# Patient Record
Sex: Male | Born: 1940 | Race: White | Hispanic: No | Marital: Married | Smoking: Never smoker
Health system: Southern US, Community
[De-identification: ages and names within clinical notes are randomized; demographics above are authoritative.]

## PROBLEM LIST (undated history)

## (undated) DIAGNOSIS — N179 Acute kidney failure, unspecified: Secondary | ICD-10-CM

## (undated) DIAGNOSIS — C189 Malignant neoplasm of colon, unspecified: Secondary | ICD-10-CM

## (undated) DIAGNOSIS — I10 Essential (primary) hypertension: Secondary | ICD-10-CM

## (undated) DIAGNOSIS — I4891 Unspecified atrial fibrillation: Secondary | ICD-10-CM

## (undated) DIAGNOSIS — F1010 Alcohol abuse, uncomplicated: Secondary | ICD-10-CM

## (undated) DIAGNOSIS — D539 Nutritional anemia, unspecified: Secondary | ICD-10-CM

## (undated) DIAGNOSIS — F32A Depression, unspecified: Secondary | ICD-10-CM

## (undated) DIAGNOSIS — E785 Hyperlipidemia, unspecified: Secondary | ICD-10-CM

## (undated) DIAGNOSIS — I509 Heart failure, unspecified: Secondary | ICD-10-CM

## (undated) HISTORY — DX: Acute kidney failure, unspecified: N17.9

## (undated) HISTORY — PX: KNEE SURGERY: SHX244

## (undated) HISTORY — DX: Malignant neoplasm of colon, unspecified: C18.9

## (undated) HISTORY — DX: Heart failure, unspecified: I50.9

---

## 2017-09-30 ENCOUNTER — Encounter: Payer: Self-pay | Admitting: Emergency Medicine

## 2017-09-30 ENCOUNTER — Ambulatory Visit (INDEPENDENT_AMBULATORY_CARE_PROVIDER_SITE_OTHER)
Admission: EM | Admit: 2017-09-30 | Discharge: 2017-09-30 | Disposition: A | Discharge status: Home / Self Care | Payer: Medicare (Managed Care) | Source: Home / Self Care | Attending: Family Medicine | Admitting: Family Medicine

## 2017-09-30 ENCOUNTER — Ambulatory Visit (INDEPENDENT_AMBULATORY_CARE_PROVIDER_SITE_OTHER): Payer: Medicare (Managed Care)

## 2017-09-30 ENCOUNTER — Emergency Department: Payer: Medicare (Managed Care)

## 2017-09-30 ENCOUNTER — Other Ambulatory Visit: Payer: Self-pay

## 2017-09-30 ENCOUNTER — Inpatient Hospital Stay
Admission: EM | Admit: 2017-09-30 | Discharge: 2017-10-02 | DRG: 308 | Disposition: A | Discharge status: Home / Self Care | Payer: Medicare (Managed Care) | Attending: Internal Medicine | Admitting: Internal Medicine

## 2017-09-30 DIAGNOSIS — I4891 Unspecified atrial fibrillation: Principal | ICD-10-CM | POA: Diagnosis present

## 2017-09-30 DIAGNOSIS — N179 Acute kidney failure, unspecified: Secondary | ICD-10-CM | POA: Diagnosis present

## 2017-09-30 DIAGNOSIS — Z8249 Family history of ischemic heart disease and other diseases of the circulatory system: Secondary | ICD-10-CM | POA: Diagnosis not present

## 2017-09-30 DIAGNOSIS — I472 Ventricular tachycardia: Secondary | ICD-10-CM | POA: Diagnosis present

## 2017-09-30 DIAGNOSIS — I493 Ventricular premature depolarization: Secondary | ICD-10-CM | POA: Diagnosis present

## 2017-09-30 DIAGNOSIS — I5023 Acute on chronic systolic (congestive) heart failure: Secondary | ICD-10-CM | POA: Insufficient documentation

## 2017-09-30 DIAGNOSIS — R791 Abnormal coagulation profile: Secondary | ICD-10-CM | POA: Diagnosis present

## 2017-09-30 DIAGNOSIS — I110 Hypertensive heart disease with heart failure: Secondary | ICD-10-CM | POA: Diagnosis present

## 2017-09-30 DIAGNOSIS — R202 Paresthesia of skin: Secondary | ICD-10-CM | POA: Diagnosis present

## 2017-09-30 DIAGNOSIS — E7800 Pure hypercholesterolemia, unspecified: Secondary | ICD-10-CM | POA: Diagnosis present

## 2017-09-30 DIAGNOSIS — E785 Hyperlipidemia, unspecified: Secondary | ICD-10-CM | POA: Diagnosis present

## 2017-09-30 DIAGNOSIS — J9811 Atelectasis: Secondary | ICD-10-CM | POA: Diagnosis present

## 2017-09-30 DIAGNOSIS — I5043 Acute on chronic combined systolic (congestive) and diastolic (congestive) heart failure: Secondary | ICD-10-CM | POA: Diagnosis present

## 2017-09-30 DIAGNOSIS — I10 Essential (primary) hypertension: Secondary | ICD-10-CM | POA: Diagnosis not present

## 2017-09-30 DIAGNOSIS — Z713 Dietary counseling and surveillance: Secondary | ICD-10-CM | POA: Diagnosis not present

## 2017-09-30 DIAGNOSIS — Z79899 Other long term (current) drug therapy: Secondary | ICD-10-CM | POA: Diagnosis not present

## 2017-09-30 DIAGNOSIS — Z6841 Body Mass Index (BMI) 40.0 and over, adult: Secondary | ICD-10-CM

## 2017-09-30 DIAGNOSIS — R0602 Shortness of breath: Secondary | ICD-10-CM

## 2017-09-30 DIAGNOSIS — I2510 Atherosclerotic heart disease of native coronary artery without angina pectoris: Secondary | ICD-10-CM | POA: Diagnosis present

## 2017-09-30 DIAGNOSIS — I700 Atherosclerosis of aorta: Secondary | ICD-10-CM | POA: Diagnosis present

## 2017-09-30 DIAGNOSIS — E662 Morbid (severe) obesity with alveolar hypoventilation: Secondary | ICD-10-CM | POA: Diagnosis present

## 2017-09-30 DIAGNOSIS — D539 Nutritional anemia, unspecified: Secondary | ICD-10-CM | POA: Diagnosis present

## 2017-09-30 DIAGNOSIS — E876 Hypokalemia: Secondary | ICD-10-CM | POA: Diagnosis present

## 2017-09-30 DIAGNOSIS — R739 Hyperglycemia, unspecified: Secondary | ICD-10-CM | POA: Diagnosis present

## 2017-09-30 DIAGNOSIS — F1010 Alcohol abuse, uncomplicated: Secondary | ICD-10-CM

## 2017-09-30 DIAGNOSIS — I509 Heart failure, unspecified: Secondary | ICD-10-CM

## 2017-09-30 DIAGNOSIS — I361 Nonrheumatic tricuspid (valve) insufficiency: Secondary | ICD-10-CM | POA: Diagnosis not present

## 2017-09-30 HISTORY — DX: Acute kidney failure, unspecified: N17.9

## 2017-09-30 HISTORY — DX: Alcohol abuse, uncomplicated: F10.10

## 2017-09-30 HISTORY — DX: Hyperlipidemia, unspecified: E78.5

## 2017-09-30 HISTORY — DX: Morbid (severe) obesity due to excess calories: E66.01

## 2017-09-30 HISTORY — DX: Essential (primary) hypertension: I10

## 2017-09-30 HISTORY — DX: Unspecified atrial fibrillation: I48.91

## 2017-09-30 HISTORY — DX: Nutritional anemia, unspecified: D53.9

## 2017-09-30 LAB — BASIC METABOLIC PANEL
ANION GAP: 11 (ref 5–15)
BUN: 20 mg/dL (ref 6–20)
CHLORIDE: 101 mmol/L (ref 101–111)
CO2: 22 mmol/L (ref 22–32)
Calcium: 8.4 mg/dL — ABNORMAL LOW (ref 8.9–10.3)
Creatinine, Ser: 1.32 mg/dL — ABNORMAL HIGH (ref 0.61–1.24)
GFR calc non Af Amer: 50 mL/min — ABNORMAL LOW (ref >60)
GFR, EST AFRICAN AMERICAN: 58 mL/min — AB (ref >60)
Glucose, Bld: 114 mg/dL — ABNORMAL HIGH (ref 65–99)
POTASSIUM: 3.9 mmol/L (ref 3.5–5.1)
Sodium: 134 mmol/L — ABNORMAL LOW (ref 135–145)

## 2017-09-30 LAB — CBC
HEMATOCRIT: 36.1 % — AB (ref 40.0–52.0)
HEMOGLOBIN: 12.4 g/dL — AB (ref 13.0–18.0)
MCH: 35.8 pg — AB (ref 26.0–34.0)
MCHC: 34.4 g/dL (ref 32.0–36.0)
MCV: 104.1 fL — AB (ref 80.0–100.0)
Platelets: 188 10*3/uL (ref 150–440)
RBC: 3.46 MIL/uL — ABNORMAL LOW (ref 4.40–5.90)
RDW: 16.6 % — ABNORMAL HIGH (ref 11.5–14.5)
WBC: 6.7 10*3/uL (ref 3.8–10.6)

## 2017-09-30 LAB — TSH: TSH: 3.81 u[IU]/mL (ref 0.350–4.500)

## 2017-09-30 LAB — BRAIN NATRIURETIC PEPTIDE: B Natriuretic Peptide: 1114 pg/mL — ABNORMAL HIGH (ref 0.0–100.0)

## 2017-09-30 LAB — TROPONIN I: Troponin I: <0.03 ng/mL (ref <0.03)

## 2017-09-30 LAB — T4, FREE: Free T4: 1.07 ng/dL (ref 0.61–1.12)

## 2017-09-30 LAB — FIBRIN DERIVATIVES D-DIMER (ARMC ONLY): FIBRIN DERIVATIVES D-DIMER (ARMC): 1425.47 ng{FEU}/mL — AB (ref 0.00–499.00)

## 2017-09-30 MED ORDER — MAGNESIUM SULFATE 2 GM/50ML IV SOLN
2.0000 g | Freq: Once | INTRAVENOUS | Status: AC
  Administered 2017-09-30: 2 g via INTRAVENOUS
  Filled 2017-09-30: qty 50

## 2017-09-30 MED ORDER — IOPAMIDOL (ISOVUE-370) INJECTION 76%
75.0000 mL | Freq: Once | INTRAVENOUS | Status: AC | PRN
  Administered 2017-09-30: 75 mL via INTRAVENOUS

## 2017-09-30 MED ORDER — FUROSEMIDE 10 MG/ML IJ SOLN
20.0000 mg | Freq: Once | INTRAMUSCULAR | Status: AC
  Administered 2017-09-30: 20 mg via INTRAVENOUS
  Filled 2017-09-30: qty 4

## 2017-09-30 MED ORDER — RIVAROXABAN 20 MG PO TABS
20.0000 mg | ORAL_TABLET | Freq: Once | ORAL | Status: AC
  Administered 2017-09-30: 20 mg via ORAL
  Filled 2017-09-30: qty 1

## 2017-09-30 MED ORDER — DILTIAZEM HCL ER COATED BEADS 180 MG PO CP24
180.0000 mg | ORAL_CAPSULE | Freq: Once | ORAL | Status: AC
  Administered 2017-09-30: 180 mg via ORAL
  Filled 2017-09-30 (×2): qty 1

## 2017-09-30 MED ORDER — DILTIAZEM HCL 25 MG/5ML IV SOLN
25.0000 mg | Freq: Once | INTRAVENOUS | Status: AC
  Administered 2017-09-30: 25 mg via INTRAVENOUS
  Filled 2017-09-30: qty 5

## 2017-09-30 NOTE — ED Notes (Signed)
Pt ambulatory to toilet without difficulty. 

## 2017-09-30 NOTE — ED Notes (Signed)
Patient transported to CT 

## 2017-09-30 NOTE — ED Provider Notes (Signed)
[facility] Emergency Department Provider Note  ____________________________________________  Time seen: Approximately 5:41 PM  I have reviewed the triage vital signs and the nursing notes.   HISTORY  Chief Complaint Shortness of Breath   HPI Michael Rowe is a 77 y.o. male the history of hypertension and hyperlipidemia who presents for evaluation of shortness of breath. Patient recently relocated to [facility] after spending several months traveling on an RV throughout the country. Over the last month he has had progressively worsening shortness of breath which has become severe over the last 3 days. He denies shortness of breath at rest but endorses that is now severe with minimal exertion. He has been gaining weight. He has had swelling in his lower extremities. He denies orthopnea and only sleeps with one pillow. He denies paroxysmal nocturnal dyspnea. He denies any chest pain. He has no history of smoking or heart attacks in the past. He denies any personal or family history of blood clots.  Past Medical History:  Diagnosis Date  . Arrhythmia   . High cholesterol   . Hypertension     There are no active problems to display for this patient.   Past Surgical History:  Procedure Laterality Date  . KNEE SURGERY      Prior to Admission medications   Medication Sig Start Date End Date Taking? Authorizing Provider  atorvastatin (LIPITOR) 40 MG tablet Take 40 mg by mouth daily.   Yes [provider]  losartan (COZAAR) 100 MG tablet Take 100 mg by mouth daily.   Yes [provider]  metoprolol succinate (TOPROL-XL) 50 MG 24 hr tablet Take 50 mg by mouth 2 (two) times daily. Take with or immediately following a meal.   Yes [provider]  PARoxetine (PAXIL) 20 MG tablet Take 20 mg by mouth daily.   Yes [provider]    Allergies Patient has no known allergies.  Family History  Problem Relation Age of Onset  .  Liver disease Mother   . Heart disease Father     Social History Social History   Tobacco Use  . Smoking status: Never Smoker  . Smokeless tobacco: Never Used  Substance Use Topics  . Alcohol use: Yes    Comment: 3-5 beer daily  . Drug use: No    Review of Systems  Constitutional: Negative for fever. Eyes: Negative for visual changes. ENT: Negative for sore throat. Neck: No neck pain  Cardiovascular: Negative for chest pain. Respiratory: + DOE Gastrointestinal: Negative for abdominal pain, vomiting or diarrhea. Genitourinary: Negative for dysuria. Musculoskeletal: Negative for back pain. + b/l leg swelling Skin: Negative for rash. Neurological: Negative for headaches, weakness or numbness. Psych: No SI or HI  ____________________________________________   PHYSICAL EXAM:  VITAL SIGNS: ED Triage Vitals [09/30/17 1610]  Enc Vitals Group     BP (!) 156/100     Pulse Rate 85     Resp (!) 22     Temp 98.3 F (36.8 C)     Temp Source Oral     SpO2 94 %     Weight      Height      Head Circumference      Peak Flow      Pain Score      Pain Loc      Pain Edu?      Excl. in GC?     Constitutional: Alert and oriented. Well appearing and in no apparent distress. HEENT:  Head: Normocephalic and atraumatic.         Eyes: Conjunctivae are normal. Sclera is non-icteric.       Mouth/Throat: Mucous membranes are moist.       Neck: Supple with no signs of meningismus. Cardiovascular: Irregularly irregular rhythm with tachycardic rate. No murmurs, gallops, or rubs. 2+ symmetrical distal pulses are present in all extremities. elevated JVD to earlobe Respiratory: increased respiratory rate, patient looks dyspneic but speaking in full sentences, has very distant breath sounds, no crackles or wheezing Gastrointestinal: Soft, non tender, and non distended with positive bowel sounds. No rebound or guarding. Genitourinary: No CVA tenderness. Musculoskeletal: 2+ pitting  edema bilateral lower extremities Neurologic: Normal speech and language. Face is symmetric. Moving all extremities. No gross focal neurologic deficits are appreciated. Skin: Skin is warm, dry and intact. No rash noted. Psychiatric: Mood and affect are normal. Speech and behavior are normal.  ____________________________________________   LABS (all labs ordered are listed, but only abnormal results are displayed)  Labs Reviewed  BASIC METABOLIC PANEL - Abnormal; Notable for the following components:      Result Value   Sodium 134 (*)    Glucose, Bld 114 (*)    Creatinine, Ser 1.32 (*)    Calcium 8.4 (*)    GFR calc non Af Amer 50 (*)    GFR calc Af Amer 58 (*)    All other components within normal limits  CBC - Abnormal; Notable for the following components:   RBC 3.46 (*)    Hemoglobin 12.4 (*)    HCT 36.1 (*)    MCV 104.1 (*)    MCH 35.8 (*)    RDW 16.6 (*)    All other components within normal limits  BRAIN NATRIURETIC PEPTIDE - Abnormal; Notable for the following components:   B Natriuretic Peptide 1,114.0 (*)    All other components within normal limits  FIBRIN DERIVATIVES D-DIMER ([facility] ONLY) - Abnormal; Notable for the following components:   Fibrin derivatives D-dimer [facility] 1,425.47 (*)    All other components within normal limits  TROPONIN I  T4, FREE  TSH   ____________________________________________  EKG  ED ECG REPORT I, Nita Sicklearolina Esly Selvage, the attending physician, personally viewed and interpreted this ECG.  Atrial fibrillation with a rate of 125, prolonged QTC at 528, normal axis, no ST elevations or depressions. No prior for comparison.  ____________________________________________  RADIOLOGY  CXR: Interpreted by me: CXR: Cardiomegaly and pulmonary edema  CTA: negative for PE, bilateral pleural effusions   Interpretation by Radiologist:  Dg Chest 2 View  Result Date: 09/30/2017 CLINICAL DATA:  Shortness of breath for 3 weeks, worsening for 3  days. EXAM: CHEST  2 VIEW COMPARISON:  None. FINDINGS: Cardiomegaly. There are [employer] Corporation[employer]ley lines and fissural thickening. No effusion or air bronchogram. No pneumothorax. Negative aortic and hilar contours. IMPRESSION: Cardiomegaly and mild interstitial opacity favoring CHF. Electronically Signed   By: Marnee SpringJonathon  Watts M.D.   On: 09/30/2017 15:06   Ct Angio Chest Pe W And/or Wo Contrast  Result Date: 09/30/2017 CLINICAL DATA:  Shortness of Breath EXAM: CT ANGIOGRAPHY CHEST WITH CONTRAST TECHNIQUE: Multidetector CT imaging of the chest was performed using the standard protocol during bolus administration of intravenous contrast. Multiplanar CT image reconstructions and MIPs were obtained to evaluate the vascular anatomy. CONTRAST:  75mL ISOVUE-370 IOPAMIDOL (ISOVUE-370) INJECTION 76% COMPARISON:  Chest x-ray 09/30/2017 FINDINGS: Cardiovascular: Cardiomegaly. Coronary artery calcifications and aortic calcifications. No evidence of aortic aneurysm. No filling defects in the pulmonary arteries  to suggest pulmonary emboli. Mediastinum/Nodes: No mediastinal, hilar, or axillary adenopathy. Lungs/Pleura: Dependent lower lobe atelectasis bilaterally. No confluent opacities. Small bilateral pleural effusions. Upper Abdomen: Imaging into the upper abdomen shows no acute findings. Musculoskeletal: Chest wall soft tissues are unremarkable. No acute bony abnormality. Review of the MIP images confirms the above findings. IMPRESSION: No evidence of pulmonary embolus. Small bilateral pleural effusions with dependent atelectasis in the lower lobes. Cardiomegaly.  Moderate coronary artery disease. Aortic Atherosclerosis (ICD10-I70.0). Electronically Signed   By: Charlett Nose M.D.   On: 09/30/2017 20:14     ____________________________________________   PROCEDURES  Procedure(s) performed: None Procedures Critical Care performed: yes  CRITICAL CARE Performed by: Nita Sickle  ?  Total critical care time: 40  min  Critical care time was exclusive of separately billable procedures and treating other patients.  Critical care was necessary to treat or prevent imminent or life-threatening deterioration.  Critical care was time spent personally by me on the following activities: development of treatment plan with patient and/or surrogate as well as nursing, discussions with consultants, evaluation of patient's response to treatment, examination of patient, obtaining history from patient or surrogate, ordering and performing treatments and interventions, ordering and review of laboratory studies, ordering and review of radiographic studies, pulse oximetry and re-evaluation of patient's condition.  ____________________________________________   INITIAL IMPRESSION / ASSESSMENT AND PLAN / ED COURSE   77 y.o. male the history of hypertension and hyperlipidemia who presents for evaluation of progressively worsening DOE x 3 days. patient looks volume overloaded with elevated JVD and bilateral 2+ pitting edema. He has very distant breath sounds with no history of COPD or prior history of smoking, his sats are normal but he does look dyspneic on exam with elevated respiratory rate. Chest x-ray is concerning for cardiomegaly and mild pulmonary edema. EKG showing a new atrial fibrillation with RVR. We'll start patient on Lasix and Cardizem. We'll check labs for electrolyte abnormalities, anemia, thyroid dysfunction, or blood clots since patient has been on several month RV trip with his family. If labs are within normal limits, patient does not show hypoxia with ambulation and I'm able to control his rate I anticipate discharge with close follow-up with cardiologist. Otherwise patient will be admitted to the hospitalist service.    _________________________ 8:59 PM on 09/30/2017 -----------------------------------------  labs showing no electrolyte abnormalities, normal thyroid studies, no anemia, normal troponin.  Chest x-ray concerning for cardiomegaly and mild pulmonary edema. CT angiogram was done due to elevated d-dimer, and negative for PE. Rate and sats normal at rest however with minimal ambulation dropped to 91% and HR 150s. Patient has received 20mg  IV lasix with great UOP, he has received 25 mg of IV cardizem and 180mg  XR cardizem, also has been started on Xarelto for ChadsVasc2 of 4. Will adit for further diuresis and ECHO to evaluate for new CHF.     As part of my medical decision making, I reviewed the following data within the electronic MEDICAL RECORD NUMBER History obtained from family, Nursing notes reviewed and incorporated, Labs reviewed , EKG interpreted , Radiograph reviewed , Discussed with admitting physician , Notes from prior ED visits and [place] Controlled Substance Database    Pertinent labs & imaging results that were available during my care of the patient were reviewed by me and considered in my medical decision making (see chart for details).    ____________________________________________   FINAL CLINICAL IMPRESSION(S) / ED DIAGNOSES  Final diagnoses:  Atrial fibrillation with RVR (HCC)  Acute on chronic congestive heart failure, unspecified heart failure type (HCC)      NEW MEDICATIONS STARTED DURING THIS VISIT:  ED Discharge Orders    None       Note:  This document was prepared using Dragon voice recognition software and may include unintentional dictation errors.    Nita Sickle, MD 09/30/17 2101

## 2017-09-30 NOTE — ED Triage Notes (Signed)
Pt reports 3 days of SOB with exertion, wife reports wheezing at night, and reports "I feel like I have no energy." Denies pain.

## 2017-09-30 NOTE — ED Provider Notes (Signed)
[facility]    CSN: [id number] Arrival date & time: 09/30/17  1340  History   Chief Complaint Chief Complaint  Patient presents with  . Shortness of Breath   HPI  77 year old male with HTN, HLD and reported Arrhythmia presents with SOB.  SOB  Has been going on for months.  Worse over the past 3 days.  Worse with exertion.  She reports that he is primarily short of breath with exertion.  Fatigue and decreased energy.  Wife reports that he seems to stop breathing at night.  She thinks he has sleep apnea.  Patient has a prior cardiac history with reports of arrhythmia.  When I mention atrial fibrillation he seems unaware.  He is not on any blood thinners at this time.  Patient is unsure of his medication.  I called his pharmacy and the only medication that is chronic that is on file is metoprolol.  Per pharmacy he takes metoprolol tartrate 50 mg twice daily.  Patient states that he takes cholesterol medication and a blood pressure medication.  He is unsure of the names and the doses.  He states that he has had prior stress testing and his wife alludes to him having a prior cardiac catheterization.  He has no documented history of congestive heart failure or coronary artery disease.  He denies any PND orthopnea.  His primary symptom is shortness of breath with exertion.  He has been gaining weight.  He states that he normally weighs around 230 pounds.  He currently weighs 270 pounds.  He has been traveling recently but states that he takes frequent stops.  He has recently relocated to our area.  No reports of chest pain.  He has no other complaints or concerns at this time.  Past Medical History:  Diagnosis Date  . Arrhythmia   . High cholesterol   . Hypertension    Surgical Hx - Total knee replacement.  Family History Family History  Problem Relation Age of Onset  . Liver disease Mother   . Heart disease Father    Social History Social History    Tobacco Use  . Smoking status: Never Smoker  . Smokeless tobacco: Never Used  Substance Use Topics  . Alcohol use: Yes    Comment: 3-5 beer daily  . Drug use: No    Allergies   Patient has no known allergies.   Review of Systems Review of Systems  Constitutional: Positive for fatigue and unexpected weight change.  Respiratory: Positive for shortness of breath.   Cardiovascular: Positive for leg swelling. Negative for chest pain.   Physical Exam Triage Vital Signs ED Triage Vitals  Enc Vitals Group     BP 09/30/17 1419 (!) 156/91     Pulse Rate 09/30/17 1419 (!) 104     Resp 09/30/17 1419 (!) 24     Temp 09/30/17 1419 98.1 F (36.7 C)     Temp Source 09/30/17 1419 Oral     SpO2 09/30/17 1419 93 %     Weight 09/30/17 1421 270 lb (122.5 kg)     Height 09/30/17 1421 5' 8.5" (1.74 m)     Head Circumference --      Peak Flow --      Pain Score --      Pain Loc --      Pain Edu? --      Excl. in GC? --    Updated Vital Signs BP (!) 156/91 (BP Location: Left Arm)  Pulse (!) 104   Temp 98.1 F (36.7 C) (Oral)   Resp (!) 24   Ht 5' 8.5" (1.74 m)   Wt 270 lb (122.5 kg)   SpO2 93%   BMI 40.46 kg/m     Physical Exam  Constitutional: He is oriented to person, place, and time. He appears well-developed and well-nourished.  Mild dyspnea.  HENT:  Head: Normocephalic and atraumatic.  Nose: Nose normal.  Eyes: Conjunctivae are normal. No scleral icterus.  Cardiovascular:  Irregularly irregular. 2+ ankle edema.  Pulmonary/Chest: Effort normal.  I cannot appreciate any adventitious sounds.  Abdominal: Soft. He exhibits no distension. There is no tenderness.  Neurological: He is alert and oriented to person, place, and time.  Psychiatric: He has a normal mood and affect. His behavior is normal.  Nursing note and vitals reviewed.  UC Treatments / Results  Labs (all labs ordered are listed, but only abnormal results are displayed) Labs Reviewed - No data to  display  EKG Interpretation: Atrial fibrillation with rapid ventricular response.  Rate 124. Nonspecific ST wave changes.  Radiology Dg Chest 2 View  Result Date: 09/30/2017 CLINICAL DATA:  Shortness of breath for 3 weeks, worsening for 3 days. EXAM: CHEST  2 VIEW COMPARISON:  None. FINDINGS: Cardiomegaly. There are [employer]ley lines and fissural thickening. No effusion or air bronchogram. No pneumothorax. Negative aortic and hilar contours. IMPRESSION: Cardiomegaly and mild interstitial opacity favoring CHF. Electronically Signed   By: Marnee SpringJonathon  Watts M.D.   On: 09/30/2017 15:06    Procedures Procedures (including critical care time)  Medications Ordered in UC Medications - No data to display   Initial Impression / Assessment and Plan / UC Course  I have reviewed the triage vital signs and the nursing notes.  Pertinent labs & imaging results that were available during my care of the patient were reviewed by me and considered in my medical decision making (see chart for details).     77 year old male presents with shortness of breath with exertion.  EKG revealed atrial fibrillation and chest x-ray revealed findings consistent with acute CHF.  Patient going directly to the ER.  Final Clinical Impressions(s) / UC Diagnoses   Final diagnoses:  Atrial fibrillation, unspecified type (HCC)  Acute congestive heart failure, unspecified heart failure type [facility](HCC)    ED Discharge Orders    None     Controlled Substance Prescriptions [place] Controlled Substance Registry consulted? Not Applicable   Tommie SamsCook, Shaya Reddick G, DO 09/30/17 [id number]

## 2017-09-30 NOTE — ED Notes (Signed)
EDP to bedside to provide pt update.

## 2017-09-30 NOTE — ED Triage Notes (Signed)
First Nurse Note:  [facility] Afib and CHF.

## 2017-09-30 NOTE — ED Notes (Signed)
Pharmacy notified to send Cardizem dose. 

## 2017-09-30 NOTE — ED Notes (Addendum)
Pt ambulated approximately 500 feet, saturation maintained 91-93% on room air; upon arrival back into room, pt labored, reports some dizziness, heart rate between 130's-150's.  EDP notified.

## 2017-09-30 NOTE — Discharge Instructions (Signed)
You are in atrial fibrillation and are experiencing symptoms/signs of CHF.  Go directly to hospital. [facility]  Take care  Dr. Adriana Simasook

## 2017-09-30 NOTE — H&P (Addendum)
[facility] - [place] at [facility]   PATIENT NAME: Michael Rowe    MR#:  [id number]  DATE OF BIRTH:  [birth year]  DATE OF ADMISSION:  09/30/2017  PRIMARY CARE PHYSICIAN: Patient, No Pcp Per   REQUESTING/REFERRING PHYSICIAN: Don PerkingVeronese, MD  CHIEF COMPLAINT:   Chief Complaint  Patient presents with  . Shortness of Breath    HISTORY OF PRESENT ILLNESS:  Michael Rowe  is a 77 y.o. male who presents with 2 days of progressive dyspnea on exertion.  He came to the ED today and was found to be in A. fib with RVR.  He had an elevated BNP and congestion/edema on chest x-ray.  Hospitalist were called for admission  PAST MEDICAL HISTORY:   Past Medical History:  Diagnosis Date  . Arrhythmia   . Atrial fibrillation (HCC)   . High cholesterol   . Hypertension     PAST SURGICAL HISTORY:   Past Surgical History:  Procedure Laterality Date  . KNEE SURGERY      SOCIAL HISTORY:   Social History   Tobacco Use  . Smoking status: Never Smoker  . Smokeless tobacco: Never Used  Substance Use Topics  . Alcohol use: Yes    Comment: 3-5 beer daily    FAMILY HISTORY:   Family History  Problem Relation Age of Onset  . Liver disease Mother   . Heart disease Father     DRUG ALLERGIES:  No Known Allergies  MEDICATIONS AT HOME:   Prior to Admission medications   Medication Sig Start Date End Date Taking? Authorizing Provider  atorvastatin (LIPITOR) 40 MG tablet Take 40 mg by mouth daily.   Yes [provider]  losartan (COZAAR) 100 MG tablet Take 100 mg by mouth daily.   Yes [provider]  metoprolol succinate (TOPROL-XL) 50 MG 24 hr tablet Take 50 mg by mouth 2 (two) times daily. Take with or immediately following a meal.   Yes [provider]  PARoxetine (PAXIL) 20 MG tablet Take 20 mg by mouth daily.   Yes [provider]    REVIEW OF SYSTEMS:  Review of Systems  Constitutional: Negative for chills, fever,  malaise/fatigue and weight loss.  HENT: Negative for ear pain, hearing loss and tinnitus.   Eyes: Negative for blurred vision, double vision, pain and redness.  Respiratory: Positive for shortness of breath. Negative for cough and hemoptysis.   Cardiovascular: Negative for chest pain, palpitations, orthopnea and leg swelling.  Gastrointestinal: Negative for abdominal pain, constipation, diarrhea, nausea and vomiting.  Genitourinary: Negative for dysuria, frequency and hematuria.  Musculoskeletal: Negative for back pain, joint pain and neck pain.  Skin:       No acne, rash, or lesions  Neurological: Negative for dizziness, tremors, focal weakness and weakness.  Endo/Heme/Allergies: Negative for polydipsia. Does not bruise/bleed easily.  Psychiatric/Behavioral: Negative for depression. The patient is not nervous/anxious and does not have insomnia.      VITAL SIGNS:   Vitals:   09/30/17 1930 09/30/17 2130 09/30/17 2200 09/30/17 2300  BP: (!) 138/91 (!) 134/91 124/75 131/87  Pulse: 87 100  95  Resp: 15 17 (!) 21 18  Temp:      TempSrc:      SpO2: 90% 93% 92% 98%   Wt Readings from Last 3 Encounters:  09/30/17 122.5 kg (270 lb)    PHYSICAL EXAMINATION:  Physical Exam  Vitals reviewed. Constitutional: He is oriented to person, place, and time. He appears well-developed and well-nourished.  No distress.  HENT:  Head: Normocephalic and atraumatic.  Mouth/Throat: Oropharynx is clear and moist.  Eyes: Conjunctivae and EOM are normal. Pupils are equal, round, and reactive to light. No scleral icterus.  Neck: Normal range of motion. Neck supple. No JVD present. No thyromegaly present.  Cardiovascular: Intact distal pulses. Exam reveals no gallop and no friction rub.  No murmur heard. Borderline tachycardic, irregular rhythm  Respiratory: Effort normal. No respiratory distress. He has no wheezes. He has rales.  GI: Soft. Bowel sounds are normal. He exhibits no distension. There is no  tenderness.  Musculoskeletal: Normal range of motion. He exhibits no edema.  No arthritis, no gout  Lymphadenopathy:    He has no cervical adenopathy.  Neurological: He is alert and oriented to person, place, and time. No cranial nerve deficit.  No dysarthria, no aphasia  Skin: Skin is warm and dry. No rash noted. No erythema.  Psychiatric: He has a normal mood and affect. His behavior is normal. Judgment and thought content normal.    LABORATORY PANEL:   CBC Recent Labs  Lab 09/30/17 1606  WBC 6.7  HGB 12.4*  HCT 36.1*  PLT 188   ------------------------------------------------------------------------------------------------------------------  Chemistries  Recent Labs  Lab 09/30/17 1606  NA 134*  K 3.9  CL 101  CO2 22  GLUCOSE 114*  BUN 20  CREATININE 1.32*  CALCIUM 8.4*   ------------------------------------------------------------------------------------------------------------------  Cardiac Enzymes Recent Labs  Lab 09/30/17 1606  TROPONINI <0.03   ------------------------------------------------------------------------------------------------------------------  RADIOLOGY:  Dg Chest 2 View  Result Date: 09/30/2017 CLINICAL DATA:  Shortness of breath for 3 weeks, worsening for 3 days. EXAM: CHEST  2 VIEW COMPARISON:  None. FINDINGS: Cardiomegaly. There are [employer] and fissural thickening. No effusion or air bronchogram. No pneumothorax. Negative aortic and hilar contours. IMPRESSION: Cardiomegaly and mild interstitial opacity favoring CHF. Electronically Signed   By: Marnee Spring M.D.   On: 09/30/2017 15:06   Ct Angio Chest Pe W And/or Wo Contrast  Result Date: 09/30/2017 CLINICAL DATA:  Shortness of Breath EXAM: CT ANGIOGRAPHY CHEST WITH CONTRAST TECHNIQUE: Multidetector CT imaging of the chest was performed using the standard protocol during bolus administration of intravenous contrast. Multiplanar CT image reconstructions and MIPs were obtained to  evaluate the vascular anatomy. CONTRAST:  75mL ISOVUE-370 IOPAMIDOL (ISOVUE-370) INJECTION 76% COMPARISON:  Chest x-ray 09/30/2017 FINDINGS: Cardiovascular: Cardiomegaly. Coronary artery calcifications and aortic calcifications. No evidence of aortic aneurysm. No filling defects in the pulmonary arteries to suggest pulmonary emboli. Mediastinum/Nodes: No mediastinal, hilar, or axillary adenopathy. Lungs/Pleura: Dependent lower lobe atelectasis bilaterally. No confluent opacities. Small bilateral pleural effusions. Upper Abdomen: Imaging into the upper abdomen shows no acute findings. Musculoskeletal: Chest wall soft tissues are unremarkable. No acute bony abnormality. Review of the MIP images confirms the above findings. IMPRESSION: No evidence of pulmonary embolus. Small bilateral pleural effusions with dependent atelectasis in the lower lobes. Cardiomegaly.  Moderate coronary artery disease. Aortic Atherosclerosis (ICD10-I70.0). Electronically Signed   By: Charlett Nose M.D.   On: 09/30/2017 20:14    EKG:   Orders placed or performed during the hospital encounter of 09/30/17  . ED EKG within 10 minutes  . ED EKG within 10 minutes  . EKG 12-Lead  . EKG 12-Lead    IMPRESSION AND PLAN:  Principal Problem:   Atrial fibrillation with RVR (HCC) -patient's heart rate is nearly controlled with IV and p.o. diltiazem in the ED.  We will give him his nighttime dose of metoprolol as he states that  he missed his morning dose.  Patient is not set up with cardiology in this area as he is recently moving here.  We will get an echocardiogram and a cardiology consult Active Problems:   Acute on chronic systolic CHF (congestive heart failure) (HCC) -IV Lasix given in the ED and patient has good urine output.  Continue home meds, cardiology consult and echocardiogram as above   AKI (acute kidney injury) (HCC) -unclear if this is all acute, or potentially chronic renal disease.  We do not have prior records for this  patient.  For now we will not administer fluids given the fact that we are diuresing him as above.  Will avoid nephrotoxins and monitor his renal function closely.   HTN (hypertension) -continue home meds   HLD (hyperlipidemia) -home dose antilipid  All the records are reviewed and case discussed with ED provider. Management plans discussed with the patient and/or family.  DVT PROPHYLAXIS: Systemic anticoagulation started in ED  GI PROPHYLAXIS: None  ADMISSION STATUS: Inpatient  CODE STATUS: Full Code Status History    This patient does not have a recorded code status. Please follow your organizational policy for patients in this situation.      TOTAL TIME TAKING CARE OF THIS PATIENT: 45 minutes.   Saniah Schroeter FIELDING 09/30/2017, 11:11 PM  [employer]  [phone]  CC: Primary care physician; Patient, No Pcp Per  Note:  This document was prepared using Dragon voice recognition software and may include unintentional dictation errors.

## 2017-09-30 NOTE — ED Triage Notes (Signed)
Pt with shortness of breath past few days, denies any chest pain.

## 2017-10-01 ENCOUNTER — Inpatient Hospital Stay (HOSPITAL_COMMUNITY)
Admit: 2017-10-01 | Discharge: 2017-10-01 | Disposition: A | Discharge status: Home / Self Care | Payer: Medicare (Managed Care) | Attending: Internal Medicine | Admitting: Internal Medicine

## 2017-10-01 ENCOUNTER — Other Ambulatory Visit: Payer: Self-pay

## 2017-10-01 ENCOUNTER — Encounter: Payer: Self-pay | Admitting: Physician Assistant

## 2017-10-01 DIAGNOSIS — I4891 Unspecified atrial fibrillation: Secondary | ICD-10-CM

## 2017-10-01 DIAGNOSIS — N179 Acute kidney failure, unspecified: Secondary | ICD-10-CM

## 2017-10-01 DIAGNOSIS — I10 Essential (primary) hypertension: Secondary | ICD-10-CM

## 2017-10-01 DIAGNOSIS — I509 Heart failure, unspecified: Secondary | ICD-10-CM

## 2017-10-01 DIAGNOSIS — I5023 Acute on chronic systolic (congestive) heart failure: Secondary | ICD-10-CM

## 2017-10-01 DIAGNOSIS — I361 Nonrheumatic tricuspid (valve) insufficiency: Secondary | ICD-10-CM

## 2017-10-01 DIAGNOSIS — F1010 Alcohol abuse, uncomplicated: Secondary | ICD-10-CM

## 2017-10-01 LAB — HEPATIC FUNCTION PANEL
ALBUMIN: 3.4 g/dL — AB (ref 3.5–5.0)
ALT: 6 U/L — AB (ref 17–63)
AST: 18 U/L (ref 15–41)
Alkaline Phosphatase: 43 U/L (ref 38–126)
Bilirubin, Direct: 0.3 mg/dL (ref 0.1–0.5)
Indirect Bilirubin: 1.5 mg/dL — ABNORMAL HIGH (ref 0.3–0.9)
Total Bilirubin: 1.8 mg/dL — ABNORMAL HIGH (ref 0.3–1.2)
Total Protein: 5.8 g/dL — ABNORMAL LOW (ref 6.5–8.1)

## 2017-10-01 LAB — HEMOGLOBIN A1C
HEMOGLOBIN A1C: 5.7 % — AB (ref 4.8–5.6)
Mean Plasma Glucose: 116.89 mg/dL

## 2017-10-01 LAB — CBC
HEMATOCRIT: 33.7 % — AB (ref 40.0–52.0)
HEMOGLOBIN: 11.5 g/dL — AB (ref 13.0–18.0)
MCH: 35.5 pg — AB (ref 26.0–34.0)
MCHC: 34 g/dL (ref 32.0–36.0)
MCV: 104.4 fL — AB (ref 80.0–100.0)
Platelets: 166 10*3/uL (ref 150–440)
RBC: 3.23 MIL/uL — AB (ref 4.40–5.90)
RDW: 16.9 % — ABNORMAL HIGH (ref 11.5–14.5)
WBC: 5.2 10*3/uL (ref 3.8–10.6)

## 2017-10-01 LAB — TROPONIN I
Troponin I: <0.03 ng/mL (ref <0.03)
Troponin I: <0.03 ng/mL (ref <0.03)

## 2017-10-01 LAB — BASIC METABOLIC PANEL
ANION GAP: 11 (ref 5–15)
BUN: 20 mg/dL (ref 6–20)
CHLORIDE: 100 mmol/L — AB (ref 101–111)
CO2: 23 mmol/L (ref 22–32)
Calcium: 8.2 mg/dL — ABNORMAL LOW (ref 8.9–10.3)
Creatinine, Ser: 1.26 mg/dL — ABNORMAL HIGH (ref 0.61–1.24)
GFR calc non Af Amer: 53 mL/min — ABNORMAL LOW (ref >60)
GLUCOSE: 107 mg/dL — AB (ref 65–99)
POTASSIUM: 3.4 mmol/L — AB (ref 3.5–5.1)
Sodium: 134 mmol/L — ABNORMAL LOW (ref 135–145)

## 2017-10-01 LAB — LIPID PANEL
Cholesterol: 139 mg/dL (ref 0–200)
HDL: 37 mg/dL — AB (ref >40)
LDL CALC: 81 mg/dL (ref 0–99)
TRIGLYCERIDES: 103 mg/dL (ref <150)
Total CHOL/HDL Ratio: 3.8 RATIO
VLDL: 21 mg/dL (ref 0–40)

## 2017-10-01 LAB — ECHOCARDIOGRAM COMPLETE
Height: 68.5 in
WEIGHTICAEL: 4337.6 [oz_av]

## 2017-10-01 LAB — PROTIME-INR
INR: 2.85
PROTHROMBIN TIME: 29.7 s — AB (ref 11.4–15.2)

## 2017-10-01 LAB — APTT: aPTT: 39 seconds — ABNORMAL HIGH (ref 24–36)

## 2017-10-01 LAB — MAGNESIUM: Magnesium: 1.6 mg/dL — ABNORMAL LOW (ref 1.7–2.4)

## 2017-10-01 MED ORDER — RIVAROXABAN 20 MG PO TABS
20.0000 mg | ORAL_TABLET | Freq: Every day | ORAL | Status: DC
  Administered 2017-10-01: 20 mg via ORAL
  Filled 2017-10-01: qty 1

## 2017-10-01 MED ORDER — ONDANSETRON HCL 4 MG/2ML IJ SOLN: 4.0000 mg | Freq: Four times a day (QID) | INTRAMUSCULAR | Status: DC | PRN

## 2017-10-01 MED ORDER — METOPROLOL SUCCINATE ER 25 MG PO TB24
75.0000 mg | ORAL_TABLET | Freq: Two times a day (BID) | ORAL | Status: DC
  Administered 2017-10-01 – 2017-10-02 (×3): 75 mg via ORAL
  Filled 2017-10-01 (×3): qty 3

## 2017-10-01 MED ORDER — ENOXAPARIN SODIUM 40 MG/0.4ML ~~LOC~~ SOLN: 40.0000 mg | SUBCUTANEOUS | Status: DC

## 2017-10-01 MED ORDER — ONDANSETRON HCL 4 MG PO TABS: 4.0000 mg | ORAL_TABLET | Freq: Four times a day (QID) | ORAL | Status: DC | PRN

## 2017-10-01 MED ORDER — POTASSIUM CHLORIDE CRYS ER 20 MEQ PO TBCR
20.0000 meq | EXTENDED_RELEASE_TABLET | Freq: Two times a day (BID) | ORAL | Status: AC
  Administered 2017-10-01 (×2): 20 meq via ORAL
  Filled 2017-10-01 (×2): qty 1

## 2017-10-01 MED ORDER — ACETAMINOPHEN 650 MG RE SUPP: 650.0000 mg | Freq: Four times a day (QID) | RECTAL | Status: DC | PRN

## 2017-10-01 MED ORDER — LOSARTAN POTASSIUM 50 MG PO TABS
100.0000 mg | ORAL_TABLET | Freq: Every day | ORAL | Status: DC
  Administered 2017-10-01 – 2017-10-02 (×2): 100 mg via ORAL
  Filled 2017-10-01 (×2): qty 2

## 2017-10-01 MED ORDER — POTASSIUM CHLORIDE CRYS ER 10 MEQ PO TBCR
10.0000 meq | EXTENDED_RELEASE_TABLET | Freq: Every day | ORAL | Status: DC
  Administered 2017-10-02: 10 meq via ORAL
  Filled 2017-10-01: qty 1

## 2017-10-01 MED ORDER — ACETAMINOPHEN 325 MG PO TABS
650.0000 mg | ORAL_TABLET | Freq: Four times a day (QID) | ORAL | Status: DC | PRN
Start: 2017-10-01 — End: 2017-10-02

## 2017-10-01 MED ORDER — PERFLUTREN LIPID MICROSPHERE
1.0000 mL | INTRAVENOUS | Status: AC | PRN
  Administered 2017-10-01: 4 mL via INTRAVENOUS
  Filled 2017-10-01: qty 10

## 2017-10-01 MED ORDER — MAGNESIUM SULFATE 2 GM/50ML IV SOLN
2.0000 g | Freq: Once | INTRAVENOUS | Status: AC
  Administered 2017-10-01: 2 g via INTRAVENOUS
  Filled 2017-10-01: qty 50

## 2017-10-01 MED ORDER — ATORVASTATIN CALCIUM 20 MG PO TABS
40.0000 mg | ORAL_TABLET | Freq: Every day | ORAL | Status: DC
  Administered 2017-10-01: 40 mg via ORAL
  Filled 2017-10-01: qty 2

## 2017-10-01 MED ORDER — FUROSEMIDE 10 MG/ML IJ SOLN
20.0000 mg | Freq: Every day | INTRAMUSCULAR | Status: DC
  Administered 2017-10-01 – 2017-10-02 (×2): 20 mg via INTRAVENOUS
  Filled 2017-10-01 (×2): qty 2

## 2017-10-01 MED ORDER — PAROXETINE HCL 20 MG PO TABS
20.0000 mg | ORAL_TABLET | Freq: Every day | ORAL | Status: DC
  Administered 2017-10-01 – 2017-10-02 (×2): 20 mg via ORAL
  Filled 2017-10-01 (×2): qty 1

## 2017-10-01 MED ORDER — METOPROLOL SUCCINATE ER 50 MG PO TB24
50.0000 mg | ORAL_TABLET | Freq: Two times a day (BID) | ORAL | Status: DC
  Administered 2017-10-01: 50 mg via ORAL
  Filled 2017-10-01: qty 1

## 2017-10-01 NOTE — Plan of Care (Signed)
Patient admitted about 1am without any complications. Patient remains in Afib on the monitor. No complaints of pain. Patient profile completed. Will continue to monitor and assess.

## 2017-10-01 NOTE — Consult Note (Signed)
Cardiology Consultation:   Patient ID: Michael Rowe; [id number]; 07-04-1941   Admit date: 09/30/2017 Date of Consult: 10/01/2017  Primary Care Provider: Patient, No Pcp Per Primary Cardiologist: New to [facility] - consult by [place]   Patient Profile:   Michael Rowe is a 77 y.o. male with a hx of patient reported Afib dating back ~ 15-20 years in [place], [place], HTN, HLD, morbid obesity, macrocytic anemia, and EtOH abuse drinking 3-5 beers and liquor daily who is being seen today for the evaluation of Afib with RVR at the request of Dr. Anne Hahn.  History of Present Illness:   Michael Rowe reports being diagnosed with Afib in [place], [place] as above approximately 15-20 years prior. No records for review. He is uncertain if he was placed on anticoagulation at that time. He has not seen a cardiologist since. He has been traveling the country in his RV with his wife lately and recently settled in the [place], [place] area as his kids live near by.   Over the past several months he has noted worsening exertional SOB that was somewhat limiting to him, though he was able to continue on with his daily activities. Approximately, 3-4 days prior to admission he developed worsening exertional SOB. He reports ambulation to his truck followed by climbing the one step to get in the truck has led to significant fatigue requiring him to rest in his truck prior to proceeding. Never with chest pain. No palpitations. Weight has increased by ~ 40 pounds over the past 6 months. He has noted lower extremity swelling, left > right. Also has noted bilateral lower extremity paresthesias. Because of his worsening SOB his daughter insisted he go to a local urgent care. There he was noted to be in Afib with RVR and sent to the hospital.    [facility] was noted to be in Afib with RVR with heart rates into the 120s to 140s bpm. BP elevated in the 150s/100s systolic that has since improved, HR 120s-140s bpm, temp 98.3,  oxygen saturation 94% on room air, weight 271 pounds. EKG showed Afib with RVR, 124 bpm, nonspecific st/t changes as outlined below, CXR showed cardiomegaly with mild interstitial congestion. Labs showed d-dimer 1,425 with CTA chest being negative for PE, though did show small bilateral pleural effusions, moderate CAD and aortic atherosclerosis. Troponin negative x 1 and not cycled, BNP 1,114, WBC 6.7, HGB 12.4, MCV 104.1, PLT 188, Na 134, K+ 3.9-->3.4, BUN/SCr 20/1.32-->20/1.26, glucose 114, TSH normal, LDL 81, Mg++ 1.6 after receiving IV magnesium. He was started on Xarelto 20 mg and given IV diltiazem followed by PO Cardizem 120 mg x 1. He was also given IV Lasix 20 mg x 1. Documented UOP of 200 mL for the admission to date. Heart rates have improved at rest to the 80s to 90s bpm. With ambulation around the nurse station he becomes tachycardic in the 120s bpm and is significantly SOB.  No prior records in Care Everywhere.   Past Medical History:  Diagnosis Date  . Alcohol abuse   . Atrial fibrillation (HCC)    a. patient reports being diagnosed with A fib ~ 15-20 years prior in [place], [place]; b. noted to be in Afib 1/19; c. CHADS2VASc => 4 (HTN, age x 2, vascular dosease)  . HLD (hyperlipidemia)   . Hypertension   . Macrocytic anemia   . Morbid obesity (HCC)     Past Surgical History:  Procedure Laterality Date  .  KNEE SURGERY       Home Meds: Prior to Admission medications   Medication Sig Start Date End Date Taking? Authorizing Provider  atorvastatin (LIPITOR) 40 MG tablet Take 40 mg by mouth daily.   Yes [provider]  losartan (COZAAR) 100 MG tablet Take 100 mg by mouth daily.   Yes [provider]  metoprolol succinate (TOPROL-XL) 50 MG 24 hr tablet Take 50 mg by mouth 2 (two) times daily. Take with or immediately following a meal.   Yes [provider]  PARoxetine (PAXIL) 20 MG tablet Take 20 mg by mouth daily.   Yes [provider]     Inpatient Medications: Scheduled Meds: . atorvastatin  40 mg Oral Daily  . losartan  100 mg Oral Daily  . metoprolol succinate  50 mg Oral BID  . PARoxetine  20 mg Oral Daily  . rivaroxaban  20 mg Oral Q supper   Continuous Infusions:  PRN Meds: acetaminophen **OR** acetaminophen, ondansetron **OR** ondansetron (ZOFRAN) IV  Allergies:  No Known Allergies  Social History:   Social History   Socioeconomic History  . Marital status: Married    Spouse name: Not on file  . Number of children: Not on file  . Years of education: Not on file  . Highest education level: Not on file  Social Needs  . Financial resource strain: Not on file  . Food insecurity - worry: Not on file  . Food insecurity - inability: Not on file  . Transportation needs - medical: Not on file  . Transportation needs - non-medical: Not on file  Occupational History  . Not on file  Tobacco Use  . Smoking status: Never Smoker  . Smokeless tobacco: Never Used  Substance and Sexual Activity  . Alcohol use: Yes    Comment: 3-5 beer daily  . Drug use: No  . Sexual activity: Not on file  Other Topics Concern  . Not on file  Social History Narrative  . Not on file     Family History:   Family History  Problem Relation Age of Onset  . Liver disease Mother   . Heart disease Father     ROS:  Review of Systems  Constitutional: Positive for malaise/fatigue. Negative for chills, diaphoresis, fever and weight loss.  HENT: Negative for congestion.   Eyes: Negative for discharge and redness.  Respiratory: Positive for shortness of breath. Negative for cough, hemoptysis, sputum production and wheezing.   Cardiovascular: Positive for leg swelling. Negative for chest pain, palpitations, orthopnea, claudication and PND.  Gastrointestinal: Negative for abdominal pain, blood in stool, heartburn, melena, nausea and vomiting.  Genitourinary: Negative for hematuria.  Musculoskeletal: Negative for falls and  myalgias.  Skin: Negative for rash.  Neurological: Positive for sensory change and weakness. Negative for dizziness, tingling, tremors, speech change, focal weakness and loss of consciousness.       Paresthesias bilateral lower extremities   Endo/Heme/Allergies: Does not bruise/bleed easily.  Psychiatric/Behavioral: Negative for substance abuse. The patient is not nervous/anxious.   All other systems reviewed and are negative.     Physical Exam/Data:   Vitals:   10/01/17 0045 10/01/17 0344 10/01/17 0612 10/01/17 0750  BP: 137/69 118/71 134/83 126/69  Pulse: 95 93 88 87  Resp: 18  17 18   Temp: 98 F (36.7 C) 97.6 F (36.4 C) 97.9 F (36.6 C) 97.7 F (36.5 C)  TempSrc: Oral Oral Oral Oral  SpO2: 94% 93% 96% 96%  Weight:   271  lb 1.6 oz (123 kg)     Intake/Output Summary (Last 24 hours) at 10/01/2017 0915 Last data filed at 10/01/2017 0330 Gross per 24 hour  Intake 50 ml  Output 250 ml  Net -200 ml   Filed Weights   10/01/17 0612  Weight: 271 lb 1.6 oz (123 kg)   Body mass index is 40.62 kg/m.   Physical Exam: General: Well developed, well nourished, in no acute distress. Head: Normocephalic, atraumatic, sclera non-icteric, no xanthomas, nares without discharge.  Neck: Negative for carotid bruits. JVD difficult to assess 2/2 body habitus. Lungs: Bibasilar crackles. Breathing is unlabored. Heart: Irregularly irregular, with S1 S2. No murmurs, rubs, or gallops appreciated. Abdomen: Soft, non-tender, non-distended with normoactive bowel sounds. No hepatomegaly. No rebound/guarding. No obvious abdominal masses. Msk:  Strength and tone appear normal for age. Extremities: No clubbing or cyanosis. 1+ bilateral pitting edema to the mid shins. Distal pedal pulses are 2+ and equal bilaterally. Neuro: Alert and oriented X 3. No facial asymmetry. No focal deficit. Moves all extremities spontaneously. Psych:  Responds to questions appropriately with a normal affect.   EKG:  The EKG  was personally reviewed and demonstrates: Afib with RVR, 124 bpm, nonspecific horizontal st depression V4, V5 Telemetry:  Telemetry was personally reviewed and demonstrates: Afib with heart rates improved to the 80s to 90s bpm, prior episodes of Afib with RVR into the 120s to 140s bpm, occasional PVCs in couplet formation, 7 beats NSVT  Weights: Filed Weights   10/01/17 0612  Weight: 271 lb 1.6 oz (123 kg)    Relevant CV Studies: TTE pending  Laboratory Data:  Chemistry Recent Labs  Lab 09/30/17 1606 10/01/17 0441  NA 134* 134*  K 3.9 3.4*  CL 101 100*  CO2 22 23  GLUCOSE 114* 107*  BUN 20 20  CREATININE 1.32* 1.26*  CALCIUM 8.4* 8.2*  GFRNONAA 50* 53*  GFRAA 58* >60  ANIONGAP 11 11    No results for input(s): PROT, ALBUMIN, AST, ALT, ALKPHOS, BILITOT in the last 168 hours. Hematology Recent Labs  Lab 09/30/17 1606 10/01/17 0441  WBC 6.7 5.2  RBC 3.46* 3.23*  HGB 12.4* 11.5*  HCT 36.1* 33.7*  MCV 104.1* 104.4*  MCH 35.8* 35.5*  MCHC 34.4 34.0  RDW 16.6* 16.9*  PLT 188 166   Cardiac Enzymes Recent Labs  Lab 09/30/17 1606 10/01/17 0441  TROPONINI <0.03 <0.03   No results for input(s): TROPIPOC in the last 168 hours.  BNP Recent Labs  Lab 09/30/17 1606  BNP 1,114.0*    DDimer No results for input(s): DDIMER in the last 168 hours.  Radiology/Studies:  Dg Chest 2 View  Result Date: 09/30/2017 IMPRESSION: Cardiomegaly and mild interstitial opacity favoring CHF. Electronically Signed   By: Marnee Spring M.D.   On: 09/30/2017 15:06   Ct Angio Chest Pe W And/or Wo Contrast  Result Date: 09/30/2017 IMPRESSION: No evidence of pulmonary embolus. Small bilateral pleural effusions with dependent atelectasis in the lower lobes. Cardiomegaly.  Moderate coronary artery disease. Aortic Atherosclerosis (ICD10-I70.0). Electronically Signed   By: Charlett Nose M.D.   On: 09/30/2017 20:14    Assessment and Plan:   1. Afib with RVR: -Unknown chronicity as he  reports being diagnosed with Afib ~ 15-20 years prior in [place], [place] -Uncertain if he was placed on anticoagulation at that time -No records for review in Epic or Care Everywhere -Rates are better controlled at rest, though still tachycardic with ambulation -He is also still symptomatically SOB  with ambulation -Increase Toprol XL to 75 mg bid -Consider addition of short-acting diltiazem 30 mg q 6 hours with hold parameters if rates remain tachycardic -If needed for BP room, could decrease ARB for added room for rate control -Would ideally like to avoid digoxin given AKI of unknown chronicity  -Would avoid amiodarone if at all possible given he is not fully anticoagulated  -CHADS2VASc at least 4 (HTN, age x 2, vascular disease), await echo and A1c -For now, continue Xarelto 20 mg q dinner -If found to have reduced EF, may need to transition to heparin gtt followed by ischemic evaluation (pending echo) -If ventricular rates are well controlled at rest and with ambulation and if he is asymptomatic with ambulation would pursue outpatient DCCV after he has been adequately anticoagulated -If his ventricular rates are difficult to control or if he remains symptomatic with ambulation would plan for TEE/DCCV prior to discharge -Ambulate today to assess for symptoms and heart rate control -Make NPO for possible TEE/DCCV as above on 1/31 if indicated   -Continue to cycle troponin to rule out  2. Acute CHF, type unknown: -Echo pending -Continues to appear volume up on exam with LE swelling and bibasilar crackles -IV Lasix 20 mg daily with KCl repletion and close monitoring of renal function -If EF is found to be reduced on echo he may require ischemic evaluation vs rate control for possible tachy-mediated cardiomyopathy -Toprol as above -Losartan -Daily weights -Strict Is and Os  3. NSVT: -Asymptomatic -Replete lytes as below -Increase Toprol to 75 mg bid  4. AKI: -Baseline unknown  5.  Hypokalemia/hypomagnesemia: -Replete to goal > 4.0 and > 2.0 respectively   6. Alcohol abuse: -Cessation advised -Likely playing a role in his hypomagnesemia  -Per IM  7. Macrocytic anemia: -Likely 2/2 #6 -HGB stable  8. Morbid obeisty/possible OSA/OHS: -Recommend outpatient sleep study  9. HTN: -Well controlled -Continue current medications as above  10. HLD: -Lipitor  11. Hyperglycemia: -Check A1c  12. LE paresthesias: -2+ bilateral pedal pulses -Per IM   For questions or updates, please contact [facility] Please consult [email or web address] for contact info under Cardiology/STEMI.   Signed, Eula Listen, PA-C [facility] Pager: [phone] 10/01/2017, 9:15 AM

## 2017-10-01 NOTE — Progress Notes (Signed)
*  PRELIMINARY RESULTS* Echocardiogram 2D Echocardiogram has been performed.  Cristela BlueHege, Teckla Christiansen 10/01/2017, 11:06 AM

## 2017-10-01 NOTE — Progress Notes (Signed)
ANTICOAGULATION CONSULT NOTE - Initial Consult  Pharmacy Consult for Rivaroxaban (no actual consult ordered) Indication: atrial fibrillation  No Known Allergies  Patient Measurements:   Heparin Dosing Weight: 98 kg  Vital Signs: Temp: 98 F (36.7 C) (01/30 0045) Temp Source: Oral (01/30 0045) BP: 137/69 (01/30 0045) Pulse Rate: 95 (01/30 0045)  Labs: Recent Labs    09/30/17 1606  HGB 12.4*  HCT 36.1*  PLT 188  CREATININE 1.32*  TROPONINI <0.03    Estimated Creatinine Clearance: 60.2 mL/min (A) (by C-G formula based on SCr of 1.32 mg/dL (H)).   Medical History: Past Medical History:  Diagnosis Date  . Arrhythmia   . Atrial fibrillation (HCC)   . High cholesterol   . Hypertension     Medications:  Scheduled:  . atorvastatin  40 mg Oral Daily  . losartan  100 mg Oral Daily  . metoprolol succinate  50 mg Oral BID  . PARoxetine  20 mg Oral Daily  . rivaroxaban  20 mg Oral Q supper    Assessment: Patient admitted for SOB found to be tachycardic, w/ elevated BNP 1140, Ddimer 1400. EKG shows afib w/ RVR w/ CHADS-VASc = 4. Patient is not on any anticoagulation PTA; however, was started on rivaroxaban in ED was given 20 mg PO x 1. Spoke w/ admitting doctor and willing to continue xarelto for anticoagulation for afib. CrCl 60.2 ml/min Baseline hgb/hct appears low, but stable.  Goal of Therapy:  Monitor platelets by anticoagulation protocol: Yes   Plan:  Will continue rivaroxaban 20 mg with supper for anticoagulation of afib. Baseline aptt, pt/inr drawn. Will continue to monitor daily CBC's Consider pharmacy consult to monitor  Thomasene Rippleavid Maryfrances Portugal, PharmD, BCPS Clinical Pharmacist 10/01/2017

## 2017-10-01 NOTE — Progress Notes (Signed)
77 year old male who presented to the ER with worsening SOB x 2 days. Upon arrival to the ED the patient was found to be in A-fib with RVR.  Pt's BNP was elevated at  1114.0 and had congestion/edema on CXR.  Patient is a never smoker.    Active problems this admission: 1. A-fib with RVR. 2. Acute on chronic systolic CHF.   3. AKI 4. Elevated INR unclear if related to Xarelto.   5. HTN 6. HLD  Echo pending.    CHF Education:?? Educational session with patient and wife completed.   ? "Living Better with Heart Failure" packet reviewed with patient and wife. Briefly reviewed definition of heart failure and signs and symptoms of an exacerbation.  Explained to patient that HF is a chronic illness which requires self-assessment / self-management along with help from the cardiologist/PCP/HF Clinic.?? ? *Reviewed importance of and reason behind checking weight daily in the AM, after using the bathroom, but before getting dressed.?Patient's wife reported she purchased scales today.  Encouraged patient to start weighing himself daily.  ? ? Reviewed the following information with patient:  *Discussed when to call the Dr= weight gain of >2-3lb overnight of 5lb in a week,  *Discussed yellow zone= call MD: weight gain of >2-3lb overnight of 5lb in a week, increased swelling, increased SOB when lying down, chest discomfort, dizziness, increased fatigue *Red Zone= call 911: struggle to breath, fainting or near fainting, significant chest pain  ? *Reviewed low sodium diet-provided handout of recommended and not recommended foods. ?Reviewed reading labels with patient. Discussed fluid intake with patient as well. Patient not currently on a fluid restriction, but advised no more than 8-8 ounces glass of fluids per day.  Note:  Dietitian Consult entered for diet education.  Wife would like to be present for education.  Patient and wife have been traveling in their RV and reported eating out most of the time  during their travels.  Patient openly admitted that he has been using more salt that he should be using.  Patient stated he plans to make some changes and reduce his sodium intake.    *Instructed patient to take medications as prescribed for heart failure. Explained briefly why pt is on the medications (either make you feel better, live longer or keep you out of the hospital) and discussed monitoring and side effects.   *Smoking Cessation - Patient is a NEVER smoker.  ?? ? *Exercise - Benefits of exercise discussed.  Encouraged patient to be as active as he can possibly be.    *[facility]- Explained the role of [facility]. ?Explained to patient and wife that the HF Clinic does not replace his PCP/cardiologist, but is an additional resource to help him manage his HF and to keep him out of the hospital. ?Patient has a new patient appointment in the HF Clinic on  10/10/2017 at 9:40 a.m.   Army Meliaiane Wright, RN, BSN, [facility] Nurse Navigator

## 2017-10-01 NOTE — Progress Notes (Signed)
Patient had a 7 beat run of vtach at 0834.  Patient also has low K and Mg.  Ryan Dunn notified and will put in orders.

## 2017-10-01 NOTE — Progress Notes (Signed)
[employer] [facility] [place] [facility]                                                                                                                                                                                  Patient Demographics   Michael FinlayGeorge Rowe, is a 77 y.o. male, DOB - 06-21-1941, RUE:[id number]  Admit date - 09/30/2017   Admitting Physician Oralia Manisavid Willis, MD  Outpatient Primary MD for the patient is Patient, No Pcp Per   LOS - 1  Subjective: Patient admitted with A. fib with RVR as well as congestive heart failure He is feeling much better heart rate improved  Review of Systems:   CONSTITUTIONAL: No documented fever. No fatigue, weakness. No weight gain, no weight loss.  EYES: No blurry or double vision.  ENT: No tinnitus. No postnasal drip. No redness of the oropharynx.  RESPIRATORY: No cough, no wheeze, no hemoptysis. No dyspnea.  CARDIOVASCULAR: No chest pain. No orthopnea. No palpitations. No syncope.  GASTROINTESTINAL: No nausea, no vomiting or diarrhea. No abdominal pain. No melena or hematochezia.  GENITOURINARY: No dysuria or hematuria.  ENDOCRINE: No polyuria or nocturia. No heat or cold intolerance.  HEMATOLOGY: No anemia. No bruising. No bleeding.  INTEGUMENTARY: No rashes. No lesions.  MUSCULOSKELETAL: No arthritis. No swelling. No gout.  NEUROLOGIC: No numbness, tingling, or ataxia. No seizure-type activity.  PSYCHIATRIC: No anxiety. No insomnia. No ADD.    Vitals:   Vitals:   10/01/17 0045 10/01/17 0344 10/01/17 0612 10/01/17 0750  BP: 137/69 118/71 134/83 126/69  Pulse: 95 93 88 87  Resp: 18  17 18   Temp: 98 F (36.7 C) 97.6 F (36.4 C) 97.9 F (36.6 C) 97.7 F (36.5 C)  TempSrc: Oral Oral Oral Oral  SpO2: 94% 93% 96% 96%  Weight:   271 lb 1.6 oz (123 kg)     Wt Readings from Last 3 Encounters:  10/01/17 271 lb 1.6 oz (123 kg)  09/30/17 270 lb (122.5 kg)     Intake/Output Summary (Last 24 hours) at 10/01/2017 1422 Last data  filed at 10/01/2017 1136 Gross per 24 hour  Intake 50 ml  Output 550 ml  Net -500 ml    Physical Exam:   GENERAL: Pleasant-appearing in no apparent distress.  HEAD, EYES, EARS, NOSE AND THROAT: Atraumatic, normocephalic. Extraocular muscles are intact. Pupils equal and reactive to light. Sclerae anicteric. No conjunctival injection. No oro-pharyngeal erythema.  NECK: Supple. There is no jugular venous distention. No bruits, no lymphadenopathy, no thyromegaly.  HEART: Irregularly irregular,. No murmurs, no rubs, no clicks.  LUNGS: C crackles at the base.  ABDOMEN: Soft, flat, nontender, nondistended. Has good bowel sounds. No  hepatosplenomegaly appreciated.  EXTREMITIES: No evidence of any cyanosis, clubbing, or peripheral 1+ edema.  +2 pedal and radial pulses bilaterally.  NEUROLOGIC: The patient is alert, awake, and oriented x3 with no focal motor or sensory deficits appreciated bilaterally.  SKIN: Moist and warm with no rashes appreciated.  Psych: Not anxious, depressed LN: No inguinal LN enlargement    Antibiotics   Anti-infectives (From admission, onward)   None      Medications   Scheduled Meds: . atorvastatin  40 mg Oral Daily  . furosemide  20 mg Intravenous Daily  . losartan  100 mg Oral Daily  . metoprolol succinate  75 mg Oral BID  . PARoxetine  20 mg Oral Daily  . [START ON 10/02/2017] potassium chloride  10 mEq Oral Daily  . potassium chloride  20 mEq Oral BID  . rivaroxaban  20 mg Oral Q supper   Continuous Infusions: PRN Meds:.acetaminophen **OR** acetaminophen, ondansetron **OR** ondansetron (ZOFRAN) IV   Data Review:   Micro Results No results found for this or any previous visit (from the past 240 hour(s)).  Radiology Reports Dg Chest 2 View  Result Date: 09/30/2017 CLINICAL DATA:  Shortness of breath for 3 weeks, worsening for 3 days. EXAM: CHEST  2 VIEW COMPARISON:  None. FINDINGS: Cardiomegaly. There are [employer] and fissural thickening. No  effusion or air bronchogram. No pneumothorax. Negative aortic and hilar contours. IMPRESSION: Cardiomegaly and mild interstitial opacity favoring CHF. Electronically Signed   By: Marnee Spring M.D.   On: 09/30/2017 15:06   Ct Angio Chest Pe W And/or Wo Contrast  Result Date: 09/30/2017 CLINICAL DATA:  Shortness of Breath EXAM: CT ANGIOGRAPHY CHEST WITH CONTRAST TECHNIQUE: Multidetector CT imaging of the chest was performed using the standard protocol during bolus administration of intravenous contrast. Multiplanar CT image reconstructions and MIPs were obtained to evaluate the vascular anatomy. CONTRAST:  75mL ISOVUE-370 IOPAMIDOL (ISOVUE-370) INJECTION 76% COMPARISON:  Chest x-ray 09/30/2017 FINDINGS: Cardiovascular: Cardiomegaly. Coronary artery calcifications and aortic calcifications. No evidence of aortic aneurysm. No filling defects in the pulmonary arteries to suggest pulmonary emboli. Mediastinum/Nodes: No mediastinal, hilar, or axillary adenopathy. Lungs/Pleura: Dependent lower lobe atelectasis bilaterally. No confluent opacities. Small bilateral pleural effusions. Upper Abdomen: Imaging into the upper abdomen shows no acute findings. Musculoskeletal: Chest wall soft tissues are unremarkable. No acute bony abnormality. Review of the MIP images confirms the above findings. IMPRESSION: No evidence of pulmonary embolus. Small bilateral pleural effusions with dependent atelectasis in the lower lobes. Cardiomegaly.  Moderate coronary artery disease. Aortic Atherosclerosis (ICD10-I70.0). Electronically Signed   By: Charlett Nose M.D.   On: 09/30/2017 20:14     CBC Recent Labs  Lab 09/30/17 1606 10/01/17 0441  WBC 6.7 5.2  HGB 12.4* 11.5*  HCT 36.1* 33.7*  PLT 188 166  MCV 104.1* 104.4*  MCH 35.8* 35.5*  MCHC 34.4 34.0  RDW 16.6* 16.9*    Chemistries  Recent Labs  Lab 09/30/17 1606 10/01/17 0441  NA 134* 134*  K 3.9 3.4*  CL 101 100*  CO2 22 23  GLUCOSE 114* 107*  BUN 20 20   CREATININE 1.32* 1.26*  CALCIUM 8.4* 8.2*  MG  --  1.6*   ------------------------------------------------------------------------------------------------------------------ estimated creatinine clearance is 63.2 mL/min (A) (by C-G formula based on SCr of 1.26 mg/dL (H)). ------------------------------------------------------------------------------------------------------------------ Recent Labs    10/01/17 0441  HGBA1C 5.7*   ------------------------------------------------------------------------------------------------------------------ Recent Labs    10/01/17 0441  CHOL 139  HDL 37*  LDLCALC 81  TRIG 161  CHOLHDL 3.8   ------------------------------------------------------------------------------------------------------------------ Recent Labs    09/30/17 1606  TSH 3.810   ------------------------------------------------------------------------------------------------------------------ No results for input(s): VITAMINB12, FOLATE, FERRITIN, TIBC, IRON, RETICCTPCT in the last 72 hours.  Coagulation profile Recent Labs  Lab 10/01/17 0441  INR 2.85    No results for input(s): DDIMER in the last 72 hours.  Cardiac Enzymes Recent Labs  Lab 09/30/17 1606 10/01/17 0441  TROPONINI <0.03 <0.03   ------------------------------------------------------------------------------------------------------------------ Invalid input(s): POCBNP    Assessment & Plan  Patient 77 year old admitted with A. fib with RVR and CHF   1. Atrial fibrillation with RVR (HCC) -p Continue therapy with oral Cardizem as well as IV Cardizem as needed Patient started on Xarelto Patient will likely need evaluation for coronary artery disease  2.  Acute on chronic systolic CHF continue therapy with IV Lasix echocardiogram of the heart pending  3.  AKI (acute kidney injury) (HCC) -unclear if this is all acute  or chronic renal function stable  4.  Elevated INR unclear if this is related to  dose of Xarelto or he has underlying coagulopathy his INR was not checked prior to initiation of Xarelto I will check liver function tests   5. HTN (hypertension) -continue metoprolol  6.   HLD (hyperlipidemia) -home dose antilipid         Code Status Orders  (From admission, onward)        Start     Ordered   10/01/17 0043  Full code  Continuous     10/01/17 0043    Code Status History    Date Active Date Inactive Code Status Order ID Comments User Context   This patient has a current code status but no historical code status.           Consults cardiology  DVT Prophylaxis xarelto  Lab Results  Component Value Date   PLT 166 10/01/2017     Time Spent in minutes   Greater than 50% of time spent in care coordination and counseling patient regarding the condition and plan of care.   Auburn Bilberry M.D on 10/01/2017 at 2:22 PM  Between 7am to 6pm - Pager - [phone]  After 6pm go to [email or web address] - password [email or web address] [facility]  [facility]   Office  [phone]

## 2017-10-02 ENCOUNTER — Telehealth: Payer: Self-pay | Admitting: *Deleted

## 2017-10-02 LAB — BASIC METABOLIC PANEL
Anion gap: 8 (ref 5–15)
BUN: 16 mg/dL (ref 6–20)
CHLORIDE: 103 mmol/L (ref 101–111)
CO2: 25 mmol/L (ref 22–32)
CREATININE: 1.14 mg/dL (ref 0.61–1.24)
Calcium: 8.5 mg/dL — ABNORMAL LOW (ref 8.9–10.3)
GFR calc Af Amer: >60 mL/min (ref >60)
GLUCOSE: 115 mg/dL — AB (ref 65–99)
POTASSIUM: 3.9 mmol/L (ref 3.5–5.1)
Sodium: 136 mmol/L (ref 135–145)

## 2017-10-02 LAB — PROTIME-INR
INR: 2.43
PROTHROMBIN TIME: 26.2 s — AB (ref 11.4–15.2)

## 2017-10-02 LAB — CBC
HEMATOCRIT: 34.9 % — AB (ref 40.0–52.0)
Hemoglobin: 11.7 g/dL — ABNORMAL LOW (ref 13.0–18.0)
MCH: 35 pg — ABNORMAL HIGH (ref 26.0–34.0)
MCHC: 33.4 g/dL (ref 32.0–36.0)
MCV: 104.9 fL — AB (ref 80.0–100.0)
Platelets: 179 10*3/uL (ref 150–440)
RBC: 3.33 MIL/uL — ABNORMAL LOW (ref 4.40–5.90)
RDW: 17 % — AB (ref 11.5–14.5)
WBC: 5.6 10*3/uL (ref 3.8–10.6)

## 2017-10-02 MED ORDER — DILTIAZEM HCL ER COATED BEADS 120 MG PO CP24: 120.0000 mg | ORAL_CAPSULE | Freq: Every day | ORAL | 0 refills | Status: DC

## 2017-10-02 MED ORDER — RIVAROXABAN 20 MG PO TABS: 20.0000 mg | ORAL_TABLET | Freq: Every day | ORAL | 1 refills | Status: DC

## 2017-10-02 MED ORDER — FUROSEMIDE 20 MG PO TABS: 20.0000 mg | ORAL_TABLET | Freq: Every day | ORAL | 11 refills | Status: DC

## 2017-10-02 MED ORDER — DILTIAZEM HCL ER COATED BEADS 120 MG PO CP24
120.0000 mg | ORAL_CAPSULE | Freq: Every day | ORAL | Status: DC
  Administered 2017-10-02: 120 mg via ORAL
  Filled 2017-10-02: qty 1

## 2017-10-02 MED ORDER — METOPROLOL TARTRATE 50 MG PO TABS: 75.0000 mg | ORAL_TABLET | Freq: Two times a day (BID) | ORAL | 0 refills | Status: DC

## 2017-10-02 NOTE — Plan of Care (Signed)
  Progressing Education: Knowledge of General Education information will improve 10/02/2017 0352 - Progressing by Dorna LeitzNesbitt, Lyndsay Talamante M, RN Education: Ability to verbalize understanding of medication therapies will improve 10/02/2017 0352 - Progressing by Dorna LeitzNesbitt, Kassi Esteve M, RN Education: Understanding of medication regimen will improve 10/02/2017 0352 - Progressing by Dorna LeitzNesbitt, Regie Bunner M, RN Activity: Ability to tolerate increased activity will improve 10/02/2017 0352 - Progressing by Dorna LeitzNesbitt, Rosbel Buckner M, RN Cardiac: Ability to achieve and maintain adequate cardiopulmonary perfusion will improve 10/02/2017 0352 - Progressing by Dorna LeitzNesbitt, Nathaneal Sommers M, RN

## 2017-10-02 NOTE — Progress Notes (Signed)
Nutrition Education Note  RD consulted for nutrition education regarding new onset CHF.  RD provided "Low Sodium Nutrition Therapy" handout from the Academy of Nutrition and Dietetics. Reviewed patient's dietary recall. Provided examples on ways to decrease sodium intake in diet. Discouraged intake of processed foods and use of salt shaker. Encouraged fresh fruits and vegetables as well as whole grain sources of carbohydrates to maximize fiber intake.   RD discussed why it is important for patient to adhere to diet recommendations, and emphasized the role of fluids, foods to avoid, and importance of weighing self daily. Teach back method used.  Expect good compliance.  Body mass index is 36.22 kg/m. Pt meets criteria for obesity based on current BMI.  Current diet order is HH, patient is consuming approximately 100% of meals at this time. Labs and medications reviewed. No further nutrition interventions warranted at this time. RD contact information provided. If additional nutrition issues arise, please re-consult RD.   Betsey Holidayasey Damien Cisar MS, RD, LDN Pager #- [phone] After Hours Pager: [phone]

## 2017-10-02 NOTE — Progress Notes (Signed)
PT Cancellation Note  Patient Details Name: Michael Rowe MRN: [id number] DOB: 18-Mar-1941   Cancelled Treatment:    Reason Eval/Treat Not Completed: Other (comment); Per nursing pt amb around the unit multiple times and nursing able to assess pt's vital sign response to activity.  Per nursing pt has no need for skilled PT services at this time.  Will complete orders and will reassess pt at a future date pending a change in status upon receipt of new PT orders.   Ovidio Hanger. Scott Maizey Menendez PT, DPT 10/02/17, 11:04 AM

## 2017-10-02 NOTE — Progress Notes (Signed)
Ambulated around the nursing station.  HR stayed below 115.  Paged Dr. Mariah MillingGollan and he is ok from cards to discharge home with wife.  Paged DR. Patel and he will do discharge.

## 2017-10-02 NOTE — Progress Notes (Signed)
Progress Note  Patient Name: Michael Rowe Date of Encounter: 10/02/2017  Primary Cardiologist: New to [facility] - consult by Mariah Milling  Subjective   Feels better. Less SOB. Ambulated in the room without issues. No chest pain. Able to lay fully supine. Remains in Afib improved ventricular rates overall in the 80s bpm. Occasional episodes of RVR into the 110s bpm. Echo showed normal EF with normal wall motion. Mildly dilated left atrium of 45 mm. Ruled out. Magnesium remains low at 1.6. A1c 5.7%. LDL 81. BMET pending. Tbili 1.8, iBili 1.5. BP well controlled. Documented UOP of 210 mL for the admission. Weight 271-->259 today.   Inpatient Medications    Scheduled Meds: . atorvastatin  40 mg Oral Daily  . furosemide  20 mg Intravenous Daily  . losartan  100 mg Oral Daily  . metoprolol succinate  75 mg Oral BID  . PARoxetine  20 mg Oral Daily  . potassium chloride  10 mEq Oral Daily  . rivaroxaban  20 mg Oral Q supper   Continuous Infusions:  PRN Meds: acetaminophen **OR** acetaminophen, ondansetron **OR** ondansetron (ZOFRAN) IV   Vital Signs    Vitals:   10/01/17 1621 10/01/17 1956 10/01/17 2139 10/02/17 0430  BP: 121/69 133/65  (!) 129/92  Pulse: 83 (!) 51 89 70  Resp: 18 18  20   Temp: 97.7 F (36.5 C) 98.3 F (36.8 C)  97.8 F (36.6 C)  TempSrc: Oral Oral  Oral  SpO2: 97% 93%  92%  Weight:    259 lb 11.2 oz (117.8 kg)    Intake/Output Summary (Last 24 hours) at 10/02/2017 0717 Last data filed at 10/01/2017 2031 Gross per 24 hour  Intake 290 ml  Output 300 ml  Net -10 ml   Filed Weights   10/01/17 0612 10/02/17 0430  Weight: 271 lb 1.6 oz (123 kg) 259 lb 11.2 oz (117.8 kg)    Telemetry    Afib, 80s to 110s bpm, occasional PVCs - Personally Reviewed  ECG    n/a - Personally Reviewed  Physical Exam   GEN: No acute distress.   Neck: JVD difficult to assess 2/2 body habitus. Cardiac: Irregularly irregular, no murmurs, rubs, or gallops.  Respiratory: Faint  bibasilar crackles, L>R.  GI: Soft, nontender, non-distended.   MS: Improved LE edema, now trace pre-tibial edema; No deformity. Neuro:  Alert and oriented x 3; Nonfocal.  Psych: Normal affect.  Labs    Chemistry Recent Labs  Lab 09/30/17 1606 10/01/17 0441  NA 134* 134*  K 3.9 3.4*  CL 101 100*  CO2 22 23  GLUCOSE 114* 107*  BUN 20 20  CREATININE 1.32* 1.26*  CALCIUM 8.4* 8.2*  PROT  --  5.8*  ALBUMIN  --  3.4*  AST  --  18  ALT  --  6*  ALKPHOS  --  43  BILITOT  --  1.8*  GFRNONAA 50* 53*  GFRAA 58* >60  ANIONGAP 11 11     Hematology Recent Labs  Lab 09/30/17 1606 10/01/17 0441  WBC 6.7 5.2  RBC 3.46* 3.23*  HGB 12.4* 11.5*  HCT 36.1* 33.7*  MCV 104.1* 104.4*  MCH 35.8* 35.5*  MCHC 34.4 34.0  RDW 16.6* 16.9*  PLT 188 166    Cardiac Enzymes Recent Labs  Lab 09/30/17 1606 10/01/17 0441 10/01/17 1418 10/01/17 2022  TROPONINI <0.03 <0.03 <0.03 <0.03   No results for input(s): TROPIPOC in the last 168 hours.   BNP Recent Labs  Lab 09/30/17  1606  BNP 1,114.0*     DDimer No results for input(s): DDIMER in the last 168 hours.   Radiology    Dg Chest 2 View  Result Date: 09/30/2017 IMPRESSION: Cardiomegaly and mild interstitial opacity favoring CHF. Electronically Signed   By: Marnee Spring M.D.   On: 09/30/2017 15:06   Ct Angio Chest Pe W And/or Wo Contrast  Result Date: 09/30/2017 IMPRESSION: No evidence of pulmonary embolus. Small bilateral pleural effusions with dependent atelectasis in the lower lobes. Cardiomegaly.  Moderate coronary artery disease. Aortic Atherosclerosis (ICD10-I70.0). Electronically Signed   By: Charlett Nose M.D.   On: 09/30/2017 20:14    Cardiac Studies   TTE 10/01/17: Study Conclusions  - Left ventricle: The cavity size was normal. Systolic function was   normal. The estimated ejection fraction was in the range of 60%   to 65%. Wall motion was normal; there were no regional wall   motion abnormalities. The  study is not technically sufficient to   allow evaluation of LV diastolic function. - Left atrium: The atrium was mildly dilated. - Right ventricle: Systolic function was normal. - Pulmonary arteries: Systolic pressure could not be accurately   estimated.  Impressions:  - Rhythm is atrial fibrillation.  Patient Profile     77 y.o. male with history of patient reported Afib dating back ~ 15-20 years in [place], [place], HTN, HLD, morbid obesity, macrocytic anemia, and EtOH abuse drinking 3-5 beers and liquor daily who is being seen today for the evaluation of Afib with RVR at the request of Dr. Anne Hahn.  Assessment & Plan    1. Afib with RVR: -Remains in Afib with overall improved ventricular rates in the 80s bpm with occasional tachycardic episodes in the 110s bpm -Continue Toprol XL 75 mg bid -Ambulate in the hallway today with PT/RN to assess for adequate heart rate control and symptom control -If heart rate and symptoms are well controlled with ambulation in the hallway he could likely be discharged later today with outpatient follow up and planned DCCV after he has been adequately anticoagulated x 3-4 weeks without interruption  -If ventricular rates are difficult to control or he is symptomatic with ambulation, escalate rate control and consider TEE/DCCV prior to discharge  -Continue Xarelto 20 mg q dinner -CHADS2VASc at least 4 (HTN, age x 2, vascular disease)  2. Acute diastolic CHF/pulmonary edema/LE swelling: -Volume status improving -Given IV Lasix 20 mg this AM followed by transition to PO Lasix, likely prn at discharge -Likely exacerbated by #1  3. NSVT: -No further episodes noted -Magnesium remains low -Toprol as above  4. AKI: -Bmet pending this AM  5. Hypomagnesemia/hypokalemia: -Continue IV repletion of magnesium to a goal > 2.0 -Await bmet and recommend repletion of potassium > 4.0 as indicated  6. Alcohol abuse: -Cessation advised  7. Macrocytic  anemia: -Likely 2/2 #6  8. Morbid obesity/possible OHS/OSA: -Weight loss advised -Needs outpatient sleep study  9. HLD: -Lipitor  10. Hyperglycemia: -A1c 5.7 -Follow up as outpatient    For questions or updates, please contact [facility] Please consult [email or web address] for contact info under Cardiology/STEMI.    Signed, Eula Listen, PA-C [facility] Pager: [phone] 10/02/2017, 7:17 AM   Attending Note Patient seen and examined, agree with detailed note above,  Patient presentation and plan discussed on rounds.   "I am going to stop drinking" "Going to that silver slippers program" Discussed with him that he probably meant silver sneakers Talked with wife he is currently  living in an RV while house is being built They have 2 dogs in the RV with him  Mild shortness of breath, improved compared to admission symptoms Ambulated with nursing in the hallway heart rate up to 120 Received losartan and metoprolol this morning  On physical exam obese, unable to estimate JVD, lungs clear scattered Rales right lower base, heart sounds irregularly irregular reasonable rate, abdomen obese soft nontender no significant lower extremity edema  Lab work reviewed personally by myself showing creatinine 1.14, potassium 3.9, hematocrit 35  Atrial fibrillation with RVR Timing unclear,  Suggest rate control for now with metoprolol 75 twice daily and diltiazem extended release 120 mg daily Xarelto has been started -CHADS2VASc at least 4 (HTN, age x 2, vascular disease),  He will need outpatient sleep study given periods of apnea per the wife Management of sleep disorder would be needed prior to attempt at cardioversion  Pulmonary edema  secondary to atrial fibrillation with RVR, high fluid intake, poor diet, sleep apnea Lasix this morning, additional dose after lunch  Alcohol abuse Recommend slow wean from alcohol son recently died in his 30s from alcoholism and  seizure He is willing to stop drinking  Anemia Likely secondary to chronic alcohol use  Sleep apnea Referral to pulmonary for outpatient sleep study  Long discussion with patient and his wife  All questions answered  greater than 50% was spent in counseling and coordination of care with patient Total encounter time 35 minutes or more   Signed: Dossie Arbourim Gollan  M.D., Ph.D. [facility]

## 2017-10-02 NOTE — Telephone Encounter (Signed)
Patient is currently admitted

## 2017-10-02 NOTE — Care Management (Signed)
Patient left unit without a xalrelto coupon.  CM called patient's pharmacy [facility] in [place] and provided with coupon information.  Free trial "went through."

## 2017-10-02 NOTE — Telephone Encounter (Signed)
-----   Message from Coralee RudSabrina F Gilley sent at 10/02/2017 10:17 AM EST ----- Regarding: tcm/ph 2/7 10:20 Dr. Mariah MillingGollan

## 2017-10-03 NOTE — Discharge Summary (Signed)
[facility] - [place] at [facility], 77 y.o., DOB Sep 18, 1940, MRN [id number]. Admission date: 09/30/2017 Discharge Date 10/03/2017 Primary MD Patient, No Pcp Per Admitting Physician Michael Manis, MD  Admission Diagnosis  Atrial fibrillation with RVR (HCC) [I48.91] Acute on chronic congestive heart failure, unspecified heart failure type (HCC) [I50.9]  Discharge Diagnosis    A. fib with RVR Acute on chronic systolic CHF Acute kidney injury Alcohol abuse Elevated INR Essential hypertension Hyperlipidemia       Hospital Course Mr. Rowe  is a 77 y.o. male who presents with 2 days of progressive dyspnea on exertion.  He came to the ED today and was found to be in A. fib with RVR.  He had an elevated BNP and congestion/edema on chest x-ray.  Hospitalist were called for admission.  Patient was noted to have A. fib with RVR and acute CHF.  He was treated with IV Lasix with significant improvement in his CHF symptoms.  He also was seen by cardiology and started on Cardizem and metoprolol was adjusted for his A. fib with RVR.  He was also started on anticoagulation.  Patient's heart rate is much improved.  He will need to follow-up with outpatient cardiology and likely do a stress test to evaluate for coronary artery disease.      Patient was strongly recommended to stop drinking         Consults cardiology  Significant Tests:  See full reports for all details    Dg Chest 2 View  Result Date: 09/30/2017 CLINICAL DATA:  Shortness of breath for 3 weeks, worsening for 3 days. EXAM: CHEST  2 VIEW COMPARISON:  None. FINDINGS: Cardiomegaly. There are [employer] and fissural thickening. No effusion or air bronchogram. No pneumothorax. Negative aortic and hilar contours. IMPRESSION: Cardiomegaly and mild interstitial opacity favoring CHF. Electronically Signed   By: Marnee Spring M.D.   On: 09/30/2017 15:06   Ct Angio Chest Pe W And/or Wo Contrast  Result  Date: 09/30/2017 CLINICAL DATA:  Shortness of Breath EXAM: CT ANGIOGRAPHY CHEST WITH CONTRAST TECHNIQUE: Multidetector CT imaging of the chest was performed using the standard protocol during bolus administration of intravenous contrast. Multiplanar CT image reconstructions and MIPs were obtained to evaluate the vascular anatomy. CONTRAST:  75mL ISOVUE-370 IOPAMIDOL (ISOVUE-370) INJECTION 76% COMPARISON:  Chest x-ray 09/30/2017 FINDINGS: Cardiovascular: Cardiomegaly. Coronary artery calcifications and aortic calcifications. No evidence of aortic aneurysm. No filling defects in the pulmonary arteries to suggest pulmonary emboli. Mediastinum/Nodes: No mediastinal, hilar, or axillary adenopathy. Lungs/Pleura: Dependent lower lobe atelectasis bilaterally. No confluent opacities. Small bilateral pleural effusions. Upper Abdomen: Imaging into the upper abdomen shows no acute findings. Musculoskeletal: Chest wall soft tissues are unremarkable. No acute bony abnormality. Review of the MIP images confirms the above findings. IMPRESSION: No evidence of pulmonary embolus. Small bilateral pleural effusions with dependent atelectasis in the lower lobes. Cardiomegaly.  Moderate coronary artery disease. Aortic Atherosclerosis (ICD10-I70.0). Electronically Signed   By: Charlett Nose M.D.   On: 09/30/2017 20:14       Today   Subjective:   Michael Rowe feels better wants to go home Objective:   Blood pressure 121/66, pulse 87, temperature 98 F (36.7 C), temperature source Oral, resp. rate 20, height 5\' 11"  (1.803 m), weight 259 lb 11.2 oz (117.8 kg), SpO2 96 %.  . No intake or output data in the 24 hours ending 10/03/17 1756  Exam VITAL SIGNS: Blood pressure 121/66, pulse 87, temperature 98 F (36.7  C), temperature source Oral, resp. rate 20, height 5\' 11"  (1.803 m), weight 259 lb 11.2 oz (117.8 kg), SpO2 96 %.  GENERAL:  77 y.o.-year-old patient lying in the bed with no acute distress.  EYES: Pupils equal, round,  reactive to light and accommodation. No scleral icterus. Extraocular muscles intact.  HEENT: Head atraumatic, normocephalic. Oropharynx and nasopharynx clear.  NECK:  Supple, no jugular venous distention. No thyroid enlargement, no tenderness.  LUNGS: Normal breath sounds bilaterally, no wheezing, rales,rhonchi or crepitation. No use of accessory muscles of respiration.  CARDIOVASCULAR: Irregularly irregular no murmurs, rubs, or gallops.  ABDOMEN: Soft, nontender, nondistended. Bowel sounds present. No organomegaly or mass.  EXTREMITIES: No pedal edema, cyanosis, or clubbing.  NEUROLOGIC: Cranial nerves II through XII are intact. Muscle strength 5/5 in all extremities. Sensation intact. Gait not checked.  PSYCHIATRIC: The patient is alert and oriented x 3.  SKIN: No obvious rash, lesion, or ulcer.   Data Review     CBC w Diff:  Lab Results  Component Value Date   WBC 5.6 10/02/2017   HGB 11.7 (L) 10/02/2017   HCT 34.9 (L) 10/02/2017   PLT 179 10/02/2017   CMP:  Lab Results  Component Value Date   NA 136 10/02/2017   K 3.9 10/02/2017   CL 103 10/02/2017   CO2 25 10/02/2017   BUN 16 10/02/2017   CREATININE 1.14 10/02/2017   PROT 5.8 (L) 10/01/2017   ALBUMIN 3.4 (L) 10/01/2017   BILITOT 1.8 (H) 10/01/2017   ALKPHOS 43 10/01/2017   AST 18 10/01/2017   ALT 6 (L) 10/01/2017  .  Micro Results No results found for this or any previous visit (from the past 240 hour(s)).   Code Status History    Date Active Date Inactive Code Status Order ID Comments User Context   10/01/2017 00:43 10/02/2017 18:50 Full Code [id number]  Michael ManisWillis, David, MD Inpatient          Follow-up Information    [facility] Follow up on 10/10/2017.   Specialty:  Cardiology Why:  at 9:40am Contact information: [address] [place] [id number] [phone]       Michael Rowe, Michael J, MD. Go on 10/09/2017.   Specialty:   Cardiology Why:  afib f/u, Appointment Time: 2:20pm Contact information: [address] [place] [address] [phone]        Shane Crutchamachandran, Pradeep, MD. Go on 10/16/2017.   Specialty:  Pulmonary Disease Why:  eval for sleep apnea, Appointment Time: 10:15AM PLEASE ARRIVE 15 MINS EARLY. Thanks! Contact information: [address] [place] [address] [phone]           Discharge Medications   Allergies as of 10/02/2017   No Known Allergies     Medication List    STOP taking these medications   losartan 100 MG tablet Commonly known as:  COZAAR   metoprolol succinate 50 MG 24 hr tablet Commonly known as:  TOPROL-XL     TAKE these medications   atorvastatin 40 MG tablet Commonly known as:  LIPITOR Take 40 mg by mouth daily.   diltiazem 120 MG 24 hr capsule Commonly known as:  CARDIZEM CD Take 1 capsule (120 mg total) by mouth daily.   furosemide 20 MG tablet Commonly known as:  LASIX Take 1 tablet (20 mg total) by mouth daily.   metoprolol tartrate 50 MG tablet Commonly known as:  LOPRESSOR Take 1.5 tablets (  75 mg total) by mouth 2 (two) times daily.   PARoxetine 20 MG tablet Commonly known as:  PAXIL Take 20 mg by mouth daily.   rivaroxaban 20 MG Tabs tablet Commonly known as:  XARELTO Take 1 tablet (20 mg total) by mouth daily with supper.          Total Time in preparing paper work, data evaluation and todays exam - 35 minutes  Auburn Bilberry M.D on 10/03/2017 at 5:56 PM  [facility]   Office  [phone]

## 2017-10-06 NOTE — Telephone Encounter (Signed)
No answer. Left message to call back.   

## 2017-10-07 NOTE — Progress Notes (Signed)
Cardiology Office Note  Date:  10/09/2017   ID:  Michael Rowe, DOB [birth year], MRN [id number]  PCP:  Patient, No Pcp Per   Chief Complaint  Patient presents with  . Other    [facility] follow up for afib. Meds reviewed verbally with patient.     HPI:  77 y.o. male with history of  Afib dating back ~ 15-20 years in [place], [place],  HTN,  HLD,  morbid obesity,  macrocytic anemia,  EtOH abuse drinking 3-5 beers and liquor daily  Recent hospitalization for Afib with RVR  and diastolic CHF Who presents for hospital follow-up to establish care in the [facility] office, for further management of his atrial fibrillation  Timing of onset of his atrial fibrillation unclear Presented to the hospital with 3-4 days of worsening shortness of breath and leg swelling Rate control with  metoprolol 75 twice daily and diltiazem extended release 120 mg daily Xarelto started -CHADS2VASc at least 4 (HTN, age x 2, vascular disease),  It was recommended that he have outpatient sleep study   For his pulmonary edema he was treated with Lasix Alcohol cessation recommended son recently died in his 30s from alcoholism and seizure He did have underlying anemia felt secondary to chronic alcohol use BNP in the hospital 1100  In follow-up reports that he feels better, shortness of breath improved but still not at his baseline.  No regular exercise program, sits around the house Wife reports he does not do much because he feels short of breath Had no exercise program prior to hospitalization Chronic lower extremity edema, stable  EKG personally reviewed by myself on todays visit Shows atrial fibrillation with ventricular rate 64 bpm no significant ST or T wave changes  PMH:   has a past medical history of Alcohol abuse, Atrial fibrillation (HCC), HLD (hyperlipidemia), Hypertension, Macrocytic anemia, and Morbid obesity (HCC).  PSH:    Past Surgical History:  Procedure Laterality Date  . KNEE SURGERY       Current Outpatient Medications  Medication Sig Dispense Refill  . atorvastatin (LIPITOR) 40 MG tablet Take 1 tablet (40 mg total) by mouth daily. 90 tablet 3  . diltiazem (CARDIZEM CD) 120 MG 24 hr capsule Take 1 capsule (120 mg total) by mouth daily. 30 capsule 0  . furosemide (LASIX) 20 MG tablet Take 1 tablet (20 mg total) by mouth daily. 90 tablet 3  . metoprolol tartrate (LOPRESSOR) 50 MG tablet Take 1 tablet (50 mg total) by mouth 2 (two) times daily. 60 tablet 6  . PARoxetine (PAXIL) 20 MG tablet Take 20 mg by mouth daily.    . rivaroxaban (XARELTO) 20 MG TABS tablet Take 1 tablet (20 mg total) by mouth daily with supper. 30 tablet 6   No current facility-administered medications for this visit.      Allergies:   Patient has no known allergies.   Social History:  The patient  reports that  has never smoked. he has never used smokeless tobacco. He reports that he does not drink alcohol or use drugs.   Family History:   family history includes Heart disease in his father; Liver disease in his mother.    Review of Systems: Review of Systems  Constitutional: Negative.   Respiratory: Negative.   Cardiovascular: Negative.   Gastrointestinal: Negative.   Musculoskeletal: Negative.   Neurological: Negative.   Psychiatric/Behavioral: Negative.   All other systems reviewed and are negative.    PHYSICAL EXAM: VS:  BP (!) 100/56 (BP Location: Left  Arm, Patient Position: Sitting, Cuff Size: Normal)   Pulse 64   Ht 5\' 10"  (1.778 m)   Wt 270 lb (122.5 kg)   BMI 38.74 kg/m  , BMI Body mass index is 38.74 kg/m. GEN: Well nourished, well developed, in no acute distress  HEENT: normal  Neck: no JVD, carotid bruits, or masses Cardiac: Irregularly irregular,  no murmurs, rubs, or gallops, trace to 1+ pitting edema to the mid shins Respiratory:  clear to auscultation bilaterally, normal work of breathing GI: soft, nontender, nondistended, + BS MS: no deformity or atrophy  Skin:  warm and dry, no rash Neuro:  Strength and sensation are intact Psych: euthymic mood, full affect    Recent Labs: 09/30/2017: B Natriuretic Peptide 1,114.0; TSH 3.810 10/01/2017: ALT 6; Magnesium 1.6 10/02/2017: BUN 16; Creatinine, Ser 1.14; Hemoglobin 11.7; Platelets 179; Potassium 3.9; Sodium 136    Lipid Panel Lab Results  Component Value Date   CHOL 139 10/01/2017   HDL 37 (L) 10/01/2017   LDLCALC 81 10/01/2017   TRIG 103 10/01/2017      Wt Readings from Last 3 Encounters:  10/09/17 270 lb (122.5 kg)  10/02/17 259 lb 11.2 oz (117.8 kg)  09/30/17 270 lb (122.5 kg)       ASSESSMENT AND PLAN:  Atrial fibrillation with RVR (HCC) - Plan: EKG 12-Lead Rate well controlled, we will decrease metoprolol down to 50 twice daily Stay on Xarelto  Alcohol abuse Recommended smoking cessation Long discussion concerning alcohol and risk of atrial fibrillation  Morbid obesity (HCC) We have encouraged continued exercise, careful diet management in an effort to lose weight.  Essential hypertension Blood pressure running low, he is having occasional episodes of orthostasis Recommend he decrease metoprolol down to 50 twice daily and monitor blood pressure at home.  If blood pressure continues to run low or he is symptomatic we would decrease metoprolol down to 25 twice daily  Mixed hyperlipidemia Recommend he stay on Lipitor 40 Goal LDL less than 70  Acute on chronic diastolic CHF (congestive heart failure) (HCC) Continue Lasix with low-dose potassium Recommended he monitor his weight, watch his salt intake  Disposition:   F/U  1 months With EKG, plan for cardioversion   Total encounter time more than 45 minutes  Greater than 50% was spent in counseling and coordination of care with the patient    Orders Placed This Encounter  Procedures  . EKG 12-Lead     Signed, Dossie Arbourim Duncan Alejandro, M.D., Ph.D. 10/09/2017  [facility], [place] [phone]

## 2017-10-07 NOTE — Telephone Encounter (Signed)
No answer. Left message to call back.   

## 2017-10-07 NOTE — Telephone Encounter (Signed)
No answer. Left message to call back if have any questions or concerns regarding upcoming appointment. Third attempt on TCM. Closing encounter.

## 2017-10-09 ENCOUNTER — Ambulatory Visit: Payer: Medicare (Managed Care) | Admitting: Cardiovascular Disease

## 2017-10-09 ENCOUNTER — Encounter: Payer: Self-pay | Admitting: Cardiovascular Disease

## 2017-10-09 VITALS — BP 100/56 | HR 64 | Ht 70.0 in | Wt 270.0 lb

## 2017-10-09 DIAGNOSIS — I4891 Unspecified atrial fibrillation: Secondary | ICD-10-CM | POA: Diagnosis not present

## 2017-10-09 DIAGNOSIS — E782 Mixed hyperlipidemia: Secondary | ICD-10-CM

## 2017-10-09 DIAGNOSIS — I10 Essential (primary) hypertension: Secondary | ICD-10-CM | POA: Diagnosis not present

## 2017-10-09 DIAGNOSIS — I5033 Acute on chronic diastolic (congestive) heart failure: Secondary | ICD-10-CM

## 2017-10-09 DIAGNOSIS — I5023 Acute on chronic systolic (congestive) heart failure: Secondary | ICD-10-CM

## 2017-10-09 DIAGNOSIS — F1010 Alcohol abuse, uncomplicated: Secondary | ICD-10-CM | POA: Diagnosis not present

## 2017-10-09 MED ORDER — FUROSEMIDE 20 MG PO TABS: 20.0000 mg | ORAL_TABLET | Freq: Every day | ORAL | 3 refills | Status: DC

## 2017-10-09 MED ORDER — RIVAROXABAN 20 MG PO TABS: 20.0000 mg | ORAL_TABLET | Freq: Every day | ORAL | 6 refills | Status: DC

## 2017-10-09 MED ORDER — ATORVASTATIN CALCIUM 40 MG PO TABS: 40.0000 mg | ORAL_TABLET | Freq: Every day | ORAL | 3 refills | Status: DC

## 2017-10-09 MED ORDER — METOPROLOL TARTRATE 50 MG PO TABS: 50.0000 mg | ORAL_TABLET | Freq: Two times a day (BID) | ORAL | 6 refills | Status: DC

## 2017-10-09 NOTE — Patient Instructions (Addendum)
Medication Instructions:   Please decrease the metoprolol down to one pill twice a day Start potassium 10 mEq daily  Monitor blood pressure  If blood pressure continues to run low (<110), Call the office  Medication Samples have been provided to the patient.  Drug name: Xarelto       Strength: 20 mg        Qty: 2 bottles  LOT: [id number]  Exp.Date: 2/21  Patient assistance forms signed by physician and reviewed with patient.  Labwork:  No new labs needed  Testing/Procedures:  No further testing at this time   Follow-Up: It was a pleasure seeing you in the office today. Please call us if you have new issues that need to be addressed before your next appt.  [phone]  Your physician wants you to follow-up in: 5 weeks  EKG at the end of Feb, nurse visit If still in atrial fibrillation, we would schedule a cardioversion  If you need a refill on your cardiac medications before your next appointment, please call your pharmacy.

## 2017-10-10 ENCOUNTER — Ambulatory Visit: Payer: Medicare (Managed Care) | Admitting: Family

## 2017-10-16 ENCOUNTER — Ambulatory Visit (INDEPENDENT_AMBULATORY_CARE_PROVIDER_SITE_OTHER): Payer: Medicare (Managed Care) | Admitting: Internal Medicine

## 2017-10-16 ENCOUNTER — Encounter: Payer: Self-pay | Admitting: Internal Medicine

## 2017-10-16 VITALS — BP 124/66 | HR 91 | Ht 70.0 in | Wt 256.0 lb

## 2017-10-16 DIAGNOSIS — G4719 Other hypersomnia: Secondary | ICD-10-CM

## 2017-10-16 NOTE — Progress Notes (Addendum)
[facility] Michael Rowe      Assessment and Plan:  Excessive daytime sleepiness. -Symptoms and signs of obstructive sleep apnea. - We will send for sleep study. Addendum 10/31/17:  Health Record No: [id number] on addendum note per insurance request **   This test is "medically necessary" as patient is driving on the road, and if he has sleep apnea he may be a hazard to himself or others. If he is diagnosed with sleep apnea he understands that he is not to drive if he is sleepy and until he is demonstrated to be adequately treated.       Congestive heart failure, atrial fibrillation with rapid ventricular rate, essential hypertension. - Obstructive sleep apnea can contribute to the above conditions, therefore treatment of the patient's sleep apnea is an important part of their management.  Orders Placed This Encounter  Procedures  . Split night study   Return in about 3 months (around 01/13/2018).    Date: 10/16/2017  MRN# [id number] Michael Rowe October 17, 1940  Referring Physician: Hospitalist physician.   Michael Rowe is a 77 y.o. old male seen in [place] for chief complaint of:    Chief Complaint  Patient presents with  . Hospitalization Follow-up  . sleep issues    can fall a sleep at anytime    HPI:   Patient is a 77 year old male recently admitted to the hospital with atrial fibrillation, RVR, acute CHF. His wife is present and provides the history, he "snores, gurgles, snorts and wheezes".  He hs tired during the day but he falls asleep easily while watching television.  No sleepwalking, no cataplexy.   Daughter has OSA and is on CPAP.    PMHX:   Past Medical History:  Diagnosis Date  . Alcohol abuse   . Atrial fibrillation (HCC)    a. patient reports being diagnosed with A fib ~ 15-20 years prior in [place], [place]; b. noted to be in Afib 1/19; c. CHADS2VASc => 4 (HTN, age x 2, vascular dosease)  . HLD (hyperlipidemia)   .  Hypertension   . Macrocytic anemia   . Morbid obesity (HCC)    Surgical Hx:  Past Surgical History:  Procedure Laterality Date  . KNEE SURGERY     Family Hx:  Family History  Problem Relation Age of Onset  . Liver disease Mother   . Heart disease Father    Social Hx:   Social History   Tobacco Use  . Smoking status: Never Smoker  . Smokeless tobacco: Never Used  Substance Use Topics  . Alcohol use: No    Frequency: Never    Comment: 3-5 beer daily  . Drug use: No   Medication:    Current Outpatient Medications:  .  atorvastatin (LIPITOR) 40 MG tablet, Take 1 tablet (40 mg total) by mouth daily., Disp: 90 tablet, Rfl: 3 .  diltiazem (CARDIZEM CD) 120 MG 24 hr capsule, Take 1 capsule (120 mg total) by mouth daily., Disp: 30 capsule, Rfl: 0 .  furosemide (LASIX) 20 MG tablet, Take 1 tablet (20 mg total) by mouth daily., Disp: 90 tablet, Rfl: 3 .  metoprolol tartrate (LOPRESSOR) 50 MG tablet, Take 1 tablet (50 mg total) by mouth 2 (two) times daily., Disp: 60 tablet, Rfl: 6 .  PARoxetine (PAXIL) 20 MG tablet, Take 20 mg by mouth daily., Disp: , Rfl:  .  rivaroxaban (XARELTO) 20 MG TABS tablet, Take 1 tablet (20 mg total) by mouth daily with supper., Disp: 30  tablet, Rfl: 6   Allergies:  Patient has no known allergies.  Review of Systems: Gen:  Denies  fever, sweats, chills HEENT: Denies blurred vision, double vision. bleeds, sore throat Cvc:  No dizziness, chest pain. Resp:   Denies cough or sputum production, shortness of breath Gi: Denies swallowing difficulty, stomach pain. Gu:  Denies bladder incontinence, burning urine Ext:   No Joint pain, stiffness. Skin: No skin rash,  hives  Endoc:  No polyuria, polydipsia. Psych: No depression, insomnia. Other:  All other systems were reviewed with the patient and were negative other that what is mentioned in the HPI.   Physical Examination:   VS: BP 124/66 (BP Location: Right Arm, Cuff Size: Normal)   Pulse 91   Ht 5'  10" (1.778 m)   Wt 256 lb (116.1 kg)   SpO2 95%   BMI 36.73 kg/m   General Appearance: No distress  Neuro:without focal findings,  speech normal,  HEENT: PERRLA, EOM intact.  Mallampati 3 Pulmonary: normal breath sounds, No wheezing.  CardiovascularNormal S1,S2.  No m/r/g.   Abdomen: Benign, Soft, non-tender. Renal:  No costovertebral tenderness  GU:  No performed at this time. Endoc: No evident thyromegaly, no signs of acromegaly. Skin:   warm, no rashes, no ecchymosis  Extremities: normal, no cyanosis, clubbing.  Other findings:    LABORATORY PANEL:   CBC No results for input(s): WBC, HGB, HCT, PLT in the last 168 hours. ------------------------------------------------------------------------------------------------------------------  Chemistries  No results for input(s): NA, K, CL, CO2, GLUCOSE, BUN, CREATININE, CALCIUM, MG, AST, ALT, ALKPHOS, BILITOT in the last 168 hours.  Invalid input(s): GFRCGP ------------------------------------------------------------------------------------------------------------------  Cardiac Enzymes No results for input(s): TROPONINI in the last 168 hours. ------------------------------------------------------------  RADIOLOGY:  No results found.     Thank  you for the Rowe and for allowing [facility] to assist in the care of your patient. Our recommendations are noted above.  Please contact us if we can be of further service.   [name], MD.  Board Certified in Internal Medicine, Pulmonary Medicine, Critical Care Medicine, and Sleep Medicine.  [facility] Office Number: [phone]  [name], M.D.  [name], M.D  10/16/2017

## 2017-10-16 NOTE — Patient Instructions (Signed)

## 2017-10-23 NOTE — Progress Notes (Signed)
Patient ID: Michael Rowe, male    DOB: Feb 05, 1941, 77 y.o.   MRN: [id number]  HPI  Michael Rowe is a 77 y/o male with a history of hyperlipidemia, HTN, atrial fibrillation, anemia, alcohol use and chronic heart failure.   Echo report from 10/01/17 reviewed and showed an EF of 60-65%.  Admitted 09/30/17 due to atrial fibrillation and heart failure exacerbation. Cardiology consult obtained. Initially needed IV diuretics and then transitioned to oral diuretics. Medications were adjusted for his atrial fibrillation and he was discharged after 2 days.   He presents today for his initial visit with a chief complaint of mild fatigue upon moderate exertion. He describes this as chronic in nature having been present for several months. Does feel like his energy level is improving though. He has associated edema and easy bruising along with this. He denies any chest pain, cough, shortness of breath, palpitations, abdominal distention, dizziness, difficulty sleeping or weight gain.   Past Medical History:  Diagnosis Date  . Alcohol abuse   . Atrial fibrillation (HCC)    a. patient reports being diagnosed with A fib ~ 15-20 years prior [place]; b. noted to be in Afib 1/19; c. CHADS2VASc => 4 (HTN, age x 2, vascular dosease)  . CHF (congestive heart failure) (HCC)   . HLD (hyperlipidemia)   . Hypertension   . Macrocytic anemia   . Morbid obesity (HCC)    Past Surgical History:  Procedure Laterality Date  . KNEE SURGERY     Family History  Problem Relation Age of Onset  . Liver disease Mother   . Heart disease Father   . CAD Brother   . Cancer Brother   . Cancer Brother   . Cancer Brother    Social History   Tobacco Use  . Smoking status: Never Smoker  . Smokeless tobacco: Never Used  Substance Use Topics  . Alcohol use: Yes    Alcohol/week: 2.4 - 3.0 oz    Types: 3 - 4 Cans of beer, 1 Shots of liquor per week    Frequency: Never    Comment: 3-5 beer daily 10/24/17 drinks 3-4  beers a week and a shot a week   No Known Allergies Prior to Admission medications   Medication Sig Start Date End Date Taking? Authorizing Provider  atorvastatin (LIPITOR) 40 MG tablet Take 1 tablet (40 mg total) by mouth daily. 10/09/17  Yes Gollan, Tollie Pizzaimothy J, MD  diltiazem (CARDIZEM CD) 120 MG 24 hr capsule Take 1 capsule (120 mg total) by mouth daily. 10/02/17  Yes Auburn BilberryPatel, Shreyang, MD  furosemide (LASIX) 20 MG tablet Take 1 tablet (20 mg total) by mouth daily. 10/09/17  Yes Gollan, Tollie Pizzaimothy J, MD  metoprolol tartrate (LOPRESSOR) 50 MG tablet Take 1 tablet (50 mg total) by mouth 2 (two) times daily. 10/09/17  Yes Gollan, Tollie Pizzaimothy J, MD  PARoxetine (PAXIL) 20 MG tablet Take 20 mg by mouth daily.   Yes [provider]  rivaroxaban (XARELTO) 20 MG TABS tablet Take 1 tablet (20 mg total) by mouth daily with supper. 10/09/17  Yes Antonieta IbaGollan, Timothy J, MD    Review of Systems  Constitutional: Positive for fatigue (getting better). Negative for appetite change.  HENT: Positive for congestion. Negative for rhinorrhea and sore throat.   Eyes: Negative.   Respiratory: Negative for cough, chest tightness and shortness of breath.   Cardiovascular: Positive for leg swelling (left ankle). Negative for chest pain and palpitations.  Gastrointestinal: Negative for abdominal distention  and abdominal pain.  Endocrine: Negative.   Genitourinary: Negative.   Musculoskeletal: Negative for back pain and neck pain.  Skin: Negative.   Allergic/Immunologic: Negative.   Neurological: Negative for dizziness and light-headedness.  Hematological: Negative for adenopathy. Bruises/bleeds easily.  Psychiatric/Behavioral: Negative for dysphoric mood and sleep disturbance. The patient is not nervous/anxious.     Vitals:   10/24/17 1019  BP: (!) 119/50  Pulse: 81  Resp: 18  SpO2: 99%  Weight: 258 lb 4 oz (117.1 kg)  Height: 5\' 10"  (1.778 m)   Wt Readings from Last 3 Encounters:  10/24/17 258 lb 4 oz (117.1 kg)   10/16/17 256 lb (116.1 kg)  10/09/17 270 lb (122.5 kg)   Lab Results  Component Value Date   CREATININE 1.14 10/02/2017   CREATININE 1.26 (H) 10/01/2017   CREATININE 1.32 (H) 09/30/2017    Physical Exam  Constitutional: He is oriented to person, place, and time. He appears well-developed and well-nourished.  HENT:  Head: Normocephalic and atraumatic.  Neck: Normal range of motion. Neck supple. No JVD present.  Cardiovascular: Normal rate. An irregularly irregular rhythm present.  Pulmonary/Chest: Effort normal. He has no wheezes. He has no rales.  Abdominal: Soft. He exhibits no distension. There is no tenderness.  Musculoskeletal: He exhibits edema (2+ pitting edema in bilateral lower legs). He exhibits no tenderness.  Neurological: He is alert and oriented to person, place, and time.  Skin: Skin is warm and dry.  Psychiatric: He has a normal mood and affect. His behavior is normal. Thought content normal.  Nursing note and vitals reviewed.   Assessment & Plan:  1: Chronic heart failure with preserved ejection fraction- - NYHA class II - mildly fluid overloaded today with edema in lower extremities - already weighing daily and he was instructed to call for an overnight weight gain of >2 pounds or a weekly weight gain of >5 pounds - not adding salt to his food and his wife has been reading food labels. Discussed the importance of keeping daily sodium intake to 2000mg  daily and written dietary information was given to him about this - drinking ~ 1 gallon of fluid daily which includes a lot of kool-aid. Discussed decreasing his fluid intake to closer to 60 ounces of fluid daily  (~ 1/2 gallon) and to check kool-aid envelope to see if it has sodium in it - encouraged him to elevate his legs when sitting for long periods of time and to get compression socks and start wearing them daily with removal of them at bedtime - BNP on 09/30/17 was 1114.0 - he does not get the flu vaccine;  encouraged good handwashing - PharmD reconciled medications with the patient  2: HTN- - BP looks good today - information given regarding PCP's in the community and he was encouraged to get established with one of them - BMP from 10/02/17 reviewed and showed sodium 136, potassium 3.9 and GFR >60  3: Atrial fibrillation- - saw cardiology Mariah Milling) 10/09/17 & returns March 2019 - continues on xarelto  4: Snoring- - saw pulmonologist Nicholos Johns) 10/16/17 - awaiting for the sleep lab to call to get an appointment scheduled  Patient did not bring his medications nor a list. Each medication was verbally reviewed with the patient and he was encouraged to bring the bottles to every visit to confirm accuracy of list.  Return in 2 months or sooner for any questions/problems before then.

## 2017-10-24 ENCOUNTER — Ambulatory Visit: Payer: Medicare (Managed Care) | Attending: Family | Admitting: Family

## 2017-10-24 ENCOUNTER — Encounter: Payer: Self-pay | Admitting: Family

## 2017-10-24 VITALS — BP 119/50 | HR 81 | Resp 18 | Ht 70.0 in | Wt 258.2 lb

## 2017-10-24 DIAGNOSIS — I4891 Unspecified atrial fibrillation: Secondary | ICD-10-CM

## 2017-10-24 DIAGNOSIS — D649 Anemia, unspecified: Secondary | ICD-10-CM | POA: Diagnosis not present

## 2017-10-24 DIAGNOSIS — R0683 Snoring: Secondary | ICD-10-CM | POA: Diagnosis not present

## 2017-10-24 DIAGNOSIS — I110 Hypertensive heart disease with heart failure: Secondary | ICD-10-CM | POA: Diagnosis not present

## 2017-10-24 DIAGNOSIS — Z79899 Other long term (current) drug therapy: Secondary | ICD-10-CM | POA: Insufficient documentation

## 2017-10-24 DIAGNOSIS — E785 Hyperlipidemia, unspecified: Secondary | ICD-10-CM | POA: Insufficient documentation

## 2017-10-24 DIAGNOSIS — Z8249 Family history of ischemic heart disease and other diseases of the circulatory system: Secondary | ICD-10-CM | POA: Diagnosis not present

## 2017-10-24 DIAGNOSIS — I5032 Chronic diastolic (congestive) heart failure: Secondary | ICD-10-CM

## 2017-10-24 DIAGNOSIS — Z7901 Long term (current) use of anticoagulants: Secondary | ICD-10-CM | POA: Diagnosis not present

## 2017-10-24 DIAGNOSIS — I10 Essential (primary) hypertension: Secondary | ICD-10-CM

## 2017-10-24 HISTORY — DX: Chronic diastolic (congestive) heart failure: I50.32

## 2017-10-24 NOTE — Patient Instructions (Addendum)
Continue weighing daily and call for an overnight weight gain of > 2 pounds or a weekly weight gain of >5 pounds.  Drink between 40-60 ounces of fluid daily.  

## 2017-10-27 ENCOUNTER — Telehealth: Payer: Self-pay | Admitting: Cardiovascular Disease

## 2017-10-27 ENCOUNTER — Ambulatory Visit (INDEPENDENT_AMBULATORY_CARE_PROVIDER_SITE_OTHER): Payer: Medicare (Managed Care) | Admitting: *Deleted

## 2017-10-27 VITALS — BP 121/84 | HR 77 | Ht 70.0 in | Wt 259.2 lb

## 2017-10-27 DIAGNOSIS — I4891 Unspecified atrial fibrillation: Secondary | ICD-10-CM | POA: Diagnosis not present

## 2017-10-27 DIAGNOSIS — Z01810 Encounter for preprocedural cardiovascular examination: Secondary | ICD-10-CM

## 2017-10-27 NOTE — Progress Notes (Signed)
1.) Reason for visit: EKG check  2.) Name of MD requesting visit: Dr Mariah MillingGollan*  3.) H&P: HTN, a. fib**  4.) ROS related to problem: Patient here for EKG check. Denies shortness of breath, chest pain, dizziness or swelling. BP 141/84, HR 78.  5.) Assessment and plan per MD: Dr Mariah MillingGollan advised for patient to be scheduled for cardioversion at his convenience and preprocedural lab work.  Patient scheduled for DCCV on 11/12/17 at 0730. [facility] 11/03/17. AVS given to patient.

## 2017-10-27 NOTE — Patient Instructions (Signed)
Medication Instructions:  Your physician recommends that you continue on your current medications as directed. Please refer to the Current Medication list given to you today.  DO NOT MISS ANY DOSES OF XARELTO BETWEEN NOW AND THE PROCEDURE.   Labwork: Your physician recommends that you return for lab work in: 1 WEEK AT THE [facility] ON November 03, 2017. - NO APPOINTMENT NEEDED.  - Please go to the [facility]. You will check in at the front desk to the right as you walk into the atrium. Valet Parking is offered if needed.    Testing/Procedures: Your physician has recommended that you have a Cardioversion (DCCV). Electrical Cardioversion uses a jolt of electricity to your heart either through paddles or wired patches attached to your chest. This is a controlled, usually prescheduled, procedure. Defibrillation is done under light anesthesia in the hospital, and you usually go home the day of the procedure. This is done to get your heart back into a normal rhythm. You are not awake for the procedure. Please see the instruction sheet given to you today.  You are scheduled for a Cardioversion on _MARCH 13, 2019____ with Dr.__GOLLAN__ Please arrive at the [facility] at _6:30__ a.m. on the day of your procedure.  DIET INSTRUCTIONS:  Nothing to eat or drink after midnight except your medications with a SMALL sip of water.         1) Labs: __ON 11/03/17 AT [facility] (CBC, BMET, PT/INR)___  2) Medications:  YOU MAY TAKE ALL of your remaining medications with a small amount of water.  3) Must have a responsible person to drive you home.  4) Bring a current list of your medications and current insurance cards.    If you have any questions after you get home, please call the office at [phone]   Follow-Up: Your physician recommends that you schedule a follow-up appointment TO BE DETERMINED. (usually 2 week to 1 month following procedure.)  If you need a refill on your  cardiac medications before your next appointment, please call your pharmacy.     Electrical Cardioversion Electrical cardioversion is the delivery of a jolt of electricity to restore a normal rhythm to the heart. A rhythm that is too fast or is not regular keeps the heart from pumping well. In this procedure, sticky patches or metal paddles are placed on the chest to deliver electricity to the heart from a device. This procedure may be done in an emergency if:  There is low or no blood pressure as a result of the heart rhythm.  Normal rhythm must be restored as fast as possible to protect the brain and heart from further damage.  It may save a life.  This procedure may also be done for irregular or fast heart rhythms that are not immediately life-threatening. Tell a health care provider about:  Any allergies you have.  All medicines you are taking, including vitamins, herbs, eye drops, creams, and over-the-counter medicines.  Any problems you or family members have had with anesthetic medicines.  Any blood disorders you have.  Any surgeries you have had.  Any medical conditions you have.  Whether you are pregnant or may be pregnant. What are the risks? Generally, this is a safe procedure. However, problems may occur, including:  Allergic reactions to medicines.  A blood clot that breaks free and travels to other parts of your body.  The possible return of an abnormal heart rhythm within hours or days after  the procedure.  Your heart stopping (cardiac arrest). This is rare.  What happens before the procedure? Medicines  Your health care provider may have you start taking: ? Blood-thinning medicines (anticoagulants) so your blood does not clot as easily. ? Medicines may be given to help stabilize your heart rate and rhythm.  Ask your health care provider about changing or stopping your regular medicines. This is especially important if you are taking diabetes medicines  or blood thinners. General instructions  Plan to have someone take you home from the hospital or clinic.  If you will be going home right after the procedure, plan to have someone with you for 24 hours.  Follow instructions from your health care provider about eating or drinking restrictions. What happens during the procedure?  To lower your risk of infection: ? Your health care team will wash or sanitize their hands. ? Your skin will be washed with soap.  An IV tube will be inserted into one of your veins.  You will be given a medicine to help you relax (sedative).  Sticky patches (electrodes) or metal paddles may be placed on your chest.  An electrical shock will be delivered. The procedure may vary among health care providers and hospitals. What happens after the procedure?  Your blood pressure, heart rate, breathing rate, and blood oxygen level will be monitored until the medicines you were given have worn off.  Do not drive for 24 hours if you were given a sedative.  Your heart rhythm will be watched to make sure it does not change. This information is not intended to replace advice given to you by your health care provider. Make sure you discuss any questions you have with your health care provider. Document Released: 08/09/2002 Document Revised: 04/17/2016 Document Reviewed: 02/23/2016 Elsevier Interactive Patient Education  2017 [employer]

## 2017-10-27 NOTE — Telephone Encounter (Signed)
Spoke with patient's wife and made aware that sleep study is 10/31/17 @ [facility]. She will get [facility]. Nothing otherwise needed.

## 2017-10-27 NOTE — Telephone Encounter (Signed)
Pt wife is calling, would  Like to know the name of the sleep center where pt sleep test is scheduled. Please advise.

## 2017-10-30 ENCOUNTER — Other Ambulatory Visit: Payer: Self-pay | Admitting: *Deleted

## 2017-10-30 DIAGNOSIS — I10 Essential (primary) hypertension: Secondary | ICD-10-CM

## 2017-10-30 DIAGNOSIS — I5032 Chronic diastolic (congestive) heart failure: Secondary | ICD-10-CM

## 2017-10-31 ENCOUNTER — Other Ambulatory Visit: Payer: Self-pay | Admitting: *Deleted

## 2017-10-31 MED ORDER — DILTIAZEM HCL ER COATED BEADS 120 MG PO CP24: 120.0000 mg | ORAL_CAPSULE | Freq: Every day | ORAL | 1 refills | Status: DC

## 2017-10-31 NOTE — Telephone Encounter (Signed)
Please advise if ok to refill Dr. Allena KatzPatel last to fill. Pt recently est. w / Mariah MillingGollan for cardiac needs.

## 2017-11-01 ENCOUNTER — Encounter: Payer: Self-pay | Admitting: *Deleted

## 2017-11-01 ENCOUNTER — Ambulatory Visit
Admission: EM | Admit: 2017-11-01 | Discharge: 2017-11-01 | Disposition: A | Discharge status: Home / Self Care | Payer: Medicare (Managed Care) | Attending: Family Medicine | Admitting: Family Medicine

## 2017-11-01 ENCOUNTER — Other Ambulatory Visit: Payer: Self-pay

## 2017-11-01 DIAGNOSIS — L84 Corns and callosities: Secondary | ICD-10-CM

## 2017-11-01 DIAGNOSIS — M79672 Pain in left foot: Secondary | ICD-10-CM | POA: Diagnosis not present

## 2017-11-01 NOTE — ED Triage Notes (Signed)
Patient started having left foot pain 3 days ago due to a CORN.

## 2017-11-01 NOTE — ED Provider Notes (Signed)
[facility]    CSN: [id number] Arrival date & time: 11/01/17  1310     History   Chief Complaint Chief Complaint  Patient presents with  . Foot Pain    HPI Michael Rowe is a 77 y.o. male.   77 yo male with a c/o left foot pain for 3 days over area of skin lesion on lateral aspect of foot. Denies any trauma, falls, injuries, fevers, chills, redness, drainage. States he has chronic swelling of his left lower leg and foot.     Foot Pain     Past Medical History:  Diagnosis Date  . Alcohol abuse   . Atrial fibrillation (HCC)    a. patient reports being diagnosed with A fib ~ 15-20 years prior [place]; b. noted to be in Afib 1/19; c. CHADS2VASc => 4 (HTN, age x 2, vascular dosease)  . CHF (congestive heart failure) (HCC)   . HLD (hyperlipidemia)   . Hypertension   . Macrocytic anemia   . Morbid obesity [facility](HCC)     Patient Active Problem List   Diagnosis Date Noted  . Chronic diastolic heart failure (HCC) 10/24/2017  . Snoring 10/24/2017  . Morbid obesity (HCC) 10/01/2017  . Alcohol abuse 10/01/2017  . HTN (hypertension) 09/30/2017  . HLD (hyperlipidemia) 09/30/2017  . Atrial fibrillation with RVR (HCC) 09/30/2017  . AKI (acute kidney injury) (HCC) 09/30/2017    Past Surgical History:  Procedure Laterality Date  . KNEE SURGERY         Home Medications    Prior to Admission medications   Medication Sig Start Date End Date Taking? Authorizing Provider  atorvastatin (LIPITOR) 40 MG tablet Take 1 tablet (40 mg total) by mouth daily. 10/09/17  Yes Gollan, Tollie Pizzaimothy J, MD  diltiazem (CARDIZEM CD) 120 MG 24 hr capsule Take 1 capsule (120 mg total) by mouth daily. 10/31/17  Yes Gollan, Tollie Pizzaimothy J, MD  furosemide (LASIX) 20 MG tablet Take 1 tablet (20 mg total) by mouth daily. 10/09/17  Yes Gollan, Tollie Pizzaimothy J, MD  rivaroxaban (XARELTO) 20 MG TABS tablet Take 1 tablet (20 mg total) by mouth daily with supper. 10/09/17  Yes Gollan, Tollie Pizzaimothy J, MD  metoprolol  tartrate (LOPRESSOR) 50 MG tablet Take 1 tablet (50 mg total) by mouth 2 (two) times daily. 10/09/17   Antonieta IbaGollan, Timothy J, MD  PARoxetine (PAXIL) 20 MG tablet Take 20 mg by mouth daily.    [provider]    Family History Family History  Problem Relation Age of Onset  . Liver disease Mother   . Heart disease Father   . CAD Brother   . Cancer Brother   . Cancer Brother   . Cancer Brother     Social History Social History   Tobacco Use  . Smoking status: Never Smoker  . Smokeless tobacco: Never Used  Substance Use Topics  . Alcohol use: Yes    Alcohol/week: 2.4 - 3.0 oz    Types: 3 - 4 Cans of beer, 1 Shots of liquor per week    Frequency: Never    Comment: 3-5 beer daily 10/24/17 drinks 3-4 beers a week and a shot a week  . Drug use: No     Allergies   Patient has no known allergies.   Review of Systems Review of Systems   Physical Exam Triage Vital Signs ED Triage Vitals  Enc Vitals Group     BP 11/01/17 1347 107/64     Pulse Rate 11/01/17  1347 61     Resp 11/01/17 1347 18     Temp 11/01/17 1347 98.5 F (36.9 C)     Temp Source 11/01/17 1347 Oral     SpO2 11/01/17 1347 96 %     Weight --      Height --      Head Circumference --      Peak Flow --      Pain Score 11/01/17 1348 0     Pain Loc --      Pain Edu? --      Excl. in GC? --    No data found.  Updated Vital Signs BP 107/64 (BP Location: Left Arm)   Pulse 61   Temp 98.5 F (36.9 C) (Oral)   Resp 18   SpO2 96%   Visual Acuity Right Eye Distance:   Left Eye Distance:   Bilateral Distance:    Right Eye Near:   Left Eye Near:    Bilateral Near:     Physical Exam  Constitutional: He appears well-developed and well-nourished. No distress.  Musculoskeletal:       Left foot: There is tenderness (over skin lesion on lateral foot (near 5th mtp joint)) and swelling (mild 1+ edema (chronic)). There is no bony tenderness and no laceration.  Skin: He is not diaphoretic.  Vitals  reviewed.    UC Treatments / Results  Labs (all labs ordered are listed, but only abnormal results are displayed) Labs Reviewed - No data to display  EKG  EKG Interpretation None       Radiology No results found.  Procedures Procedures (including critical care time)  Medications Ordered in UC Medications - No data to display   Initial Impression / Assessment and Plan / UC Course  I have reviewed the triage vital signs and the nursing notes.  Pertinent labs & imaging results that were available during my care of the patient were reviewed by me and considered in my medical decision making (see chart for details).       Final Clinical Impressions(s) / UC Diagnoses   Final diagnoses:  Callus of foot    ED Discharge Orders    None     1. diagnosis reviewed with patient 2. Recommend supportive treatment with otc analgesics prn; Dr. Margart Sickles corn/callus pads to relieve pressure 3. Follow-up with podiatrist 4. Follow up  prn if symptoms worsen or don't improve   Controlled Substance Prescriptions [place] Controlled Substance Registry consulted? Not Applicable   Payton Mccallum, MD 11/01/17 [phone]

## 2017-11-01 NOTE — Discharge Instructions (Signed)
Dr Jari SportsmanScholls pads for corns/callus Compression stockings

## 2017-11-03 ENCOUNTER — Other Ambulatory Visit
Admission: RE | Admit: 2017-11-03 | Discharge: 2017-11-03 | Disposition: A | Discharge status: Home / Self Care | Payer: Medicare (Managed Care) | Source: Ambulatory Visit | Attending: Cardiovascular Disease | Admitting: Cardiovascular Disease

## 2017-11-03 DIAGNOSIS — I4891 Unspecified atrial fibrillation: Secondary | ICD-10-CM | POA: Insufficient documentation

## 2017-11-03 DIAGNOSIS — Z01810 Encounter for preprocedural cardiovascular examination: Secondary | ICD-10-CM

## 2017-11-03 LAB — CBC WITH DIFFERENTIAL/PLATELET
BASOS ABS: 0 10*3/uL (ref 0–0.1)
BASOS PCT: 1 %
Eosinophils Absolute: 0.1 10*3/uL (ref 0–0.7)
Eosinophils Relative: 2 %
HEMATOCRIT: 37.1 % — AB (ref 40.0–52.0)
HEMOGLOBIN: 12.5 g/dL — AB (ref 13.0–18.0)
Lymphocytes Relative: 15 %
Lymphs Abs: 0.9 10*3/uL — ABNORMAL LOW (ref 1.0–3.6)
MCH: 34.5 pg — ABNORMAL HIGH (ref 26.0–34.0)
MCHC: 33.8 g/dL (ref 32.0–36.0)
MCV: 102.1 fL — ABNORMAL HIGH (ref 80.0–100.0)
MONOS PCT: 13 %
Monocytes Absolute: 0.7 10*3/uL (ref 0.2–1.0)
NEUTROS ABS: 4.1 10*3/uL (ref 1.4–6.5)
NEUTROS PCT: 69 %
Platelets: 242 10*3/uL (ref 150–440)
RBC: 3.63 MIL/uL — ABNORMAL LOW (ref 4.40–5.90)
RDW: 15.3 % — ABNORMAL HIGH (ref 11.5–14.5)
WBC: 5.9 10*3/uL (ref 3.8–10.6)

## 2017-11-03 LAB — BASIC METABOLIC PANEL
ANION GAP: 9 (ref 5–15)
BUN: 10 mg/dL (ref 6–20)
CHLORIDE: 105 mmol/L (ref 101–111)
CO2: 23 mmol/L (ref 22–32)
Calcium: 8.6 mg/dL — ABNORMAL LOW (ref 8.9–10.3)
Creatinine, Ser: 0.81 mg/dL (ref 0.61–1.24)
GFR calc non Af Amer: >60 mL/min (ref >60)
Glucose, Bld: 120 mg/dL — ABNORMAL HIGH (ref 65–99)
Potassium: 4.1 mmol/L (ref 3.5–5.1)
Sodium: 137 mmol/L (ref 135–145)

## 2017-11-03 LAB — PROTIME-INR
INR: 1.94
PROTHROMBIN TIME: 22 s — AB (ref 11.4–15.2)

## 2017-11-10 ENCOUNTER — Ambulatory Visit: Payer: Medicare (Managed Care) | Admitting: Cardiovascular Disease

## 2017-11-11 ENCOUNTER — Other Ambulatory Visit: Payer: Self-pay | Admitting: Cardiovascular Disease

## 2017-11-12 ENCOUNTER — Telehealth: Payer: Self-pay | Admitting: Cardiovascular Disease

## 2017-11-12 ENCOUNTER — Encounter
Admission: RE | Disposition: A | Discharge status: Home / Self Care | Payer: Self-pay | Source: Ambulatory Visit | Attending: Cardiovascular Disease

## 2017-11-12 SURGERY — CARDIOVERSION (CATH LAB)
Anesthesia: General

## 2017-11-12 NOTE — Telephone Encounter (Signed)
Pt retuning our call  Patient states he is also confirming up procedure.

## 2017-11-12 NOTE — Telephone Encounter (Signed)
Spoke with patients wife per release form and reviewed appointments, procedure, and instructions. She verbalized understanding and stated that they were not aware of today's appointment. He had visit with Dr. Pearson GrippeGollan, Tina NP, labs, EKG visit, and then 2 other appointments. Reassured her that it was understandable and we have him rescheduled to come in on Friday to have his DCCV done. She verbalized understanding of our conversation, agreement with plan, and had no further questions at this time.

## 2017-11-12 NOTE — Telephone Encounter (Signed)
Received letter from [employer] Patient assistance foundation and he does not qualify for assistance at this time due to insurance coverage.

## 2017-11-12 NOTE — Telephone Encounter (Signed)
Left voicemail message to call back  

## 2017-11-12 NOTE — Telephone Encounter (Signed)
Left voicemail message with procedure date, time, and location with instructions for nothing to eat or drink and need a driver. Also requested that he call back to confirm all of this information.

## 2017-11-13 ENCOUNTER — Other Ambulatory Visit: Payer: Self-pay | Admitting: Cardiovascular Disease

## 2017-11-14 ENCOUNTER — Ambulatory Visit: Payer: Medicare (Managed Care) | Admitting: Anesthesiology

## 2017-11-14 ENCOUNTER — Encounter
Admission: RE | Disposition: A | Discharge status: Home / Self Care | Payer: Self-pay | Source: Ambulatory Visit | Attending: Cardiovascular Disease

## 2017-11-14 ENCOUNTER — Ambulatory Visit
Admission: RE | Admit: 2017-11-14 | Discharge: 2017-11-14 | Disposition: A | Discharge status: Home / Self Care | Payer: Medicare (Managed Care) | Source: Ambulatory Visit | Attending: Cardiovascular Disease | Admitting: Cardiovascular Disease

## 2017-11-14 ENCOUNTER — Encounter: Payer: Self-pay | Admitting: Anesthesiology

## 2017-11-14 DIAGNOSIS — I110 Hypertensive heart disease with heart failure: Secondary | ICD-10-CM | POA: Diagnosis not present

## 2017-11-14 DIAGNOSIS — R238 Other skin changes: Secondary | ICD-10-CM

## 2017-11-14 DIAGNOSIS — I481 Persistent atrial fibrillation: Secondary | ICD-10-CM | POA: Insufficient documentation

## 2017-11-14 DIAGNOSIS — L539 Erythematous condition, unspecified: Secondary | ICD-10-CM

## 2017-11-14 DIAGNOSIS — I509 Heart failure, unspecified: Secondary | ICD-10-CM | POA: Insufficient documentation

## 2017-11-14 DIAGNOSIS — Z6837 Body mass index (BMI) 37.0-37.9, adult: Secondary | ICD-10-CM | POA: Diagnosis not present

## 2017-11-14 DIAGNOSIS — R609 Edema, unspecified: Secondary | ICD-10-CM

## 2017-11-14 HISTORY — PX: CARDIOVERSION: EP1203

## 2017-11-14 SURGERY — CARDIOVERSION (CATH LAB)
Anesthesia: General

## 2017-11-14 MED ORDER — PROPOFOL 10 MG/ML IV BOLUS
INTRAVENOUS | Status: AC
  Filled 2017-11-14: qty 20

## 2017-11-14 MED ORDER — SODIUM CHLORIDE 0.9 % IV SOLN
INTRAVENOUS | Status: DC
  Administered 2017-11-14: 08:00:00 via INTRAVENOUS

## 2017-11-14 MED ORDER — PROPOFOL 10 MG/ML IV BOLUS
INTRAVENOUS | Status: DC | PRN
  Administered 2017-11-14: 60 mg via INTRAVENOUS

## 2017-11-14 NOTE — CV Procedure (Signed)
Cardioversion procedure note For atrial fibrillation, persistent.  Procedure Details:  Consent: Risks of procedure as well as the alternatives and risks of each were explained to the (patient/caregiver). Consent for procedure obtained.  Time Out: Verified patient identification, verified procedure, site/side was marked, verified correct patient position, special equipment/implants available, medications/allergies/relevent history reviewed, required imaging and test results available. Performed  Patient placed on cardiac monitor, pulse oximetry, supplemental oxygen as necessary.  Sedation given: propofol IV, Dr. Karlton Lemonkarenz Pacer pads placed anterior and posterior chest.   Cardioverted 1 time(s).  Cardioverted at  200 J. Synchronized biphasic Converted to NSR   Evaluation: Findings: Post procedure EKG shows: NSR Complications: None Patient did tolerate procedure well.  Time Spent Directly with the Patient:  45 minutes   Michael Rowe, M.D., Ph.D.

## 2017-11-14 NOTE — Progress Notes (Signed)
Patient to go to ultrasound before discharge to evaluate LLE.

## 2017-11-14 NOTE — Transfer of Care (Signed)
Immediate Anesthesia Transfer of Care Note  Patient: Michael Rowe  Procedure(s) Performed: CARDIOVERSION (N/A )  Patient Location: Short Stay  Anesthesia Type:General  Level of Consciousness: awake  Airway & Oxygen Therapy: Patient connected to nasal cannula oxygen  Post-op Assessment: Post -op Vital signs reviewed and stable  Post vital signs: stable  Last Vitals:  Vitals:   11/14/17 0757 11/14/17 0758  BP:  133/71  Pulse: 73 72  Resp: (!) 21 12  Temp:    SpO2: 93% 95%    Last Pain:  Vitals:   11/14/17 0708  TempSrc: Oral  PainSc: 0-No pain         Complications: No apparent anesthesia complications

## 2017-11-14 NOTE — Anesthesia Preprocedure Evaluation (Signed)
Anesthesia Evaluation  Patient identified by MRN, date of birth, ID band Patient awake    Reviewed: Allergy & Precautions, H&P , NPO status , Patient's Chart, lab work & pertinent test results, reviewed documented beta blocker date and time   History of Anesthesia Complications Negative for: history of anesthetic complications  Airway Mallampati: III  TM Distance: >3 FB Neck ROM: full    Dental  (+) Dental Advidsory Given Flipper in place:   Pulmonary neg pulmonary ROS,           Cardiovascular Exercise Tolerance: Good hypertension, (-) angina+CHF  (-) CAD, (-) Past MI, (-) Cardiac Stents and (-) CABG + dysrhythmias Atrial Fibrillation (-) Valvular Problems/Murmurs     Neuro/Psych negative neurological ROS  negative psych ROS   GI/Hepatic negative GI ROS, Neg liver ROS,   Endo/Other  neg diabetesMorbid obesity  Renal/GU negative Renal ROS  negative genitourinary   Musculoskeletal   Abdominal   Peds  Hematology negative hematology ROS (+)   Anesthesia Other Findings Past Medical History: No date: Alcohol abuse No date: Atrial fibrillation (HCC)     Comment:  a. patient reports being diagnosed with A fib ~ 15-20               [place]; b. noted to be in Afib 1/19;               c. CHADS2VASc => 4 (HTN, age x 2, vascular dosease) No date: CHF (congestive heart failure) (HCC) No date: HLD (hyperlipidemia) No date: Hypertension No date: Macrocytic anemia No date: Morbid obesity (HCC)   Reproductive/Obstetrics negative OB ROS                             Anesthesia Physical Anesthesia Plan  ASA: III  Anesthesia Plan: General   Post-op Pain Management:    Induction: Intravenous  PONV Risk Score and Plan: Propofol infusion  Airway Management Planned: Nasal Cannula  Additional Equipment:   Intra-op Plan:   Post-operative Plan:   Informed Consent: I have  reviewed the patients History and Physical, chart, labs and discussed the procedure including the risks, benefits and alternatives for the proposed anesthesia with the patient or authorized representative who has indicated his/her understanding and acceptance.   Dental Advisory Given  Plan Discussed with: Anesthesiologist, CRNA and Surgeon  Anesthesia Plan Comments:         Anesthesia Quick Evaluation

## 2017-11-14 NOTE — OR Nursing (Signed)
Pt didn't take xeralto this am or any other medications. Dr Mariah Millinggollan notified, pt given Gibson RampXeralto home medication with small sip of water.

## 2017-11-14 NOTE — Progress Notes (Signed)
Patient and wife express concern for patient's LE swelling. Bilateral LE edema, right LE nonpitting and left LE 4+ pitting. Bilateral LEs warm to touch, DP pulses 1+. Paged and discussed with Dr Mariah MillingGollan, MD to come evaluate patient.

## 2017-11-14 NOTE — Anesthesia Postprocedure Evaluation (Signed)
Anesthesia Post Note  Patient: Michael LinerGeorge Michael Rowe  Procedure(s) Performed: CARDIOVERSION (N/A )  Patient location during evaluation: Cath Lab Anesthesia Type: General Level of consciousness: awake and alert Pain management: pain level controlled Vital Signs Assessment: post-procedure vital signs reviewed and stable Respiratory status: spontaneous breathing, nonlabored ventilation, respiratory function stable and patient connected to nasal cannula oxygen Cardiovascular status: blood pressure returned to baseline and stable Postop Assessment: no apparent nausea or vomiting Anesthetic complications: no     Last Vitals:  Vitals:   11/14/17 0815 11/14/17 0830  BP: 133/75 131/75  Pulse: 66 70  Resp: 20 19  Temp:    SpO2: 94% 94%    Last Pain:  Vitals:   11/14/17 0708  TempSrc: Oral  PainSc: 0-No pain                 Lenard SimmerAndrew Quashawn Jewkes

## 2017-11-14 NOTE — Anesthesia Post-op Follow-up Note (Signed)
Anesthesia QCDR form completed.        

## 2017-11-16 NOTE — H&P (Signed)
H&P Addendum, pre-cardioversion for atrial fibrillation  Patient was seen and evaluated prior to -cardioversion procedure Symptoms, prior testing details again confirmed with the patient Patient examined, no significant change from prior exam Lab work reviewed in detail personally by myself Patient understands risk and benefit of the procedure, willing to proceed  Signed, Tim Gollan, MD, Ph.D [facility]  

## 2017-11-18 ENCOUNTER — Telehealth: Payer: Self-pay | Admitting: Cardiovascular Disease

## 2017-11-18 NOTE — Telephone Encounter (Signed)
Spoke with patients wife per release form and she reports that she has page 4 but she was using a marker and it went through the paper and messed up the portion where Dr. Mariah MillingGollan had signed. Advised that I would print another page and have Dr. Mariah MillingGollan sign then call her back to pick it up. She was appreciative for the call back with no further questions at this time.

## 2017-11-18 NOTE — Telephone Encounter (Signed)
Patient wife Michael Rowe calling Patient is working on Patient Assistance Forms for [employer] and they are missing page 4 Need to speak with nurse about getting page possibly faxed Please call to discuss

## 2017-11-19 NOTE — Telephone Encounter (Signed)
Left detailed voicemail message that page 4 has been signed by physician and placed up front for them to pick up when possible and instructions to call back with any further questions.

## 2017-11-21 NOTE — Progress Notes (Signed)
Cardiology Office Note  Date:  11/24/2017   ID:  Michael Rowe, Michael Rowe April 20, 1941, MRN [id number]  PCP:  Patient, No Pcp Per   Chief Complaint  Patient presents with  . Other    5 week follow up. Patient c/o swelling in left ankle. Patient denies chest pain and SOB. Meds reviewed verbally with patient.     HPI:  77 y.o. male with history of  Afib dating back ~ 15-20 years in [place], [place],  HTN,  HLD,  morbid obesity,  macrocytic anemia,  EtOH abuse drinking 3-5 beers and liquor daily   hospitalization September 30, 2017 for Afib with RVR  and diastolic CHF Who presents for follow-up of his atrial fibrillation  Cardioversion on  11/14/2017 NSR restored Timing of onset of his atrial fibrillation unclear  He does report alcohol abuse issues likely triggering arrhythmia Chronic lower extremity edema  In follow-up today reports that his breathing is better Feels he is in normal rhythm Lower extremity swelling worse on the left than the right This is a chronic issue, wears compression hose Taking his Lasix daily, denies any significant change in his weight Working in his workshop  EKG personally reviewed by myself on todays visit Shows normal sinus rhythm with rate 81 bpm rare PVC no significant ST or T wave changes  Other past medical history reviewed Presented to the hospital September 30, 2017 with 3-4 days of worsening shortness of breath and leg swelling Rate control with  metoprolol 75 twice daily and diltiazem extended release 120 mg daily Xarelto started -CHADS2VASc at least 4 (HTN, age x 2, vascular disease),  It was recommended that he have outpatient sleep study   son  died in his 30s from alcoholism and seizure He did have underlying anemia felt secondary to chronic alcohol use BNP in the hospital 1100   PMH:   has a past medical history of Alcohol abuse, Atrial fibrillation (HCC), CHF (congestive heart failure) (HCC), HLD (hyperlipidemia), Hypertension,  Macrocytic anemia, and Morbid obesity (HCC).  PSH:    Past Surgical History:  Procedure Laterality Date  . CARDIOVERSION N/A 11/14/2017   Procedure: CARDIOVERSION;  Surgeon: Antonieta Iba, MD;  Location: [facility] ORS;  Service: Cardiovascular;  Laterality: N/A;  . KNEE SURGERY      Current Outpatient Medications  Medication Sig Dispense Refill  . atorvastatin (LIPITOR) 40 MG tablet Take 1 tablet (40 mg total) by mouth daily. 90 tablet 3  . diltiazem (CARDIZEM CD) 120 MG 24 hr capsule Take 1 capsule (120 mg total) by mouth daily. 30 capsule 1  . furosemide (LASIX) 20 MG tablet Take 1 tablet (20 mg total) by mouth daily. 90 tablet 3  . metoprolol tartrate (LOPRESSOR) 50 MG tablet Take 1 tablet (50 mg total) by mouth 2 (two) times daily. 60 tablet 6  . Multiple Vitamin (MULTIVITAMIN WITH MINERALS) TABS tablet Take 1 tablet by mouth daily. CENTRUM SILVER    . PARoxetine (PAXIL) 20 MG tablet Take 20 mg by mouth daily.    . rivaroxaban (XARELTO) 20 MG TABS tablet Take 1 tablet (20 mg total) by mouth daily with supper. (Patient taking differently: Take 20 mg by mouth daily. ) 30 tablet 6   No current facility-administered medications for this visit.      Allergies:   Patient has no known allergies.   Social History:  The patient  reports that he has never smoked. He has never used smokeless tobacco. He reports that he drinks about 2.4 -  3.0 oz of alcohol per week. He reports that he does not use drugs.   Family History:   family history includes CAD in his brother; Cancer in his brother, brother, and brother; Heart disease in his father; Liver disease in his mother.    Review of Systems: Review of Systems  Constitutional: Negative.   Respiratory: Negative.   Cardiovascular: Positive for leg swelling.  Gastrointestinal: Negative.   Musculoskeletal: Negative.   Neurological: Negative.   Psychiatric/Behavioral: Negative.   All other systems reviewed and are negative.    PHYSICAL  EXAM: VS:  BP 120/64 (BP Location: Left Arm, Patient Position: Sitting, Cuff Size: Normal)   Pulse 81   Ht 5\' 10"  (1.778 m)   Wt 259 lb (117.5 kg)   BMI 37.16 kg/m  , BMI Body mass index is 37.16 kg/m. Constitutional:  oriented to person, place, and time. No distress. Obese HENT:  Head: Normocephalic and atraumatic.  Eyes:  no discharge. No scleral icterus.  Neck: Normal range of motion. Neck supple. No JVD present.  Cardiovascular: Normal rate, regular rhythm, normal heart sounds and intact distal pulses. Exam reveals no gallop and no friction rub.  1+ pitting edema left lower extremity mid shin down trace on the right, chronic issue No murmur heard. Pulmonary/Chest: Effort normal and breath sounds normal. No stridor. No respiratory distress.  no wheezes.  no rales.  no tenderness.  Abdominal: Soft.  no distension.  no tenderness.  Musculoskeletal: Normal range of motion.  no  tenderness or deformity.  Neurological:  normal muscle tone. Coordination normal. No atrophy Skin: Skin is warm and dry. No rash noted. not diaphoretic.  Psychiatric:  normal mood and affect. behavior is normal. Thought content normal.     Recent Labs: 09/30/2017: B Natriuretic Peptide 1,114.0; TSH 3.810 10/01/2017: ALT 6; Magnesium 1.6 11/03/2017: BUN 10; Creatinine, Ser 0.81; Hemoglobin 12.5; Platelets 242; Potassium 4.1; Sodium 137    Lipid Panel Lab Results  Component Value Date   CHOL 139 10/01/2017   HDL 37 (L) 10/01/2017   LDLCALC 81 10/01/2017   TRIG 103 10/01/2017      Wt Readings from Last 3 Encounters:  11/24/17 259 lb (117.5 kg)  10/27/17 259 lb 4 oz (117.6 kg)  10/24/17 258 lb 4 oz (117.1 kg)       ASSESSMENT AND PLAN:  Atrial fibrillation with RVR (HCC) - Plan: EKG 12-Lead metoprolol 50 twice daily, diltiazem 120 daily Stay on Xarelto Maintaining normal sinus rhythm after cardioversion No changes to his medications Discussed how we can monitor heart rate at home Call us for  shortness of breath  Alcohol abuse/smoking Recommended smoking and alcohol cessation Declines Chantix,  More than 5 minutes spent discussing smoking cessation  Morbid obesity (HCC) We have encouraged continued exercise, careful diet management in an effort to lose weight.  Reports weight is stable Recommended extra Lasix for any weight gain  Essential hypertension Blood pressure stable today, no orthostasis symptoms  Mixed hyperlipidemia Recommend he stay on Lipitor 40 Goal LDL less than 70 Stable  Acute on chronic diastolic CHF (congestive heart failure) (HCC) Continue Lasix with low-dose potassium Recommended he monitor his weight, watch his salt intake He has CHF clinic next month Recommend extra Lasix for 3 pound weight gain  Disposition:   F/U 6 months   Total encounter time more than 25 minutes  Greater than 50% was spent in counseling and coordination of care with the patient    Orders Placed This Encounter  Procedures  .  EKG 12-Lead     Signed, Dossie Arbour, M.D., Ph.D. 11/24/2017  [facility] [place], [place] [phone]

## 2017-11-24 ENCOUNTER — Ambulatory Visit: Payer: Medicare (Managed Care) | Admitting: Cardiovascular Disease

## 2017-11-24 ENCOUNTER — Encounter: Payer: Self-pay | Admitting: Cardiovascular Disease

## 2017-11-24 VITALS — BP 120/64 | HR 81 | Ht 70.0 in | Wt 259.0 lb

## 2017-11-24 DIAGNOSIS — I10 Essential (primary) hypertension: Secondary | ICD-10-CM | POA: Diagnosis not present

## 2017-11-24 DIAGNOSIS — E782 Mixed hyperlipidemia: Secondary | ICD-10-CM | POA: Diagnosis not present

## 2017-11-24 DIAGNOSIS — F1010 Alcohol abuse, uncomplicated: Secondary | ICD-10-CM | POA: Diagnosis not present

## 2017-11-24 DIAGNOSIS — I481 Persistent atrial fibrillation: Secondary | ICD-10-CM | POA: Diagnosis not present

## 2017-11-24 DIAGNOSIS — I5032 Chronic diastolic (congestive) heart failure: Secondary | ICD-10-CM

## 2017-11-24 DIAGNOSIS — I4819 Other persistent atrial fibrillation: Secondary | ICD-10-CM

## 2017-11-24 MED ORDER — DILTIAZEM HCL ER COATED BEADS 120 MG PO CP24: 120.0000 mg | ORAL_CAPSULE | Freq: Every day | ORAL | 3 refills | Status: DC

## 2017-11-24 MED ORDER — FUROSEMIDE 20 MG PO TABS: 20.0000 mg | ORAL_TABLET | Freq: Every day | ORAL | 3 refills | Status: DC

## 2017-11-24 MED ORDER — ATORVASTATIN CALCIUM 40 MG PO TABS: 40.0000 mg | ORAL_TABLET | Freq: Every day | ORAL | 3 refills | Status: DC

## 2017-11-24 MED ORDER — METOPROLOL TARTRATE 50 MG PO TABS: 50.0000 mg | ORAL_TABLET | Freq: Two times a day (BID) | ORAL | 3 refills | Status: DC

## 2017-11-24 NOTE — Patient Instructions (Signed)

## 2017-12-16 ENCOUNTER — Telehealth: Payer: Self-pay | Admitting: Internal Medicine

## 2017-12-16 NOTE — Telephone Encounter (Signed)
Spoke with Michael Rowe and advised that order has been faxed to [facility] with new insurance and that she should hear from them to schedule study once study has been approved by insurance. Wife voiced understanding.  Form has been faxed and received by [facility]. [name]

## 2017-12-16 NOTE — Telephone Encounter (Signed)
Patient has new insurance and wold like to start scheduling Sleep testing   [employer]   ID [id number]      Plan [phone]  Member provider Line  [phone]   Call Wife, norma [phone]

## 2017-12-23 NOTE — Progress Notes (Deleted)
Patient ID: Michael Rowe, male    DOB: October 15, 1940, 77 y.o.   MRN: [id number]  HPI  Michael Rowe is a 77 y/o male with a history of hyperlipidemia, HTN, atrial fibrillation, anemia, alcohol use and chronic heart failure.   Echo report from 10/01/17 reviewed and showed an EF of 60-65%.  Was in the ED 11/01/17 due to left foot pain where Michael Rowe was treated and released. Admitted 09/30/17 due to atrial fibrillation and heart failure exacerbation. Cardiology consult obtained. Initially needed IV diuretics and then transitioned to oral diuretics. Medications were adjusted for his atrial fibrillation and Michael Rowe was discharged after 2 days.   Michael Rowe presents today for a follow-up visit with a chief complaint of    Past Medical History:  Diagnosis Date  . Alcohol abuse   . Atrial fibrillation (HCC)    a. patient reports being diagnosed with A fib ~ 15-20 years prior in [place], [place]; b. noted to be in Afib 1/19; c. CHADS2VASc => 4 (HTN, age x 2, vascular dosease)  . CHF (congestive heart failure) (HCC)   . HLD (hyperlipidemia)   . Hypertension   . Macrocytic anemia   . Morbid obesity (HCC)    Past Surgical History:  Procedure Laterality Date  . CARDIOVERSION N/A 11/14/2017   Procedure: CARDIOVERSION;  Surgeon: Antonieta Iba, MD;  Location: [facility] ORS;  Service: Cardiovascular;  Laterality: N/A;  . KNEE SURGERY     Family History  Problem Relation Age of Onset  . Liver disease Mother   . Heart disease Father   . CAD Brother   . Cancer Brother   . Cancer Brother   . Cancer Brother    Social History   Tobacco Use  . Smoking status: Never Smoker  . Smokeless tobacco: Never Used  Substance Use Topics  . Alcohol use: Yes    Alcohol/week: 2.4 - 3.0 oz    Types: 3 - 4 Cans of beer, 1 Shots of liquor per week    Frequency: Never    Comment: 3-5 beer daily 10/24/17 drinks 3-4 beers a week and a shot a week   No Known Allergies   Review of Systems  Constitutional: Positive for fatigue (getting  better). Negative for appetite change.  HENT: Positive for congestion. Negative for rhinorrhea and sore throat.   Eyes: Negative.   Respiratory: Negative for cough, chest tightness and shortness of breath.   Cardiovascular: Positive for leg swelling (left ankle). Negative for chest pain and palpitations.  Gastrointestinal: Negative for abdominal distention and abdominal pain.  Endocrine: Negative.   Genitourinary: Negative.   Musculoskeletal: Negative for back pain and neck pain.  Skin: Negative.   Allergic/Immunologic: Negative.   Neurological: Negative for dizziness and light-headedness.  Hematological: Negative for adenopathy. Bruises/bleeds easily.  Psychiatric/Behavioral: Negative for dysphoric mood and sleep disturbance. The patient is not nervous/anxious.       Physical Exam  Constitutional: Michael Rowe is oriented to person, place, and time. Michael Rowe appears well-developed and well-nourished.  HENT:  Head: Normocephalic and atraumatic.  Neck: Normal range of motion. Neck supple. No JVD present.  Cardiovascular: Normal rate. An irregularly irregular rhythm present.  Pulmonary/Chest: Effort normal. Michael Rowe has no wheezes. Michael Rowe has no rales.  Abdominal: Soft. Michael Rowe exhibits no distension. There is no tenderness.  Musculoskeletal: Michael Rowe exhibits edema (2+ pitting edema in bilateral lower legs). Michael Rowe exhibits no tenderness.  Neurological: Michael Rowe is alert and oriented to person, place, and time.  Skin: Skin is warm and dry.  Psychiatric:  Michael Rowe has a normal mood and affect. His behavior is normal. Thought content normal.  Nursing note and vitals reviewed.   Assessment & Plan:  1: Chronic heart failure with preserved ejection fraction- - NYHA class II - mildly fluid overloaded today with edema in lower extremities - already weighing daily and Michael Rowe was reminded to call for an overnight weight gain of >2 pounds or a weekly weight gain of >5 pounds - not adding salt to his food and his wife has been reading food  labels.  - drinking ~ 1 gallon of fluid daily which includes a lot of kool-aid. Discussed decreasing his fluid intake to closer to 60 ounces of fluid daily  (~ 1/2 gallon) and to check kool-aid envelope to see if it has sodium in it - encouraged him to elevate his legs when sitting for long periods of time and to get compression socks and start wearing them daily with removal of them at bedtime - BNP on 09/30/17 was 1114.0  - PharmD reconciled medications with the patient  2: HTN- - BP looks good today - information given regarding PCP's in the community and Michael Rowe was encouraged to get established with one of them - BMP from 11/03/17 reviewed and showed sodium 137, potassium 4.1 and GFR >60  3: Atrial fibrillation- - saw cardiology Mariah Milling(Gollan) 11/24/17 - continues on xarelto - cardioverted 11/14/17  4: Snoring- - saw pulmonologist Nicholos Johns(Ramachandran) 10/16/17 - awaiting for the sleep lab to call to get an appointment scheduled  Patient did not bring his medications nor a list. Each medication was verbally reviewed with the patient and Michael Rowe was encouraged to bring the bottles to every visit to confirm accuracy of list.

## 2017-12-25 ENCOUNTER — Ambulatory Visit: Payer: Medicare (Managed Care) | Admitting: Family

## 2018-01-29 ENCOUNTER — Ambulatory Visit (INDEPENDENT_AMBULATORY_CARE_PROVIDER_SITE_OTHER): Payer: Medicare HMO | Admitting: Family Medicine

## 2018-01-29 ENCOUNTER — Encounter: Payer: Self-pay | Admitting: Family Medicine

## 2018-01-29 VITALS — BP 112/56 | HR 66 | Ht 71.0 in | Wt 265.0 lb

## 2018-01-29 DIAGNOSIS — R296 Repeated falls: Secondary | ICD-10-CM

## 2018-01-29 DIAGNOSIS — I5032 Chronic diastolic (congestive) heart failure: Secondary | ICD-10-CM | POA: Diagnosis not present

## 2018-01-29 DIAGNOSIS — I4891 Unspecified atrial fibrillation: Secondary | ICD-10-CM | POA: Diagnosis not present

## 2018-01-29 DIAGNOSIS — M545 Low back pain, unspecified: Secondary | ICD-10-CM

## 2018-01-29 DIAGNOSIS — I10 Essential (primary) hypertension: Secondary | ICD-10-CM | POA: Diagnosis not present

## 2018-01-29 DIAGNOSIS — G8929 Other chronic pain: Secondary | ICD-10-CM | POA: Diagnosis not present

## 2018-01-29 DIAGNOSIS — E782 Mixed hyperlipidemia: Secondary | ICD-10-CM | POA: Diagnosis not present

## 2018-01-29 LAB — UA/M W/RFLX CULTURE, ROUTINE
Bilirubin, UA: NEGATIVE
Glucose, UA: NEGATIVE
Ketones, UA: NEGATIVE
LEUKOCYTES UA: NEGATIVE
Nitrite, UA: NEGATIVE
PH UA: 7 (ref 5.0–7.5)
PROTEIN UA: NEGATIVE
RBC, UA: NEGATIVE
SPEC GRAV UA: 1.015 (ref 1.005–1.030)
Urobilinogen, Ur: 1 mg/dL (ref 0.2–1.0)

## 2018-01-29 LAB — MICROALBUMIN, URINE WAIVED
Creatinine, Urine Waived: 50 mg/dL (ref 10–300)
MICROALB, UR WAIVED: 10 mg/L (ref 0–19)
Microalb/Creat Ratio: <30 mg/g (ref <30)

## 2018-01-29 MED ORDER — PAROXETINE HCL 20 MG PO TABS: 20.0000 mg | ORAL_TABLET | Freq: Every day | ORAL | 3 refills | Status: DC

## 2018-01-29 NOTE — Progress Notes (Signed)
BP (!) 112/56 (BP Location: Right Arm, Cuff Size: Normal)   Pulse 66   Ht  (1.803 m)   Wt 265 lb (120.2 kg)   SpO2 96%   BMI 36.96 kg/m    Subjective:    Patient ID: Michael Rowe, male    DOB: 18-Sep-1940, 77 y.o.   MRN: [id number]  HPI: Michael Rowe is a 77 y.o. male who presents today alone to establish care. He is not a good historian.  Chief Complaint  Patient presents with  . Establish Care    [place]  . Hypertension   Has been having issues with his balance- has fallen 4-5x in the last year. No issues feeling feet. No dizziness. Unsure if he wants to do PT or not- states that he would need to check with his wife.  Has been having a lot of back pain when he is walking. Has been going on for a couple of months. He notes that it is aching and sore in nature. In his low back. Nothing makes it better, worse with washing dishes. No radiation.   Swelling in L leg- has had [place]- No DVT  Had heart failure at the beginning of the year- has been following with Dr. Mariah Milling. Doing well   HYPERTENSION / HYPERLIPIDEMIA Satisfied with current treatment? yes Duration of hypertension: chronic BP monitoring frequency: not checking BP medication side effects: no Duration of hyperlipidemia: chronic Cholesterol medication side effects: no Cholesterol supplements: none Past cholesterol medications: atorvastatin Medication compliance: excellent compliance Aspirin: no Recent stressors: no Recurrent headaches: no Visual changes: no Palpitations: no Dyspnea: no Chest pain: no Lower extremity edema: no Dizzy/lightheaded: no  ATRIAL FIBRILLATION Atrial fibrillation status: controlled Satisfied with current treatment: yes  Medication side effects:  no Medication compliance: excellent compliance Palpitations:  no Chest pain:  no Dyspnea on exertion:  no Orthopnea:  no Syncope:  no Edema:  no Ventricular rate control: diltiazem Anti-coagulation: long  acting  Active Ambulatory Problems    Diagnosis Date Noted  . HTN (hypertension) 09/30/2017  . HLD (hyperlipidemia) 09/30/2017  . Atrial fibrillation with RVR (HCC) 09/30/2017  . Morbid obesity (HCC) 10/01/2017  . Alcohol abuse 10/01/2017  . Chronic diastolic heart failure (HCC) 10/24/2017  . Snoring 10/24/2017   Resolved Ambulatory Problems    Diagnosis Date Noted  . Acute on chronic systolic CHF (congestive heart failure) (HCC) 09/30/2017  . AKI (acute kidney injury) (HCC) 09/30/2017  . CHF (congestive heart failure) (HCC) 10/01/2017  . Acute on chronic diastolic CHF (congestive heart failure) (HCC) 10/09/2017   Past Medical History:  Diagnosis Date  . AKI (acute kidney injury) (HCC) 09/30/2017  . Alcohol abuse   . Atrial fibrillation (HCC)   . CHF (congestive heart failure) (HCC)   . HLD (hyperlipidemia)   . Hypertension   . Macrocytic anemia   . Morbid obesity (HCC)    Past Surgical History:  Procedure Laterality Date  . CARDIOVERSION N/A 11/14/2017   Procedure: CARDIOVERSION;  Surgeon: Antonieta Iba, MD;  Location: [facility] ORS;  Service: Cardiovascular;  Laterality: N/A;  . KNEE SURGERY     Outpatient Encounter Medications as of 01/29/2018  Medication Sig  . atorvastatin (LIPITOR) 40 MG tablet Take 1 tablet (40 mg total) by mouth daily.  Marland Kitchen diltiazem (CARDIZEM CD) 120 MG 24 hr capsule Take 1 capsule (120 mg total) by mouth daily.  . furosemide (LASIX) 20 MG tablet Take 1 tablet (20 mg total) by mouth daily.  Marland Kitchen  metoprolol tartrate (LOPRESSOR) 50 MG tablet Take 1 tablet (50 mg total) by mouth 2 (two) times daily.  . Multiple Vitamin (MULTIVITAMIN WITH MINERALS) TABS tablet Take 1 tablet by mouth daily. CENTRUM SILVER  . PARoxetine (PAXIL) 20 MG tablet Take 1 tablet (20 mg total) by mouth daily.  . rivaroxaban (XARELTO) 20 MG TABS tablet Take 1 tablet (20 mg total) by mouth daily with supper. (Patient taking differently: Take 20 mg by mouth daily. )  . [DISCONTINUED]  PARoxetine (PAXIL) 20 MG tablet Take 20 mg by mouth daily.   No facility-administered encounter medications on file as of 01/29/2018.    No Known Allergies  Social History   Socioeconomic History  . Marital status: Married    Spouse name: Not on file  . Number of children: 3  . Years of education: college  . Highest education level: Bachelor's degree (e.g., BA, AB, BS)  Occupational History  . Not on file  Social Needs  . Financial resource strain: Somewhat hard  . Food insecurity:    Worry: Never true    Inability: Never true  . Transportation needs:    Medical: No    Non-medical: No  Tobacco Use  . Smoking status: Never Smoker  . Smokeless tobacco: Never Used  Substance and Sexual Activity  . Alcohol use: Yes    Alcohol/week: 2.4 - 3.0 oz    Types: 3 - 4 Cans of beer, 1 Shots of liquor per week    Frequency: Never    Comment: 3-5 beer daily 10/24/17 drinks 3-4 beers a week and a shot a week  . Drug use: No  . Sexual activity: Never  Lifestyle  . Physical activity:    Days per week: 0 days    Minutes per session: 0 min  . Stress: Not at all  Relationships  . Social connections:    Talks on phone: Three times a week    Gets together: More than three times a week    Attends religious service: Never    Active member of club or organization: No    Attends meetings of clubs or organizations: Never    Relationship status: Married  Other Topics Concern  . Not on file  Social History Narrative  . Not on file   Family History  Problem Relation Age of Onset  . Liver disease Mother   . Heart disease Father   . CAD Brother   . Cancer Brother   . Cancer Brother   . Cancer Brother     Review of Systems  Constitutional: Negative.   Respiratory: Negative.   Cardiovascular: Negative.   Gastrointestinal: Negative.   Musculoskeletal: Positive for back pain and myalgias. Negative for arthralgias, gait problem, joint swelling, neck pain and neck stiffness.  Skin:  Negative.   Neurological: Negative.   Psychiatric/Behavioral: Negative.     Per HPI unless specifically indicated above     Objective:    BP (!) 112/56 (BP Location: Right Arm, Cuff Size: Normal)   Pulse 66   Ht  (1.803 m)   Wt 265 lb (120.2 kg)   SpO2 96%   BMI 36.96 kg/m   Wt Readings from Last 3 Encounters:  01/29/18 265 lb (120.2 kg)  11/24/17 259 lb (117.5 kg)  10/27/17 259 lb 4 oz (117.6 kg)    Physical Exam  Constitutional: He is oriented to person, place, and time. He appears well-developed and well-nourished. No distress.  HENT:  Head: Normocephalic and atraumatic.  Right Ear: Hearing normal.  Left Ear: Hearing normal.  Nose: Nose normal.  Eyes: Pupils are equal, round, and reactive to light. Conjunctivae, EOM and lids are normal. Right eye exhibits no discharge. Left eye exhibits no discharge. No scleral icterus.  Cardiovascular: Normal rate, regular rhythm, normal heart sounds and intact distal pulses. Exam reveals no gallop and no friction rub.  No murmur heard. Pulmonary/Chest: Effort normal and breath sounds normal. No stridor. No respiratory distress. He has no wheezes. He has no rales. He exhibits no tenderness.  Musculoskeletal: Normal range of motion.  Neurological: He is alert and oriented to person, place, and time.  Skin: Skin is warm, dry and intact. Capillary refill takes less than 2 seconds. No rash noted. He is not diaphoretic. No erythema. No pallor.  Psychiatric: He has a normal mood and affect. His speech is normal and behavior is normal. Judgment and thought content normal. Cognition and memory are normal.  Nursing note and vitals reviewed.   Results for orders placed or performed in visit on 01/29/18  CBC with Differential/Platelet  Result Value Ref Range   WBC 6.5 3.4 - 10.8 x10E3/uL   RBC 4.09 (L) 4.14 - 5.80 x10E6/uL   Hemoglobin 13.5 13.0 - 17.7 g/dL   Hematocrit 45.4 09.8 - 51.0 %   MCV 100 (H) 79 - 97 fL   MCH 33.0 26.6 - 33.0  pg   MCHC 32.9 31.5 - 35.7 g/dL   RDW 11.9 14.7 - 82.9 %   Platelets 248 150 - 450 x10E3/uL   Neutrophils 64 Not Estab. %   Lymphs 20 Not Estab. %   Monocytes 13 Not Estab. %   Eos 3 Not Estab. %   Basos 0 Not Estab. %   Neutrophils Absolute 4.2 1.4 - 7.0 x10E3/uL   Lymphocytes Absolute 1.3 0.7 - 3.1 x10E3/uL   Monocytes Absolute 0.8 0.1 - 0.9 x10E3/uL   EOS (ABSOLUTE) 0.2 0.0 - 0.4 x10E3/uL   Basophils Absolute 0.0 0.0 - 0.2 x10E3/uL   Immature Granulocytes 0 Not Estab. %   Immature Grans (Abs) 0.0 0.0 - 0.1 x10E3/uL  Comprehensive metabolic panel  Result Value Ref Range   Glucose 108 (H) 65 - 99 mg/dL   BUN 13 8 - 27 mg/dL   Creatinine, Ser 5.62 0.76 - 1.27 mg/dL   GFR calc non Af Amer 82 >59 mL/min/1.73   GFR calc Af Amer 95 >59 mL/min/1.73   BUN/Creatinine Ratio 14 10 - 24   Sodium 140 134 - 144 mmol/L   Potassium 4.6 3.5 - 5.2 mmol/L   Chloride 97 96 - 106 mmol/L   CO2 27 20 - 29 mmol/L   Calcium 9.1 8.6 - 10.2 mg/dL   Total Protein 6.4 6.0 - 8.5 g/dL   Albumin 4.0 3.5 - 4.8 g/dL   Globulin, Total 2.4 1.5 - 4.5 g/dL   Albumin/Globulin Ratio 1.7 1.2 - 2.2   Bilirubin Total 0.8 0.0 - 1.2 mg/dL   Alkaline Phosphatase 66 39 - 117 IU/L   AST 25 0 - 40 IU/L   ALT 9 0 - 44 IU/L  Lipid Panel w/o Chol/HDL Ratio  Result Value Ref Range   Cholesterol, Total 176 100 - 199 mg/dL   Triglycerides 130 0 - 149 mg/dL   HDL 43 >86 mg/dL   VLDL Cholesterol Cal 27 5 - 40 mg/dL   LDL Calculated 578 (H) 0 - 99 mg/dL  Microalbumin, Urine Waived  Result Value Ref Range   Microalb, Ur Waived  10 0 - 19 mg/L   Creatinine, Urine Waived 50 10 - 300 mg/dL   Microalb/Creat Ratio <30 <30 mg/g  UA/M w/rflx Culture, Routine  Result Value Ref Range   Specific Gravity, UA 1.015 1.005 - 1.030   pH, UA 7.0 5.0 - 7.5   Color, UA Yellow Yellow   Appearance Ur Clear Clear   Leukocytes, UA Negative Negative   Protein, UA Negative Negative/Trace   Glucose, UA Negative Negative   Ketones, UA  Negative Negative   RBC, UA Negative Negative   Bilirubin, UA Negative Negative   Urobilinogen, Ur 1.0 0.2 - 1.0 mg/dL   Nitrite, UA Negative Negative      Assessment & Plan:   Problem List Items Addressed This Visit      Cardiovascular and Mediastinum   Chronic diastolic heart failure (HCC) - Primary (Chronic)    Continue to follow with cardiology. Euvolemic today. Call with any concerns.       Relevant Orders   CBC with Differential/Platelet (Completed)   Comprehensive metabolic panel (Completed)   UA/M w/rflx Culture, Routine (Completed)   HTN (hypertension)    Slightly elevated today. Continue diet and exercise. Continue current regimen. Call with any concerns.       Relevant Orders   CBC with Differential/Platelet (Completed)   Comprehensive metabolic panel (Completed)   Microalbumin, Urine Waived (Completed)   UA/M w/rflx Culture, Routine (Completed)   Atrial fibrillation with RVR (HCC)    HR under good control. On Xarelto. Continue to monitor.       Relevant Orders   CBC with Differential/Platelet (Completed)   Comprehensive metabolic panel (Completed)   UA/M w/rflx Culture, Routine (Completed)     Other   HLD (hyperlipidemia)    Rechecking levels today. Await results. Continue to monitor.       Relevant Orders   CBC with Differential/Platelet (Completed)   Comprehensive metabolic panel (Completed)   Lipid Panel w/o Chol/HDL Ratio (Completed)   UA/M w/rflx Culture, Routine (Completed)   Morbid obesity (HCC)    Encouraged slow weight loss of 1-2lbs a week. Increase activity. Call with any concerns.        Other Visit Diagnoses    Chronic bilateral low back pain without sciatica       Will obtain x-ray. Await results. Call with any concerns. Will consider PT.   Relevant Orders   DG Lumbar Spine Complete   Frequent falls       Offered PT. Will consider it. Continue to monitor.        Follow up plan: Return in about 1 month (around 02/26/2018) for  Records release please.

## 2018-01-30 ENCOUNTER — Encounter: Payer: Self-pay | Admitting: Family Medicine

## 2018-01-30 LAB — CBC WITH DIFFERENTIAL/PLATELET
BASOS: 0 %
Basophils Absolute: 0 10*3/uL (ref 0.0–0.2)
EOS (ABSOLUTE): 0.2 10*3/uL (ref 0.0–0.4)
Eos: 3 %
HEMATOCRIT: 41 % (ref 37.5–51.0)
HEMOGLOBIN: 13.5 g/dL (ref 13.0–17.7)
IMMATURE GRANS (ABS): 0 10*3/uL (ref 0.0–0.1)
Immature Granulocytes: 0 %
LYMPHS: 20 %
Lymphocytes Absolute: 1.3 10*3/uL (ref 0.7–3.1)
MCH: 33 pg (ref 26.6–33.0)
MCHC: 32.9 g/dL (ref 31.5–35.7)
MCV: 100 fL — AB (ref 79–97)
MONOCYTES: 13 %
Monocytes Absolute: 0.8 10*3/uL (ref 0.1–0.9)
NEUTROS ABS: 4.2 10*3/uL (ref 1.4–7.0)
Neutrophils: 64 %
Platelets: 248 10*3/uL (ref 150–450)
RBC: 4.09 x10E6/uL — ABNORMAL LOW (ref 4.14–5.80)
RDW: 14.9 % (ref 12.3–15.4)
WBC: 6.5 10*3/uL (ref 3.4–10.8)

## 2018-01-30 LAB — COMPREHENSIVE METABOLIC PANEL
A/G RATIO: 1.7 (ref 1.2–2.2)
ALT: 9 IU/L (ref 0–44)
AST: 25 IU/L (ref 0–40)
Albumin: 4 g/dL (ref 3.5–4.8)
Alkaline Phosphatase: 66 IU/L (ref 39–117)
BILIRUBIN TOTAL: 0.8 mg/dL (ref 0.0–1.2)
BUN/Creatinine Ratio: 14 (ref 10–24)
BUN: 13 mg/dL (ref 8–27)
CHLORIDE: 97 mmol/L (ref 96–106)
CO2: 27 mmol/L (ref 20–29)
Calcium: 9.1 mg/dL (ref 8.6–10.2)
Creatinine, Ser: 0.9 mg/dL (ref 0.76–1.27)
GFR calc non Af Amer: 82 mL/min/{1.73_m2} (ref >59)
GFR, EST AFRICAN AMERICAN: 95 mL/min/{1.73_m2} (ref >59)
Globulin, Total: 2.4 g/dL (ref 1.5–4.5)
Glucose: 108 mg/dL — ABNORMAL HIGH (ref 65–99)
Potassium: 4.6 mmol/L (ref 3.5–5.2)
Sodium: 140 mmol/L (ref 134–144)
TOTAL PROTEIN: 6.4 g/dL (ref 6.0–8.5)

## 2018-01-30 LAB — LIPID PANEL W/O CHOL/HDL RATIO
Cholesterol, Total: 176 mg/dL (ref 100–199)
HDL: 43 mg/dL (ref >39)
LDL Calculated: 106 mg/dL — ABNORMAL HIGH (ref 0–99)
Triglycerides: 135 mg/dL (ref 0–149)
VLDL CHOLESTEROL CAL: 27 mg/dL (ref 5–40)

## 2018-02-01 ENCOUNTER — Encounter: Payer: Self-pay | Admitting: Family Medicine

## 2018-02-01 NOTE — Assessment & Plan Note (Signed)
Rechecking levels today. Await results. Continue to monitor.  

## 2018-02-01 NOTE — Assessment & Plan Note (Signed)
HR under good control. On Xarelto. Continue to monitor.

## 2018-02-01 NOTE — Assessment & Plan Note (Signed)
Encouraged slow weight loss of 1-2lbs a week. Increase activity. Call with any concerns.

## 2018-02-01 NOTE — Assessment & Plan Note (Signed)
Slightly elevated today. Continue diet and exercise. Continue current regimen. Call with any concerns.

## 2018-02-01 NOTE — Assessment & Plan Note (Addendum)
Continue to follow with cardiology. Euvolemic today. Call with any concerns.

## 2018-02-09 ENCOUNTER — Ambulatory Visit: Payer: Self-pay

## 2018-02-09 NOTE — Telephone Encounter (Signed)
Patient called about his c/o dizziness, he says someone already called and he has an appointment tomorrow at 1530.

## 2018-02-10 ENCOUNTER — Ambulatory Visit
Admission: RE | Admit: 2018-02-10 | Discharge: 2018-02-10 | Disposition: A | Discharge status: Home / Self Care | Payer: Medicare HMO | Source: Ambulatory Visit | Attending: Family Medicine | Admitting: Family Medicine

## 2018-02-10 ENCOUNTER — Ambulatory Visit: Admission: RE | Admit: 2018-02-10 | Payer: Medicare HMO | Source: Ambulatory Visit | Admitting: *Deleted

## 2018-02-10 ENCOUNTER — Ambulatory Visit (INDEPENDENT_AMBULATORY_CARE_PROVIDER_SITE_OTHER): Payer: Medicare HMO | Admitting: Family Medicine

## 2018-02-10 ENCOUNTER — Encounter: Payer: Self-pay | Admitting: Family Medicine

## 2018-02-10 VITALS — BP 129/73 | HR 73 | Temp 98.3°F | Wt 267.2 lb

## 2018-02-10 DIAGNOSIS — M545 Low back pain, unspecified: Secondary | ICD-10-CM

## 2018-02-10 DIAGNOSIS — R296 Repeated falls: Secondary | ICD-10-CM | POA: Diagnosis not present

## 2018-02-10 DIAGNOSIS — G8929 Other chronic pain: Secondary | ICD-10-CM | POA: Diagnosis not present

## 2018-02-10 DIAGNOSIS — R42 Dizziness and giddiness: Secondary | ICD-10-CM | POA: Diagnosis not present

## 2018-02-10 NOTE — Patient Instructions (Addendum)
Your blood pressure is dropping when you get up.  Increase your fluids.  Squeeze your bottom before you get up. Take it slow!   Orthostatic Hypotension Orthostatic hypotension is a sudden drop in blood pressure that happens when you quickly change positions, such as when you get up from a seated or lying position. Blood pressure is a measurement of how strongly, or weakly, your blood is pressing against the walls of your arteries. Arteries are blood vessels that carry blood from your heart throughout your body. When blood pressure is too low, you may not get enough blood to your brain or to the rest of your organs. This can cause weakness, light-headedness, rapid heartbeat, and fainting. This can last for just a few seconds or for up to a few minutes. Orthostatic hypotension is usually not a serious problem. However, if it happens frequently or gets worse, it may be a sign of something more serious. What are the causes? This condition may be caused by:  Sudden changes in posture, such as standing up quickly after you have been sitting or lying down.  Blood loss.  Loss of body fluids (dehydration).  Heart problems.  Hormone (endocrine) problems.  Pregnancy.  Severe infection.  Lack of certain nutrients.  Severe allergic reactions (anaphylaxis).  Certain medicines, such as blood pressure medicine or medicines that make the body lose excess fluids (diuretics). Sometimes, this condition can be caused by not taking medicine as directed, such as taking too much of a certain medicine.  What increases the risk? Certain factors can make you more likely to develop orthostatic hypotension, including:  Age. Risk increases as you get older.  Conditions that affect the heart or the central nervous system.  Taking certain medicines, such as blood pressure medicine or diuretics.  Being pregnant.  What are the signs or symptoms? Symptoms of this condition may  include:  Weakness.  Light-headedness.  Dizziness.  Blurred vision.  Fatigue.  Rapid heartbeat.  Fainting, in severe cases.  How is this diagnosed? This condition is diagnosed based on:  Your medical history.  Your symptoms.  Your blood pressure measurement. Your health care provider will check your blood pressure when you are: ? Lying down. ? Sitting. ? Standing.  A blood pressure reading is recorded as two numbers, such as "120 over 80" (or 120/80). The first ("top") number is called the systolic pressure. It is a measure of the pressure in your arteries as your heart beats. The second ("bottom") number is called the diastolic pressure. It is a measure of the pressure in your arteries when your heart relaxes between beats. Blood pressure is measured in a unit called mm Hg. Healthy blood pressure for adults is 120/80. If your blood pressure is below 90/60, you may be diagnosed with hypotension. Other information or tests that may be used to diagnose orthostatic hypotension include:  Your other vital signs, such as your heart rate and temperature.  Blood tests.  Tilt table test. For this test, you will be safely secured to a table that moves you from a lying position to an upright position. Your heart rhythm and blood pressure will be monitored during the test.  How is this treated? Treatment for this condition may include:  Changing your diet. This may involve eating more salt (sodium) or drinking more water.  Taking medicines to raise your blood pressure.  Changing the dosage of certain medicines you are taking that might be lowering your blood pressure.  Wearing compression stockings. These  stockings help to prevent blood clots and reduce swelling in your legs.  In some cases, you may need to go to the hospital for:  Fluid replacement. This means you will receive fluids through an IV tube.  Blood replacement. This means you will receive donated blood through an  IV tube (transfusion).  Treating an infection or heart problems, if this applies.  Monitoring. You may need to be monitored while medicines that you are taking wear off.  Follow these instructions at home: Eating and drinking   Drink enough fluid to keep your urine clear or pale yellow.  Eat a healthy diet and follow instructions from your health care provider about eating or drinking restrictions. A healthy diet includes: ? Fresh fruits and vegetables. ? Whole grains. ? Lean meats. ? Low-fat dairy products.  Eat extra salt only as directed. Do not add extra salt to your diet unless your health care provider told you to do that.  Eat frequent, small meals.  Avoid standing up suddenly after eating. Medicines  Take over-the-counter and prescription medicines only as told by your health care provider. ? Follow instructions from your health care provider about changing the dosage of your current medicines, if this applies. ? Do not stop or adjust any of your medicines on your own. General instructions  Wear compression stockings as told by your health care provider.  Get up slowly from lying down or sitting positions. This gives your blood pressure a chance to adjust.  Avoid hot showers and excessive heat as directed by your health care provider.  Return to your normal activities as told by your health care provider. Ask your health care provider what activities are safe for you.  Do not use any products that contain nicotine or tobacco, such as cigarettes and e-cigarettes. If you need help quitting, ask your health care provider.  Keep all follow-up visits as told by your health care provider. This is important. Contact a health care provider if:  You vomit.  You have diarrhea.  You have a fever for more than 2-3 days.  You feel more thirsty than usual.  You feel weak and tired. Get help right away if:  You have chest pain.  You have a fast or irregular  heartbeat.  You develop numbness in any part of your body.  You cannot move your arms or your legs.  You have trouble speaking.  You become sweaty or feel lightheaded.  You faint.  You feel short of breath.  You have trouble staying awake.  You feel confused. This information is not intended to replace advice given to you by your health care provider. Make sure you discuss any questions you have with your health care provider. Document Released: 08/09/2002 Document Revised: 05/07/2016 Document Reviewed: 02/09/2016 [employer] Interactive Patient Education  2018 [employer]

## 2018-02-10 NOTE — Progress Notes (Signed)
BP 129/73 (BP Location: Left Arm, Patient Position: Sitting, Cuff Size: Large)   Pulse 73   Temp 98.3 F (36.8 C)   Wt 267 lb 3 oz (121.2 kg)   SpO2 94%   BMI 37.27 kg/m    Subjective:    Patient ID: Michael Rowe, male    DOB: [birth year], 77 y.o.   MRN: [id number]  HPI: Michael Rowe is a 77 y.o. male  Chief Complaint  Patient presents with  . Dizziness   DIZZINESS Duration: weeks- occasionally Description of symptoms: lightheaded Duration of episode: minutes Dizziness frequency: recurrent Provoking factors: going from sitting to standing Aggravating factors:  going from sitting to standing Triggered by rolling over in bed: no Triggered by bending over: yes Aggravated by head movement: no Aggravated by exertion, coughing, loud noises: no Recent head injury: no Recent or current viral symptoms: no History of vasovagal episodes: no Nausea: no Vomiting: no Tinnitus: no Hearing loss: no Aural fullness: no Headache: no Photophobia/phonophobia: no Unsteady gait: yes Postural instability: yes Diplopia, dysarthria, dysphagia or weakness: no Related to exertion: no Pallor: no Diaphoresis: no Dyspnea: no Chest pain: no   Relevant past medical, surgical, family and social history reviewed and updated as indicated. Interim medical history since our last visit reviewed. Allergies and medications reviewed and updated.  Review of Systems  Constitutional: Negative.   HENT: Negative.   Respiratory: Negative.   Cardiovascular: Negative.   Neurological: Positive for dizziness and light-headedness. Negative for tremors, seizures, syncope, facial asymmetry, speech difficulty, weakness, numbness and headaches.  Psychiatric/Behavioral: Negative.     Per HPI unless specifically indicated above     Objective:    BP 129/73 (BP Location: Left Arm, Patient Position: Sitting, Cuff Size: Large)   Pulse 73   Temp 98.3 F (36.8 C)   Wt 267 lb 3 oz (121.2 kg)    SpO2 94%   BMI 37.27 kg/m   Wt Readings from Last 3 Encounters:  02/10/18 267 lb 3 oz (121.2 kg)  01/29/18 265 lb (120.2 kg)  11/24/17 259 lb (117.5 kg)    Orthostatic VS for the past 24 hrs:  BP- Lying Pulse- Lying BP- Sitting Pulse- Sitting BP- Standing at 0 minutes Pulse- Standing at 0 minutes  02/10/18 1551 147/77 78 124/78 82 128/78 85     Physical Exam  Constitutional: He is oriented to person, place, and time. He appears well-developed and well-nourished. No distress.  HENT:  Head: Normocephalic and atraumatic.  Right Ear: Hearing and external ear normal.  Left Ear: Hearing and external ear normal.  Nose: Nose normal.  Mouth/Throat: Oropharynx is clear and moist. No oropharyngeal exudate.  Eyes: Pupils are equal, round, and reactive to light. Conjunctivae, EOM and lids are normal. Right eye exhibits no discharge. Left eye exhibits no discharge. No scleral icterus.  Neck: Normal range of motion. Neck supple. No JVD present. No tracheal deviation present. No thyromegaly present.  Cardiovascular: Normal rate, regular rhythm, normal heart sounds and intact distal pulses. Exam reveals no gallop and no friction rub.  No murmur heard. Pulmonary/Chest: Effort normal and breath sounds normal. No stridor. No respiratory distress. He has no wheezes. He has no rales. He exhibits no tenderness.  Musculoskeletal: Normal range of motion.  Lymphadenopathy:    He has no cervical adenopathy.  Neurological: He is alert and oriented to person, place, and time. He displays normal reflexes. No cranial nerve deficit or sensory deficit. He exhibits normal muscle tone. Coordination normal.  Skin:  Skin is warm, dry and intact. Capillary refill takes less than 2 seconds. No rash noted. He is not diaphoretic. No erythema. No pallor.  Psychiatric: He has a normal mood and affect. His speech is normal and behavior is normal. Judgment and thought content normal. Cognition and memory are normal.    Results  for orders placed or performed in visit on 01/29/18  CBC with Differential/Platelet  Result Value Ref Range   WBC 6.5 3.4 - 10.8 x10E3/uL   RBC 4.09 (L) 4.14 - 5.80 x10E6/uL   Hemoglobin 13.5 13.0 - 17.7 g/dL   Hematocrit 16.1 09.6 - 51.0 %   MCV 100 (H) 79 - 97 fL   MCH 33.0 26.6 - 33.0 pg   MCHC 32.9 31.5 - 35.7 g/dL   RDW 04.5 40.9 - 81.1 %   Platelets 248 150 - 450 x10E3/uL   Neutrophils 64 Not Estab. %   Lymphs 20 Not Estab. %   Monocytes 13 Not Estab. %   Eos 3 Not Estab. %   Basos 0 Not Estab. %   Neutrophils Absolute 4.2 1.4 - 7.0 x10E3/uL   Lymphocytes Absolute 1.3 0.7 - 3.1 x10E3/uL   Monocytes Absolute 0.8 0.1 - 0.9 x10E3/uL   EOS (ABSOLUTE) 0.2 0.0 - 0.4 x10E3/uL   Basophils Absolute 0.0 0.0 - 0.2 x10E3/uL   Immature Granulocytes 0 Not Estab. %   Immature Grans (Abs) 0.0 0.0 - 0.1 x10E3/uL  Comprehensive metabolic panel  Result Value Ref Range   Glucose 108 (H) 65 - 99 mg/dL   BUN 13 8 - 27 mg/dL   Creatinine, Ser 9.14 0.76 - 1.27 mg/dL   GFR calc non Af Amer 82 >59 mL/min/1.73   GFR calc Af Amer 95 >59 mL/min/1.73   BUN/Creatinine Ratio 14 10 - 24   Sodium 140 134 - 144 mmol/L   Potassium 4.6 3.5 - 5.2 mmol/L   Chloride 97 96 - 106 mmol/L   CO2 27 20 - 29 mmol/L   Calcium 9.1 8.6 - 10.2 mg/dL   Total Protein 6.4 6.0 - 8.5 g/dL   Albumin 4.0 3.5 - 4.8 g/dL   Globulin, Total 2.4 1.5 - 4.5 g/dL   Albumin/Globulin Ratio 1.7 1.2 - 2.2   Bilirubin Total 0.8 0.0 - 1.2 mg/dL   Alkaline Phosphatase 66 39 - 117 IU/L   AST 25 0 - 40 IU/L   ALT 9 0 - 44 IU/L  Lipid Panel w/o Chol/HDL Ratio  Result Value Ref Range   Cholesterol, Total 176 100 - 199 mg/dL   Triglycerides 782 0 - 149 mg/dL   HDL 43 >95 mg/dL   VLDL Cholesterol Cal 27 5 - 40 mg/dL   LDL Calculated 621 (H) 0 - 99 mg/dL  Microalbumin, Urine Waived  Result Value Ref Range   Microalb, Ur Waived 10 0 - 19 mg/L   Creatinine, Urine Waived 50 10 - 300 mg/dL   Microalb/Creat Ratio <30 <30 mg/g  UA/M  w/rflx Culture, Routine  Result Value Ref Range   Specific Gravity, UA 1.015 1.005 - 1.030   pH, UA 7.0 5.0 - 7.5   Color, UA Yellow Yellow   Appearance Ur Clear Clear   Leukocytes, UA Negative Negative   Protein, UA Negative Negative/Trace   Glucose, UA Negative Negative   Ketones, UA Negative Negative   RBC, UA Negative Negative   Bilirubin, UA Negative Negative   Urobilinogen, Ur 1.0 0.2 - 1.0 mg/dL   Nitrite, UA Negative Negative  Assessment & Plan:   Problem List Items Addressed This Visit    None    Visit Diagnoses    Dizziness    -  Primary   Likely some orthostasis due to low fluid intake. Discussed today. Call with any concerns or if getting worse.    Frequent falls       Willing to do PT. Referral generated today. Call with any concerns.    Relevant Orders   Ambulatory referral to Home Health       Follow up plan: Return in about 1 month (around 03/10/2018).

## 2018-02-13 ENCOUNTER — Encounter: Payer: Self-pay | Admitting: Family Medicine

## 2018-03-09 ENCOUNTER — Ambulatory Visit: Payer: Medicare HMO | Admitting: Family Medicine

## 2018-07-07 ENCOUNTER — Other Ambulatory Visit: Payer: Self-pay

## 2018-07-07 NOTE — Telephone Encounter (Signed)
Please review for refill Pharmacy request for Xarelto 20 mg form [facility] [place]

## 2018-07-08 MED ORDER — RIVAROXABAN 20 MG PO TABS: 20.0000 mg | ORAL_TABLET | Freq: Every day | ORAL | 6 refills | Status: DC

## 2018-07-09 ENCOUNTER — Telehealth: Payer: Self-pay | Admitting: Cardiovascular Disease

## 2018-07-09 NOTE — Telephone Encounter (Signed)
Patient calling the office for samples of medication:   1.  What medication and dosage are you requesting samples for?  Xarelto 20 MG  2.  Are you currently out of this medication? Patient wife calling, will be out soon, is in donut hole with insurance and will be unable to get anymore for the rest of the year.  Would like to know if there is any coupons or assistance that can be provided.

## 2018-07-09 NOTE — Telephone Encounter (Signed)
I attempted to call the patient back.  I left a detailed message on his identified voice mail that I have pulled a couple weeks worth of Xarelto samples for him, but we cannot be guaranteed to have samples every time he needs those through the rest of the year.  I have advised I will place patient assistance paperwork with his samples at the front desk for him to complete the patient section then return to [place] for completion of the physician section and we will submit for him.  I asked that he call back with any questions.   Samples given: Xarelto 20 mg Lot: [id number] Exp: 3/21 # 2 bottles given

## 2018-08-14 ENCOUNTER — Other Ambulatory Visit: Payer: Self-pay | Admitting: Cardiovascular Disease

## 2018-08-14 NOTE — Telephone Encounter (Signed)
°*  STAT* If patient is at the pharmacy, call can be transferred to refill team.   1. Which medications need to be refilled? (please list name of each medication and dose if known) Xarelto 20 mg po q day   2. Which pharmacy/location (including street and city if local pharmacy) is medication to be sent to? [facility] [place]   3. Do they need a 30 day or 90 day supply? 30

## 2018-08-14 NOTE — Telephone Encounter (Signed)
Please review for refill. Thanks!  

## 2018-08-17 MED ORDER — RIVAROXABAN 20 MG PO TABS: 20.0000 mg | ORAL_TABLET | Freq: Every day | ORAL | 6 refills | Status: DC

## 2018-08-17 NOTE — Telephone Encounter (Signed)
Pt's wt 121.2 kg, age 77, serum creatinine 0.9, CrCl 115.96. Xarelto 20 mg once daily, #30 w/ 6 refills sent to [facility] in [place].

## 2018-11-02 ENCOUNTER — Other Ambulatory Visit: Payer: Self-pay

## 2018-11-02 NOTE — Telephone Encounter (Signed)
Fax from [facility] for medication refill. Last seen 02/10/2018.  No upcoming appointments.

## 2018-11-02 NOTE — Telephone Encounter (Signed)
Needs a follow up appointment.

## 2018-11-04 NOTE — Telephone Encounter (Signed)
Pt has an appt Friday °

## 2018-11-06 ENCOUNTER — Ambulatory Visit (INDEPENDENT_AMBULATORY_CARE_PROVIDER_SITE_OTHER): Payer: Medicare HMO | Admitting: Family Medicine

## 2018-11-06 ENCOUNTER — Other Ambulatory Visit: Payer: Self-pay

## 2018-11-06 ENCOUNTER — Encounter: Payer: Self-pay | Admitting: Family Medicine

## 2018-11-06 VITALS — BP 124/77 | HR 66 | Temp 97.7°F | Ht 71.0 in | Wt 254.0 lb

## 2018-11-06 DIAGNOSIS — E782 Mixed hyperlipidemia: Secondary | ICD-10-CM | POA: Diagnosis not present

## 2018-11-06 DIAGNOSIS — I5032 Chronic diastolic (congestive) heart failure: Secondary | ICD-10-CM | POA: Diagnosis not present

## 2018-11-06 DIAGNOSIS — I4891 Unspecified atrial fibrillation: Secondary | ICD-10-CM

## 2018-11-06 DIAGNOSIS — F324 Major depressive disorder, single episode, in partial remission: Secondary | ICD-10-CM

## 2018-11-06 DIAGNOSIS — R3911 Hesitancy of micturition: Secondary | ICD-10-CM

## 2018-11-06 DIAGNOSIS — I10 Essential (primary) hypertension: Secondary | ICD-10-CM

## 2018-11-06 LAB — MICROALBUMIN, URINE WAIVED
Creatinine, Urine Waived: 50 mg/dL (ref 10–300)
Microalb, Ur Waived: 10 mg/L (ref 0–19)
Microalb/Creat Ratio: <30 mg/g (ref <30)

## 2018-11-06 LAB — BAYER DCA HB A1C WAIVED: HB A1C (BAYER DCA - WAIVED): 5.3 % (ref <7.0)

## 2018-11-06 LAB — UA/M W/RFLX CULTURE, ROUTINE
Bilirubin, UA: NEGATIVE
Glucose, UA: NEGATIVE
Ketones, UA: NEGATIVE
LEUKOCYTES UA: NEGATIVE
NITRITE UA: NEGATIVE
PH UA: 7 (ref 5.0–7.5)
Protein, UA: NEGATIVE
RBC UA: NEGATIVE
Specific Gravity, UA: 1.01 (ref 1.005–1.030)
UUROB: 4 mg/dL — AB (ref 0.2–1.0)

## 2018-11-06 MED ORDER — METOPROLOL TARTRATE 50 MG PO TABS: 50.0000 mg | ORAL_TABLET | Freq: Two times a day (BID) | ORAL | 1 refills | Status: DC

## 2018-11-06 MED ORDER — PAROXETINE HCL 20 MG PO TABS: 20.0000 mg | ORAL_TABLET | Freq: Every day | ORAL | 1 refills | Status: DC

## 2018-11-06 MED ORDER — ATORVASTATIN CALCIUM 40 MG PO TABS: 40.0000 mg | ORAL_TABLET | Freq: Every day | ORAL | 1 refills | Status: DC

## 2018-11-06 NOTE — Assessment & Plan Note (Signed)
Under good control on current regimen. Refills given today. Follow up 6 months. Labs checked today. Call with any concerns.  

## 2018-11-06 NOTE — Assessment & Plan Note (Signed)
Patient has lost 13 lbs in the last 9 months. Continue slow, steady weight loss with goal of losing 1-2lbs a week. Morbid obesity secondary to HTN and HLD. Call with any concerns. Continue to monitor.

## 2018-11-06 NOTE — Assessment & Plan Note (Signed)
Stable. Follows with Dr. Mariah Milling. Weight has been stable. Call with any concerns.

## 2018-11-06 NOTE — Assessment & Plan Note (Signed)
Under good control on current regimen. Refills given today. Follow up 6 months. Labs checked today. Call with any concerns.

## 2018-11-06 NOTE — Progress Notes (Signed)
BP 124/77   Pulse 66   Temp 97.7 F (36.5 C) (Oral)   Ht  (1.803 m)   Wt 254 lb (115.2 kg)   SpO2 97%   BMI 35.43 kg/m    Subjective:    Patient ID: Michael Rowe, male    DOB: [birth year], 78 y.o.   MRN: [id number]  HPI: Michael Rowe is a 78 y.o. male who presents today for follow up after being lost to follow up for 9 months.   Chief Complaint  Patient presents with  . Hypertension  . Hyperlipidemia  . Congestive Heart Failure   HYPERTENSION / HYPERLIPIDEMIA Satisfied with current treatment? yes Duration of hypertension: chronic BP monitoring frequency: not checking BP medication side effects: no Past BP meds: cardizem, lasix, metoprolol Duration of hyperlipidemia: chronic Cholesterol medication side effects: no Cholesterol supplements: none Past cholesterol medications: atorvastatin Medication compliance: excellent compliance Aspirin: no Recent stressors: no Recurrent headaches: no Visual changes: no Palpitations: no Dyspnea: no Chest pain: no Lower extremity edema: no Dizzy/lightheaded: no  DEPRESSION Mood status: better Satisfied with current treatment?: yes Symptom severity: mild  Duration of current treatment : chronic Side effects: no Medication compliance: good compliance Psychotherapy/counseling: no  Previous psychiatric medications: paxil Depressed mood: no Anxious mood: no Anhedonia: no Significant weight loss or gain: no Insomnia: no  Fatigue: no Feelings of worthlessness or guilt: no Impaired concentration/indecisiveness: no Suicidal ideations: no Hopelessness: no Crying spells: no Depression screen [facility] 2/9 11/06/2018 02/03/2018 10/24/2017  Decreased Interest 0 0 0  Down, Depressed, Hopeless 0 0 0  PHQ - 2 Score 0 0 0  Altered sleeping 0 0 -  Tired, decreased energy 0 1 -  Change in appetite 0 0 -  Feeling bad or failure about yourself  0 0 -  Trouble concentrating 0 0 -  Moving slowly or fidgety/restless 0 0 -    Suicidal thoughts 0 0 -  PHQ-9 Score 0 1 -  Difficult doing work/chores Not difficult at all Not difficult at all -    Relevant past medical, surgical, family and social history reviewed and updated as indicated. Interim medical history since our last visit reviewed. Allergies and medications reviewed and updated.  Review of Systems  Constitutional: Negative.   Respiratory: Negative.   Cardiovascular: Negative.   Musculoskeletal: Negative.   Neurological: Negative.   Psychiatric/Behavioral: Negative.     Per HPI unless specifically indicated above     Objective:    BP 124/77   Pulse 66   Temp 97.7 F (36.5 C) (Oral)   Ht  (1.803 m)   Wt 254 lb (115.2 kg)   SpO2 97%   BMI 35.43 kg/m   Wt Readings from Last 3 Encounters:  11/06/18 254 lb (115.2 kg)  02/10/18 267 lb 3 oz (121.2 kg)  01/29/18 265 lb (120.2 kg)    Physical Exam Vitals signs and nursing note reviewed.  Constitutional:      General: He is not in acute distress.    Appearance: Normal appearance. He is not ill-appearing, toxic-appearing or diaphoretic.  HENT:     Head: Normocephalic and atraumatic.     Right Ear: External ear normal.     Left Ear: External ear normal.     Nose: Nose normal.     Mouth/Throat:     Mouth: Mucous membranes are moist.     Pharynx: Oropharynx is clear.  Eyes:     General: No scleral icterus.  Right eye: No discharge.        Left eye: No discharge.     Extraocular Movements: Extraocular movements intact.     Conjunctiva/sclera: Conjunctivae normal.     Pupils: Pupils are equal, round, and reactive to light.  Neck:     Musculoskeletal: Normal range of motion and neck supple.  Cardiovascular:     Rate and Rhythm: Normal rate and regular rhythm.     Pulses: Normal pulses.     Heart sounds: Normal heart sounds. No murmur. No friction rub. No gallop.   Pulmonary:     Effort: Pulmonary effort is normal. No respiratory distress.     Breath sounds: Normal breath  sounds. No stridor. No wheezing, rhonchi or rales.  Chest:     Chest wall: No tenderness.  Musculoskeletal: Normal range of motion.  Skin:    General: Skin is warm and dry.     Capillary Refill: Capillary refill takes less than 2 seconds.     Coloration: Skin is not jaundiced or pale.     Findings: No bruising, erythema, lesion or rash.  Neurological:     General: No focal deficit present.     Mental Status: He is alert and oriented to person, place, and time. Mental status is at baseline.  Psychiatric:        Mood and Affect: Mood normal.        Behavior: Behavior normal.        Thought Content: Thought content normal.        Judgment: Judgment normal.     Results for orders placed or performed in visit on 01/29/18  CBC with Differential/Platelet  Result Value Ref Range   WBC 6.5 3.4 - 10.8 x10E3/uL   RBC 4.09 (L) 4.14 - 5.80 x10E6/uL   Hemoglobin 13.5 13.0 - 17.7 g/dL   Hematocrit 40.3 47.4 - 51.0 %   MCV 100 (H) 79 - 97 fL   MCH 33.0 26.6 - 33.0 pg   MCHC 32.9 31.5 - 35.7 g/dL   RDW 25.9 56.3 - 87.5 %   Platelets 248 150 - 450 x10E3/uL   Neutrophils 64 Not Estab. %   Lymphs 20 Not Estab. %   Monocytes 13 Not Estab. %   Eos 3 Not Estab. %   Basos 0 Not Estab. %   Neutrophils Absolute 4.2 1.4 - 7.0 x10E3/uL   Lymphocytes Absolute 1.3 0.7 - 3.1 x10E3/uL   Monocytes Absolute 0.8 0.1 - 0.9 x10E3/uL   EOS (ABSOLUTE) 0.2 0.0 - 0.4 x10E3/uL   Basophils Absolute 0.0 0.0 - 0.2 x10E3/uL   Immature Granulocytes 0 Not Estab. %   Immature Grans (Abs) 0.0 0.0 - 0.1 x10E3/uL  Comprehensive metabolic panel  Result Value Ref Range   Glucose 108 (H) 65 - 99 mg/dL   BUN 13 8 - 27 mg/dL   Creatinine, Ser 6.43 0.76 - 1.27 mg/dL   GFR calc non Af Amer 82 >59 mL/min/1.73   GFR calc Af Amer 95 >59 mL/min/1.73   BUN/Creatinine Ratio 14 10 - 24   Sodium 140 134 - 144 mmol/L   Potassium 4.6 3.5 - 5.2 mmol/L   Chloride 97 96 - 106 mmol/L   CO2 27 20 - 29 mmol/L   Calcium 9.1 8.6 - 10.2  mg/dL   Total Protein 6.4 6.0 - 8.5 g/dL   Albumin 4.0 3.5 - 4.8 g/dL   Globulin, Total 2.4 1.5 - 4.5 g/dL   Albumin/Globulin Ratio 1.7 1.2 - 2.2  Bilirubin Total 0.8 0.0 - 1.2 mg/dL   Alkaline Phosphatase 66 39 - 117 IU/L   AST 25 0 - 40 IU/L   ALT 9 0 - 44 IU/L  Lipid Panel w/o Chol/HDL Ratio  Result Value Ref Range   Cholesterol, Total 176 100 - 199 mg/dL   Triglycerides 657 0 - 149 mg/dL   HDL 43 >84 mg/dL   VLDL Cholesterol Cal 27 5 - 40 mg/dL   LDL Calculated 696 (H) 0 - 99 mg/dL  Microalbumin, Urine Waived  Result Value Ref Range   Microalb, Ur Waived 10 0 - 19 mg/L   Creatinine, Urine Waived 50 10 - 300 mg/dL   Microalb/Creat Ratio <30 <30 mg/g  UA/M w/rflx Culture, Routine  Result Value Ref Range   Specific Gravity, UA 1.015 1.005 - 1.030   pH, UA 7.0 5.0 - 7.5   Color, UA Yellow Yellow   Appearance Ur Clear Clear   Leukocytes, UA Negative Negative   Protein, UA Negative Negative/Trace   Glucose, UA Negative Negative   Ketones, UA Negative Negative   RBC, UA Negative Negative   Bilirubin, UA Negative Negative   Urobilinogen, Ur 1.0 0.2 - 1.0 mg/dL   Nitrite, UA Negative Negative      Assessment & Plan:   Problem List Items Addressed This Visit      Cardiovascular and Mediastinum   Chronic diastolic heart failure (HCC) (Chronic)    Stable. Follows with Dr. Mariah Milling. Weight has been stable. Call with any concerns.       Relevant Medications   atorvastatin (LIPITOR) 40 MG tablet   metoprolol tartrate (LOPRESSOR) 50 MG tablet   Other Relevant Orders   Comprehensive metabolic panel   CBC with Differential/Platelet   TSH   UA/M w/rflx Culture, Routine   HTN (hypertension) - Primary    Under good control on current regimen. Refills given today. Follow up 6 months. Labs checked today. Call with any concerns.       Relevant Medications   atorvastatin (LIPITOR) 40 MG tablet   metoprolol tartrate (LOPRESSOR) 50 MG tablet   Other Relevant Orders    Comprehensive metabolic panel   CBC with Differential/Platelet   Microalbumin, Urine Waived   TSH   UA/M w/rflx Culture, Routine   Atrial fibrillation with RVR (HCC)    Stable. Follows with Dr. Mariah Milling. Weight has been stable. Call with any concerns.       Relevant Medications   atorvastatin (LIPITOR) 40 MG tablet   metoprolol tartrate (LOPRESSOR) 50 MG tablet   Other Relevant Orders   Referral to Chronic Care Management Services     Other   HLD (hyperlipidemia)    Under good control on current regimen. Refills given today. Follow up 6 months. Labs checked today. Call with any concerns.       Relevant Medications   atorvastatin (LIPITOR) 40 MG tablet   metoprolol tartrate (LOPRESSOR) 50 MG tablet   Other Relevant Orders   Comprehensive metabolic panel   CBC with Differential/Platelet   Lipid Panel w/o Chol/HDL Ratio   TSH   UA/M w/rflx Culture, Routine   Morbid obesity (HCC)    Patient has lost 13 lbs in the last 9 months. Continue slow, steady weight loss with goal of losing 1-2lbs a week. Morbid obesity secondary to HTN and HLD. Call with any concerns. Continue to monitor.       Relevant Orders   Bayer DCA Hb A1c Waived   Depression, major, single episode, in  partial remission (HCC)    Doing very well on his paxil. Continue paxil. Continue to monitor. Call with any concerns.       Relevant Medications   PARoxetine (PAXIL) 20 MG tablet    Other Visit Diagnoses    Hesitancy       Checking PSA today. Await results.    Relevant Orders   PSA       Follow up plan: Return in about 6 months (around 05/09/2019) for Wellness/physical.

## 2018-11-06 NOTE — Assessment & Plan Note (Signed)
Doing very well on his paxil. Continue paxil. Continue to monitor. Call with any concerns.

## 2018-11-06 NOTE — Assessment & Plan Note (Signed)
Stable. Follows with Dr. Gollan. Weight has been stable. Call with any concerns.  

## 2018-11-07 LAB — LIPID PANEL W/O CHOL/HDL RATIO
CHOLESTEROL TOTAL: 174 mg/dL (ref 100–199)
HDL: 42 mg/dL (ref >39)
LDL Calculated: 105 mg/dL — ABNORMAL HIGH (ref 0–99)
Triglycerides: 135 mg/dL (ref 0–149)
VLDL CHOLESTEROL CAL: 27 mg/dL (ref 5–40)

## 2018-11-07 LAB — COMPREHENSIVE METABOLIC PANEL
ALBUMIN: 3.5 g/dL — AB (ref 3.7–4.7)
ALT: 5 IU/L (ref 0–44)
AST: 18 IU/L (ref 0–40)
Albumin/Globulin Ratio: 1.5 (ref 1.2–2.2)
Alkaline Phosphatase: 68 IU/L (ref 39–117)
BUN / CREAT RATIO: 6 — AB (ref 10–24)
BUN: 5 mg/dL — AB (ref 8–27)
Bilirubin Total: 1 mg/dL (ref 0.0–1.2)
CALCIUM: 8.3 mg/dL — AB (ref 8.6–10.2)
CO2: 24 mmol/L (ref 20–29)
CREATININE: 0.79 mg/dL (ref 0.76–1.27)
Chloride: 98 mmol/L (ref 96–106)
GFR, EST AFRICAN AMERICAN: 99 mL/min/{1.73_m2} (ref >59)
GFR, EST NON AFRICAN AMERICAN: 86 mL/min/{1.73_m2} (ref >59)
GLUCOSE: 112 mg/dL — AB (ref 65–99)
Globulin, Total: 2.4 g/dL (ref 1.5–4.5)
Potassium: 3.5 mmol/L (ref 3.5–5.2)
Sodium: 139 mmol/L (ref 134–144)
TOTAL PROTEIN: 5.9 g/dL — AB (ref 6.0–8.5)

## 2018-11-07 LAB — CBC WITH DIFFERENTIAL/PLATELET
BASOS ABS: 0 10*3/uL (ref 0.0–0.2)
Basos: 1 %
EOS (ABSOLUTE): 0.2 10*3/uL (ref 0.0–0.4)
EOS: 3 %
HEMATOCRIT: 41.1 % (ref 37.5–51.0)
HEMOGLOBIN: 14.1 g/dL (ref 13.0–17.7)
IMMATURE GRANS (ABS): 0 10*3/uL (ref 0.0–0.1)
Immature Granulocytes: 0 %
LYMPHS ABS: 1.4 10*3/uL (ref 0.7–3.1)
LYMPHS: 24 %
MCH: 33.2 pg — ABNORMAL HIGH (ref 26.6–33.0)
MCHC: 34.3 g/dL (ref 31.5–35.7)
MCV: 97 fL (ref 79–97)
MONOCYTES: 11 %
Monocytes Absolute: 0.6 10*3/uL (ref 0.1–0.9)
Neutrophils Absolute: 3.7 10*3/uL (ref 1.4–7.0)
Neutrophils: 61 %
Platelets: 195 10*3/uL (ref 150–450)
RBC: 4.25 x10E6/uL (ref 4.14–5.80)
RDW: 13.4 % (ref 11.6–15.4)
WBC: 5.9 10*3/uL (ref 3.4–10.8)

## 2018-11-07 LAB — PSA: Prostate Specific Ag, Serum: 1.6 ng/mL (ref 0.0–4.0)

## 2018-11-07 LAB — TSH: TSH: 3.8 u[IU]/mL (ref 0.450–4.500)

## 2018-11-09 ENCOUNTER — Encounter: Payer: Self-pay | Admitting: Family Medicine

## 2018-11-10 ENCOUNTER — Ambulatory Visit: Payer: Self-pay | Admitting: Pharmacist

## 2018-11-10 DIAGNOSIS — I4891 Unspecified atrial fibrillation: Secondary | ICD-10-CM

## 2018-11-10 NOTE — Chronic Care Management (AMB) (Addendum)
Chronic Care Management   Note  11/10/2018 Name: Michael Rowe MRN: [id number] DOB: 05-Jul-1941   Subjective:   Patient is a 78 year old male seen by Dr. Park Liter with PMH of atrial fibrillation, CHF, HTN, HLD and referred to chronic care management for these conditions, as well as medication assistance.  Spoke with patient's wife, [name], face to face in clinic.   Ms. Spragg, patient's wife and DPR was given information about Chronic Care Management services today including:  1. CCM service includes personalized support from designated clinical staff supervised by his physician, including individualized plan of care and coordination with other care providers 2. 24/7 contact phone numbers for assistance for urgent and routine care needs. 3. Service will only be billed when office clinical staff spend 20 minutes or more in a month to coordinate care. 4. Only one practitioner may furnish and bill the service in a calendar month. 5. The patient may stop CCM services at any time (effective at the end of the month) by phone call to the office staff. 6. The patient will be responsible for cost sharing (co-pay) of up to 20% of the service fee (after annual deductible is met).  Patient's wife agreed to services and verbal consent obtained.    Review of patient status, including review of consultants reports, laboratory and other test data, was performed as part of comprehensive evaluation and provision of chronic care management services.   Objective: Lab Results  Component Value Date   CREATININE 0.79 11/06/2018   CREATININE 0.90 01/29/2018   CREATININE 0.81 11/03/2017    Lab Results  Component Value Date   HGBA1C 5.3 11/06/2018    Lipid Panel     Component Value Date/Time   CHOL 174 11/06/2018 1449   TRIG 135 11/06/2018 1449   HDL 42 11/06/2018 1449   CHOLHDL 3.8 10/01/2017 0441   VLDL 21 10/01/2017 0441   LDLCALC 105 (H) 11/06/2018 1449    BP Readings from Last 3  Encounters:  11/06/18 124/77  02/10/18 129/73  01/29/18 (!) 112/56    No Known Allergies  Medications Reviewed Today    Reviewed by Valerie Roys, DO (Physician) on 11/06/18 at 1510  Med List Status: <None>  Medication Order Taking? Sig Documenting Provider Last Dose Status Informant  atorvastatin (LIPITOR) 40 MG tablet [id number] Yes Take 1 tablet (40 mg total) by mouth daily. Johnson, Megan P, DO  Active   diltiazem (CARDIZEM CD) 120 MG 24 hr capsule [id number] Yes Take 1 capsule (120 mg total) by mouth daily. Minna Merritts, MD Taking Active   furosemide (LASIX) 20 MG tablet [id number] No Take 1 tablet (20 mg total) by mouth daily. Minna Merritts, MD Unknown Active   metoprolol tartrate (LOPRESSOR) 50 MG tablet [id number] Yes Take 1 tablet (50 mg total) by mouth 2 (two) times daily. Johnson, Megan P, DO  Active   PARoxetine (PAXIL) 20 MG tablet [id number]  Take 1 tablet (20 mg total) by mouth daily. Johnson, Megan P, DO  Active   rivaroxaban (XARELTO) 20 MG TABS tablet [id number] Yes Take 1 tablet (20 mg total) by mouth daily with supper. Minna Merritts, MD Taking Active            Assessment:    Goals Addressed            This Visit's Progress     Patient Stated   . "Medications are expensive" (pt-stated)       Current Barriers:  .  Financial Barriers- all other medications are $0 copay, patient's wife reports Xarelto is $45/month; she reports that they tried to apply for Xarelto patient assistance previously and were denied due to income. Additionally, they would need to spend 4% of this income out of pocket at the pharmacy on copayments in 2020 to qualify for assistance  Pharmacist Clinical Goal(s):  Marland Kitchen Over the next 30 days, patient will demonstrate improved understanding of prescribed medications and rationale for usage as evidenced by improved medication management  Interventions: . Explained chronic care management services to patient's wife, as well as the  process for medication assistance; wife noted that since her medications are all $0 and all of the patient's other medications are $0, they can currently afford $45/month for Xarelto . Provided contact information should future questions or concerns arise  Patient Self Care Activities:  . Self administers medications as prescribed  Plan: . Patient will update CCM team with any changes or concerns moving forward  . PharmD will reach out to patient within the next 30 days  Initial goal documentation        Plan:  - PharmD will outreach patient within the next few weeks to assess medication management and adherence.  - Patient will reach out to CM team with any questions or concerns  Catie Darnelle Maffucci, PharmD Clinical Pharmacist [facility] [phone]

## 2018-11-10 NOTE — Chronic Care Management (AMB) (Addendum)
  Chronic Care Management   Note  11/10/2018 Name: Michael Rowe MRN: [id number] DOB: Dec 26, 1940  Patient of Dr. Olevia Perches with CHF, HTN, Afib, HLD referred to chronic care management services for medication assistance with Xarelto.   Contacted patient; left HIPAA compliant message for him to return my call at his convenience.   Follow up plan: If I have not heard back from patient within the week, will outreach again.    [name], PharmD Clinical Pharmacist [facility] [phone]

## 2018-11-10 NOTE — Patient Instructions (Signed)
Visit Information  Goals Addressed            This Visit's Progress     Patient Stated   . "Medications are expensive" (pt-stated)       Current Barriers:  . Financial Barriers- all other medications are $0 copay, patient's wife reports Xarelto is $45/month; she reports that they tried to apply for Xarelto patient assistance previously and were denied due to income. Additionally, they would need to spend 4% of this income out of pocket at the pharmacy on copayments in 2020 to qualify for assistance  Pharmacist Clinical Goal(s):  Marland Kitchen Over the next 30 days, patient will demonstrate improved understanding of prescribed medications and rationale for usage as evidenced by improved medication management  Interventions: . Explained chronic care management services to patient's wife, as well as the process for medication assistance; wife noted that since her medications are all $0 and all of the patient's other medications are $0, they can currently afford $45/month for Xarelto . Provided contact information should future questions or concerns arise  Patient Self Care Activities:  . Self administers medications as prescribed  Plan: . Patient will update CCM team with any changes or concerns moving forward  . PharmD will reach out to patient within the next 30 days  Initial goal documentation        Michael Rowe was given information about Chronic Care Management services today including:  1. CCM service includes personalized support from designated clinical staff supervised by his physician, including individualized plan of care and coordination with other care providers 2. 24/7 contact phone numbers for assistance for urgent and routine care needs. 3. Service will only be billed when office clinical staff spend 20 minutes or more in a month to coordinate care. 4. Only one practitioner may furnish and bill the service in a calendar month. 5. The patient may stop CCM services at any time  (effective at the end of the month) by phone call to the office staff. 6. The patient will be responsible for cost sharing (co-pay) of up to 20% of the service fee (after annual deductible is met).  Patient's wife agreed to services and verbal consent obtained.   The patient verbalized understanding of instructions provided today and declined a print copy of patient instruction materials.   - PharmD will outreach patient within the next few weeks to assess medication management and adherence.  - Patient will reach out to CM team with any questions or concerns  Catie Darnelle Maffucci, PharmD Clinical Pharmacist [place] [phone]

## 2018-11-11 ENCOUNTER — Ambulatory Visit: Payer: Medicare HMO | Admitting: Pharmacist

## 2018-11-11 ENCOUNTER — Telehealth: Payer: Self-pay

## 2018-11-11 ENCOUNTER — Other Ambulatory Visit: Payer: Self-pay

## 2018-11-11 DIAGNOSIS — I4891 Unspecified atrial fibrillation: Secondary | ICD-10-CM

## 2018-11-11 MED ORDER — RIVAROXABAN 20 MG PO TABS: 20.0000 mg | ORAL_TABLET | Freq: Every day | ORAL | 6 refills | Status: DC

## 2018-11-11 NOTE — Chronic Care Management (AMB) (Signed)
  Chronic Care Management   Follow Up Note   11/11/2018 Name: Michael Rowe MRN: [id number] DOB: 1941/06/30  Referred by: Dorcas Carrow, DO Reason for referral : Chronic Care Management (Care Coordination)   Michael Rowe is a 78 y.o. year old male who is a primary care patient of Dorcas Carrow, DO. The CCM team was consulted for assistance with chronic disease management and care coordination needs.    Review of patient status, including review of consultants reports, relevant laboratory and other test results, and collaboration with appropriate care team members and the patient's provider was performed as part of comprehensive patient evaluation and provision of chronic care management services.    Goals Addressed            This Visit's Progress     Patient Stated   . "Medications are expensive" (pt-stated)       Current Barriers:  . Financial Barriers- all other medications are $0 copay, patient's wife reports Xarelto is $45/month; she reports that they tried to apply for Xarelto patient assistance previously and were denied due to income. Additionally, they would need to spend 4% of this income out of pocket at the pharmacy on copayments in 2020 to qualify for assistance . Patient requested Xarelto be sent to the [facility]  Pharmacist Clinical Goal(s):  Marland Kitchen Over the next 30 days, patient will demonstrate improved understanding of prescribed medications and rationale for usage as evidenced by improved medication management  Interventions: . Contacted [facility]; requested to send a refill of Xarelto to [facility]   Patient Self Care Activities:  . Self administers medications as prescribed  Plan: . Patient will update CCM team with any changes or concerns moving forward  . PharmD will reach out to patient within the next 30 days  Please see past updates related to this goal by clicking on the "Past Updates" button in the selected  goal          - PharmD will reach out to patient within 30 days for medication management assistance  [name], PharmD Clinical Pharmacist [facility] [phone]

## 2018-11-11 NOTE — Telephone Encounter (Signed)
Patient last seen 11/06/18.

## 2018-11-11 NOTE — Patient Instructions (Signed)
Visit Information  Goals Addressed            This Visit's Progress     Patient Stated   . "Medications are expensive" (pt-stated)       Current Barriers:  . Financial Barriers- all other medications are $0 copay, patient's wife reports Xarelto is $45/month; she reports that they tried to apply for Xarelto patient assistance previously and were denied due to income. Additionally, they would need to spend 4% of this income out of pocket at the pharmacy on copayments in 2020 to qualify for assistance . Patient requested Xarelto be sent to the [facility]  Pharmacist Clinical Goal(s):  Marland Kitchen Over the next 30 days, patient will demonstrate improved understanding of prescribed medications and rationale for usage as evidenced by improved medication management  Interventions: . Contacted [facility]; requested to send a refill of Xarelto to [facility]   Patient Self Care Activities:  . Self administers medications as prescribed  Plan: . Patient will update CCM team with any changes or concerns moving forward  . PharmD will reach out to patient within the next 30 days  Please see past updates related to this goal by clicking on the "Past Updates" button in the selected goal         The patient verbalized understanding of instructions provided today and declined a print copy of patient instruction materials.   Plan - PharmD will outreach patient within the next 30 days for medication management follow up  [name], PharmD Clinical Pharmacist [facility] [phone]

## 2018-11-11 NOTE — Telephone Encounter (Signed)
Prior Authorization initiated for Paxil.  PA was approved until 09/02/2019. Key: [id number]

## 2018-11-11 NOTE — Telephone Encounter (Signed)
Those are written by his cardiologist.

## 2018-11-11 NOTE — Telephone Encounter (Signed)
*  STAT* If patient is at the pharmacy, call can be transferred to refill team.   1. Which medications need to be refilled? (please list name of each medication and dose if known) Xarelto  2. Which pharmacy/location (including street and city if local pharmacy) is medication to be sent to? [facility]   3. Do they need a 30 day or 90 day supply? 30

## 2018-11-11 NOTE — Telephone Encounter (Signed)
Pt's wt 115.2 kg, age 78, serum creatinine 0.79 (11/06/18), CrCl 125.57.

## 2018-11-12 ENCOUNTER — Telehealth: Payer: Self-pay | Admitting: Family Medicine

## 2018-11-12 NOTE — Telephone Encounter (Signed)
Patient states he received a call from [place] but I think it may have been you Keri.  Thank You

## 2018-11-16 ENCOUNTER — Ambulatory Visit: Payer: Self-pay | Admitting: Pharmacist

## 2018-11-16 DIAGNOSIS — I4891 Unspecified atrial fibrillation: Secondary | ICD-10-CM

## 2018-11-16 NOTE — Chronic Care Management (AMB) (Signed)
  Chronic Care Management   Follow Up Note   11/16/2018 Name: Michael Rowe MRN: [id number] DOB: 12-Jan-1941  Referred by: Dorcas Carrow, DO Reason for referral : Chronic Care Management (Medication Management)   Michael Rowe is a 78 y.o. year old male who is a primary care patient of Dorcas Carrow, DO. The CCM team was consulted for assistance with chronic disease management and care coordination needs.    Patient returned my call.   Review of patient status, including review of consultants reports, relevant laboratory and other test results, and collaboration with appropriate care team members and the patient's provider was performed as part of comprehensive patient evaluation and provision of chronic care management services.    Goals Addressed            This Visit's Progress     Patient Stated   . COMPLETED: "Medications are expensive" (pt-stated)       Current Barriers:  . Financial Barriers- all other medications are $0 copay, patient's wife reports Xarelto is $45/month; she reports that they tried to apply for Xarelto patient assistance previously and were denied due to income. Additionally, they would need to spend 4% of this income out of pocket at the pharmacy on copayments in 2020 to qualify for assistance  Pharmacist Clinical Goal(s):  Marland Kitchen Over the next 30 days, patient will demonstrate improved understanding of prescribed medications and rationale for usage as evidenced by improved medication management  Interventions: . Spoke with patient; reiterated the chronic care management program and services available. He notes that no assistance is needed at this time, and appreciates the support  Patient Self Care Activities:  . Self administers medications as prescribed  Plan: . Patient will update CCM team with any changes or concerns moving forward   Please see past updates related to this goal by clicking on the "Past Updates" button in the selected  goal          Plan - No immediate action needed at this time. PharmD will remain on patient's care team as a resource.  - Patient will reach out to CCM team with any future needs or concerns  [name], PharmD Clinical Pharmacist [facility] [phone]

## 2018-11-16 NOTE — Patient Instructions (Signed)
Visit Information  Goals Addressed            This Visit's Progress     Patient Stated   . COMPLETED: "Medications are expensive" (pt-stated)       Current Barriers:  . Financial Barriers- all other medications are $0 copay, patient's wife reports Xarelto is $45/month; she reports that they tried to apply for Xarelto patient assistance previously and were denied due to income. Additionally, they would need to spend 4% of this income out of pocket at the pharmacy on copayments in 2020 to qualify for assistance  Pharmacist Clinical Goal(s):  Marland Kitchen Over the next 30 days, patient will demonstrate improved understanding of prescribed medications and rationale for usage as evidenced by improved medication management  Interventions: . Spoke with patient; reiterated the chronic care management program and services available. He notes that no assistance is needed at this time, and appreciates the support  Patient Self Care Activities:  . Self administers medications as prescribed  Plan: . Patient will update CCM team with any changes or concerns moving forward   Please see past updates related to this goal by clicking on the "Past Updates" button in the selected goal         The patient verbalized understanding of instructions provided today and declined a print copy of patient instruction materials.   Plan - No immediate action needed at this time. PharmD will remain on patient's care team as a resource.  - Patient will reach out to CCM team with any future needs or concerns  [name], PharmD Clinical Pharmacist [facility] [phone]

## 2018-11-16 NOTE — Telephone Encounter (Signed)
Contacted patient. See Chronic Care Management documentation dated 11/16/2018.   [name], PharmD Clinical Pharmacist [facility] [phone]

## 2018-11-16 NOTE — Telephone Encounter (Signed)
I got his Paxil approved with insurance but I think Catie, Pharm, D with [facility] or Santina Evans called patient. But it looks like they may have spoken with each other after the telephone call but not positive. Told patient to call us with any questions. Routing to Catie.

## 2018-11-19 ENCOUNTER — Other Ambulatory Visit: Payer: Self-pay

## 2018-11-19 MED ORDER — FUROSEMIDE 20 MG PO TABS: 20.0000 mg | ORAL_TABLET | Freq: Every day | ORAL | 2 refills | Status: DC

## 2018-11-19 MED ORDER — DILTIAZEM HCL ER COATED BEADS 120 MG PO CP24: 120.0000 mg | ORAL_CAPSULE | Freq: Every day | ORAL | 2 refills | Status: DC

## 2018-11-19 NOTE — Telephone Encounter (Signed)
Michael Rowe called in stating she is very angry and needs to speak to an office manager or Dr. Laural Benes. I attempted the office (closed 12-1pm) and no answer. While skyping I was able to determine Michael Rowe was upset that a denial for diltiazem and furosemide was sent back to [facility] pharmacy. She just received a notification today from [facility]. I apologized and advised that we did deny refills as they should be prescribed by Dr. Mariah Milling w/cardiology but that the denial was sent back to [facility] the day they requested (11/11/2018). Michael Rowe apologized for being upset and I provided her ph# for Dr. Windell Hummingbird office to request medication refills.

## 2018-11-19 NOTE — Telephone Encounter (Signed)
*  STAT* If patient is at the pharmacy, call can be transferred to refill team.   1. Which medications need to be refilled? (please list name of each medication and dose if known) Cardizem and Lasix  2. Which pharmacy/location (including street and city if local pharmacy) is medication to be sent to? [facility]   3. Do they need a 30 day or 90 day supply? 90

## 2018-11-19 NOTE — Telephone Encounter (Signed)
Just FYI.

## 2018-11-24 ENCOUNTER — Telehealth: Payer: Self-pay | Admitting: Cardiovascular Disease

## 2018-11-24 NOTE — Telephone Encounter (Signed)
Pt left a message following up on prescription refill. Would like a call back at [phone].

## 2018-11-25 ENCOUNTER — Other Ambulatory Visit: Payer: Self-pay

## 2018-11-25 MED ORDER — FUROSEMIDE 20 MG PO TABS: 20.0000 mg | ORAL_TABLET | Freq: Every day | ORAL | 1 refills | Status: DC

## 2018-11-25 MED ORDER — DILTIAZEM HCL ER COATED BEADS 120 MG PO CP24
120.0000 mg | ORAL_CAPSULE | Freq: Every day | ORAL | 1 refills | Status: DC
Start: 2018-11-25 — End: 2019-06-25

## 2019-05-11 ENCOUNTER — Encounter: Payer: Self-pay | Admitting: Cardiovascular Disease

## 2019-05-11 NOTE — Telephone Encounter (Signed)
This encounter was created in error - please disregard.

## 2019-05-13 ENCOUNTER — Ambulatory Visit: Payer: Medicare HMO

## 2019-05-13 ENCOUNTER — Encounter: Payer: Medicare HMO | Admitting: Family Medicine

## 2019-05-18 ENCOUNTER — Other Ambulatory Visit: Payer: Self-pay | Admitting: Family Medicine

## 2019-05-18 NOTE — Telephone Encounter (Signed)
Needs appointment

## 2019-05-18 NOTE — Telephone Encounter (Signed)
Requested medication (s) are due for refill today: yes  Requested medication (s) are on the active medication list: yes  Last refill:  01/21/2019  Future visit scheduled: no  Notes to clinic: review for refill   Requested Prescriptions  Pending Prescriptions Disp Refills   atorvastatin (LIPITOR) 40 MG tablet [Pharmacy Med Name: ATORVASTATIN CALCIUM 40 MG Tablet] 90 tablet 1    Sig: TAKE 1 TABLET EVERY DAY     Cardiovascular:  Antilipid - Statins Failed - 05/18/2019  3:17 PM      Failed - LDL in normal range and within 360 days    LDL Calculated  Date Value Ref Range Status  11/06/2018 105 (H) 0 - 99 mg/dL Final         Passed - Total Cholesterol in normal range and within 360 days    Cholesterol, Total  Date Value Ref Range Status  11/06/2018 174 100 - 199 mg/dL Final         Passed - HDL in normal range and within 360 days    HDL  Date Value Ref Range Status  11/06/2018 42 >39 mg/dL Final         Passed - Triglycerides in normal range and within 360 days    Triglycerides  Date Value Ref Range Status  11/06/2018 135 0 - 149 mg/dL Final         Passed - Patient is not pregnant      Passed - Valid encounter within last 12 months    Recent Outpatient Visits          6 months ago Essential hypertension   [place], [place], DO   1 year ago Dizziness   [place], [place], DO   1 year ago Chronic diastolic heart failure [facility])   [place], Megan P, DO

## 2019-05-19 ENCOUNTER — Encounter: Payer: Self-pay | Admitting: Family Medicine

## 2019-05-19 NOTE — Telephone Encounter (Signed)
LVM for pt call back for appt and mailing letter.

## 2019-06-01 ENCOUNTER — Telehealth: Payer: Self-pay

## 2019-06-01 NOTE — Telephone Encounter (Signed)
Tried calling patient to see if he wanted to receive his pneumonia vaccine. Patient also due for a flu shot, AWV and follow-up.  No answer, LVM for patient to return phone call.

## 2019-06-09 ENCOUNTER — Other Ambulatory Visit: Payer: Self-pay | Admitting: Family Medicine

## 2019-06-09 NOTE — Telephone Encounter (Signed)
Requested medication (s) are due for refill today: yes  Requested medication (s) are on the active medication list: yes  Last refill: 05/21/2019  Future visit scheduled:no  Notes to clinic: review for refill   Requested Prescriptions  Pending Prescriptions Disp Refills   atorvastatin (LIPITOR) 40 MG tablet [Pharmacy Med Name: ATORVASTATIN CALCIUM 40 MG Tablet] 30 tablet 0    Sig: TAKE 1 TABLET EVERY DAY (PLEASE CALL FOR AN APPOINTMENT FOR MORE REFILLS)     Cardiovascular:  Antilipid - Statins Failed - 06/09/2019  3:47 AM      Failed - LDL in normal range and within 360 days    LDL Calculated  Date Value Ref Range Status  11/06/2018 105 (H) 0 - 99 mg/dL Final         Passed - Total Cholesterol in normal range and within 360 days    Cholesterol, Total  Date Value Ref Range Status  11/06/2018 174 100 - 199 mg/dL Final         Passed - HDL in normal range and within 360 days    HDL  Date Value Ref Range Status  11/06/2018 42 >39 mg/dL Final         Passed - Triglycerides in normal range and within 360 days    Triglycerides  Date Value Ref Range Status  11/06/2018 135 0 - 149 mg/dL Final         Passed - Patient is not pregnant      Passed - Valid encounter within last 12 months    Recent Outpatient Visits          7 months ago Essential hypertension   [place], [place], DO   1 year ago Dizziness   [place], [place], DO   1 year ago Chronic diastolic heart failure [facility])   [place], Megan P, DO

## 2019-06-09 NOTE — Telephone Encounter (Signed)
Needs an appointment. Will get him enough medicine to make it to appointment when it's booked.   

## 2019-06-09 NOTE — Telephone Encounter (Signed)
Called pt to schedule an appt for medication refill, he states that he will have to call back to schedule

## 2019-06-09 NOTE — Telephone Encounter (Signed)
Routing to close encounter. 

## 2019-06-09 NOTE — Telephone Encounter (Signed)
Needs appointment

## 2019-06-25 ENCOUNTER — Other Ambulatory Visit: Payer: Self-pay | Admitting: Cardiovascular Disease

## 2019-07-19 ENCOUNTER — Other Ambulatory Visit: Payer: Self-pay | Admitting: Cardiovascular Disease

## 2019-07-24 ENCOUNTER — Other Ambulatory Visit: Payer: Self-pay | Admitting: Family Medicine

## 2019-08-16 ENCOUNTER — Other Ambulatory Visit: Payer: Self-pay | Admitting: Family Medicine

## 2019-08-16 NOTE — Telephone Encounter (Signed)
Called pt to schedule, he states that he no longer comes here and has a new provider.

## 2019-08-16 NOTE — Telephone Encounter (Signed)
Needs appointment

## 2019-08-16 NOTE — Telephone Encounter (Signed)
Requested medication (s) are due for refill today: yes  Requested medication (s) are on the active medication list: yes  Last refill: 07/26/2019  Future visit scheduled:no  Notes to clinic:  Lov-11/06/2018 Patient was advise that he needs office visit    Requested Prescriptions  Pending Prescriptions Disp Refills   metoprolol tartrate (LOPRESSOR) 50 MG tablet [Pharmacy Med Name: METOPROLOL TARTRATE 50 MG Tablet] 60 tablet 0    Sig: TAKE 1 TABLET TWICE DAILY (NEED OFFICE VISIT FOR FUTURE REFILLS)      Cardiovascular:  Beta Blockers Failed - 08/16/2019  3:26 AM      Failed - Valid encounter within last 6 months    Recent Outpatient Visits           9 months ago Essential hypertension   [place], [place], DO   1 year ago Dizziness   [place], [place], DO   1 year ago Chronic diastolic heart failure [facility])   [facility], Megan P, DO              Passed - Last BP in normal range    BP Readings from Last 1 Encounters:  11/06/18 124/77          Passed - Last Heart Rate in normal range    Pulse Readings from Last 1 Encounters:  11/06/18 66

## 2019-08-17 ENCOUNTER — Ambulatory Visit: Payer: Self-pay | Admitting: Pharmacist

## 2019-08-17 NOTE — Chronic Care Management (AMB) (Signed)
Error.   Received call from patient's caregiver. However, after I opened this encounter, I realized the patient's wife was calling to ask me a question about herself.   Michael Rowe, PharmD, [place] [phone]

## 2019-09-23 DIAGNOSIS — Z7689 Persons encountering health services in other specified circumstances: Secondary | ICD-10-CM | POA: Diagnosis not present

## 2019-09-23 DIAGNOSIS — R2689 Other abnormalities of gait and mobility: Secondary | ICD-10-CM | POA: Diagnosis not present

## 2019-09-23 DIAGNOSIS — Z0000 Encounter for general adult medical examination without abnormal findings: Secondary | ICD-10-CM | POA: Diagnosis not present

## 2019-09-23 DIAGNOSIS — Z1159 Encounter for screening for other viral diseases: Secondary | ICD-10-CM | POA: Diagnosis not present

## 2019-09-23 DIAGNOSIS — I482 Chronic atrial fibrillation, unspecified: Secondary | ICD-10-CM | POA: Diagnosis not present

## 2019-09-23 DIAGNOSIS — Z79899 Other long term (current) drug therapy: Secondary | ICD-10-CM | POA: Diagnosis not present

## 2019-09-23 DIAGNOSIS — I5032 Chronic diastolic (congestive) heart failure: Secondary | ICD-10-CM | POA: Diagnosis not present

## 2019-09-23 DIAGNOSIS — I110 Hypertensive heart disease with heart failure: Secondary | ICD-10-CM | POA: Diagnosis not present

## 2019-09-23 DIAGNOSIS — E669 Obesity, unspecified: Secondary | ICD-10-CM | POA: Diagnosis not present

## 2019-09-23 DIAGNOSIS — I10 Essential (primary) hypertension: Secondary | ICD-10-CM | POA: Diagnosis not present

## 2019-09-23 DIAGNOSIS — Z6839 Body mass index (BMI) 39.0-39.9, adult: Secondary | ICD-10-CM | POA: Diagnosis not present

## 2019-09-23 DIAGNOSIS — E782 Mixed hyperlipidemia: Secondary | ICD-10-CM | POA: Diagnosis not present

## 2019-09-23 DIAGNOSIS — F324 Major depressive disorder, single episode, in partial remission: Secondary | ICD-10-CM | POA: Diagnosis not present

## 2019-09-29 DIAGNOSIS — R262 Difficulty in walking, not elsewhere classified: Secondary | ICD-10-CM | POA: Diagnosis not present

## 2019-09-29 DIAGNOSIS — M545 Low back pain: Secondary | ICD-10-CM | POA: Diagnosis not present

## 2019-09-29 DIAGNOSIS — R2689 Other abnormalities of gait and mobility: Secondary | ICD-10-CM | POA: Diagnosis not present

## 2019-09-29 DIAGNOSIS — G8929 Other chronic pain: Secondary | ICD-10-CM | POA: Diagnosis not present

## 2019-10-04 DIAGNOSIS — G8929 Other chronic pain: Secondary | ICD-10-CM | POA: Diagnosis not present

## 2019-10-04 DIAGNOSIS — W19XXXA Unspecified fall, initial encounter: Secondary | ICD-10-CM | POA: Diagnosis not present

## 2019-10-04 DIAGNOSIS — R2689 Other abnormalities of gait and mobility: Secondary | ICD-10-CM | POA: Diagnosis not present

## 2019-10-04 DIAGNOSIS — M6281 Muscle weakness (generalized): Secondary | ICD-10-CM | POA: Diagnosis not present

## 2019-10-04 DIAGNOSIS — M545 Low back pain: Secondary | ICD-10-CM | POA: Diagnosis not present

## 2019-10-04 DIAGNOSIS — R262 Difficulty in walking, not elsewhere classified: Secondary | ICD-10-CM | POA: Diagnosis not present

## 2019-10-07 DIAGNOSIS — G8929 Other chronic pain: Secondary | ICD-10-CM | POA: Diagnosis not present

## 2019-10-07 DIAGNOSIS — R262 Difficulty in walking, not elsewhere classified: Secondary | ICD-10-CM | POA: Diagnosis not present

## 2019-10-07 DIAGNOSIS — W19XXXA Unspecified fall, initial encounter: Secondary | ICD-10-CM | POA: Diagnosis not present

## 2019-10-07 DIAGNOSIS — M6281 Muscle weakness (generalized): Secondary | ICD-10-CM | POA: Diagnosis not present

## 2019-10-07 DIAGNOSIS — R2689 Other abnormalities of gait and mobility: Secondary | ICD-10-CM | POA: Diagnosis not present

## 2019-10-07 DIAGNOSIS — M545 Low back pain: Secondary | ICD-10-CM | POA: Diagnosis not present

## 2019-10-11 DIAGNOSIS — R262 Difficulty in walking, not elsewhere classified: Secondary | ICD-10-CM | POA: Diagnosis not present

## 2019-10-11 DIAGNOSIS — M6281 Muscle weakness (generalized): Secondary | ICD-10-CM | POA: Diagnosis not present

## 2019-10-11 DIAGNOSIS — M545 Low back pain: Secondary | ICD-10-CM | POA: Diagnosis not present

## 2019-10-11 DIAGNOSIS — R2689 Other abnormalities of gait and mobility: Secondary | ICD-10-CM | POA: Diagnosis not present

## 2019-10-11 DIAGNOSIS — W19XXXA Unspecified fall, initial encounter: Secondary | ICD-10-CM | POA: Diagnosis not present

## 2019-10-11 DIAGNOSIS — G8929 Other chronic pain: Secondary | ICD-10-CM | POA: Diagnosis not present

## 2019-10-18 DIAGNOSIS — G8929 Other chronic pain: Secondary | ICD-10-CM | POA: Diagnosis not present

## 2019-10-18 DIAGNOSIS — M6281 Muscle weakness (generalized): Secondary | ICD-10-CM | POA: Diagnosis not present

## 2019-10-18 DIAGNOSIS — M545 Low back pain: Secondary | ICD-10-CM | POA: Diagnosis not present

## 2019-10-18 DIAGNOSIS — R262 Difficulty in walking, not elsewhere classified: Secondary | ICD-10-CM | POA: Diagnosis not present

## 2019-10-18 DIAGNOSIS — W19XXXA Unspecified fall, initial encounter: Secondary | ICD-10-CM | POA: Diagnosis not present

## 2019-10-18 DIAGNOSIS — R2689 Other abnormalities of gait and mobility: Secondary | ICD-10-CM | POA: Diagnosis not present

## 2019-10-25 DIAGNOSIS — M6281 Muscle weakness (generalized): Secondary | ICD-10-CM | POA: Diagnosis not present

## 2019-10-25 DIAGNOSIS — G8929 Other chronic pain: Secondary | ICD-10-CM | POA: Diagnosis not present

## 2019-10-25 DIAGNOSIS — R262 Difficulty in walking, not elsewhere classified: Secondary | ICD-10-CM | POA: Diagnosis not present

## 2019-10-25 DIAGNOSIS — M545 Low back pain: Secondary | ICD-10-CM | POA: Diagnosis not present

## 2019-10-25 DIAGNOSIS — R2689 Other abnormalities of gait and mobility: Secondary | ICD-10-CM | POA: Diagnosis not present

## 2019-10-25 DIAGNOSIS — W19XXXA Unspecified fall, initial encounter: Secondary | ICD-10-CM | POA: Diagnosis not present

## 2019-10-28 DIAGNOSIS — G8929 Other chronic pain: Secondary | ICD-10-CM | POA: Diagnosis not present

## 2019-10-28 DIAGNOSIS — R262 Difficulty in walking, not elsewhere classified: Secondary | ICD-10-CM | POA: Diagnosis not present

## 2019-10-28 DIAGNOSIS — R2689 Other abnormalities of gait and mobility: Secondary | ICD-10-CM | POA: Diagnosis not present

## 2019-10-28 DIAGNOSIS — M6281 Muscle weakness (generalized): Secondary | ICD-10-CM | POA: Diagnosis not present

## 2019-10-28 DIAGNOSIS — W19XXXA Unspecified fall, initial encounter: Secondary | ICD-10-CM | POA: Diagnosis not present

## 2019-10-28 DIAGNOSIS — M545 Low back pain: Secondary | ICD-10-CM | POA: Diagnosis not present

## 2019-11-02 DIAGNOSIS — G8929 Other chronic pain: Secondary | ICD-10-CM | POA: Diagnosis not present

## 2019-11-02 DIAGNOSIS — W19XXXA Unspecified fall, initial encounter: Secondary | ICD-10-CM | POA: Diagnosis not present

## 2019-11-02 DIAGNOSIS — R2689 Other abnormalities of gait and mobility: Secondary | ICD-10-CM | POA: Diagnosis not present

## 2019-11-02 DIAGNOSIS — M6281 Muscle weakness (generalized): Secondary | ICD-10-CM | POA: Diagnosis not present

## 2019-11-02 DIAGNOSIS — M545 Low back pain: Secondary | ICD-10-CM | POA: Diagnosis not present

## 2019-11-02 DIAGNOSIS — R262 Difficulty in walking, not elsewhere classified: Secondary | ICD-10-CM | POA: Diagnosis not present

## 2019-11-05 DIAGNOSIS — R2689 Other abnormalities of gait and mobility: Secondary | ICD-10-CM | POA: Diagnosis not present

## 2019-11-05 DIAGNOSIS — M6281 Muscle weakness (generalized): Secondary | ICD-10-CM | POA: Diagnosis not present

## 2019-11-05 DIAGNOSIS — M545 Low back pain: Secondary | ICD-10-CM | POA: Diagnosis not present

## 2019-11-05 DIAGNOSIS — G8929 Other chronic pain: Secondary | ICD-10-CM | POA: Diagnosis not present

## 2019-11-05 DIAGNOSIS — W19XXXA Unspecified fall, initial encounter: Secondary | ICD-10-CM | POA: Diagnosis not present

## 2019-11-05 DIAGNOSIS — R262 Difficulty in walking, not elsewhere classified: Secondary | ICD-10-CM | POA: Diagnosis not present

## 2019-11-10 DIAGNOSIS — R2689 Other abnormalities of gait and mobility: Secondary | ICD-10-CM | POA: Diagnosis not present

## 2019-11-10 DIAGNOSIS — W19XXXA Unspecified fall, initial encounter: Secondary | ICD-10-CM | POA: Diagnosis not present

## 2019-11-10 DIAGNOSIS — R262 Difficulty in walking, not elsewhere classified: Secondary | ICD-10-CM | POA: Diagnosis not present

## 2019-11-10 DIAGNOSIS — M545 Low back pain: Secondary | ICD-10-CM | POA: Diagnosis not present

## 2019-11-10 DIAGNOSIS — G8929 Other chronic pain: Secondary | ICD-10-CM | POA: Diagnosis not present

## 2019-11-10 DIAGNOSIS — M6281 Muscle weakness (generalized): Secondary | ICD-10-CM | POA: Diagnosis not present

## 2019-11-16 DIAGNOSIS — M545 Low back pain: Secondary | ICD-10-CM | POA: Diagnosis not present

## 2019-11-16 DIAGNOSIS — R2689 Other abnormalities of gait and mobility: Secondary | ICD-10-CM | POA: Diagnosis not present

## 2019-11-16 DIAGNOSIS — R262 Difficulty in walking, not elsewhere classified: Secondary | ICD-10-CM | POA: Diagnosis not present

## 2019-11-16 DIAGNOSIS — M6281 Muscle weakness (generalized): Secondary | ICD-10-CM | POA: Diagnosis not present

## 2019-11-16 DIAGNOSIS — W19XXXD Unspecified fall, subsequent encounter: Secondary | ICD-10-CM | POA: Diagnosis not present

## 2019-11-16 DIAGNOSIS — G8929 Other chronic pain: Secondary | ICD-10-CM | POA: Diagnosis not present

## 2019-11-19 DIAGNOSIS — G8929 Other chronic pain: Secondary | ICD-10-CM | POA: Diagnosis not present

## 2019-11-19 DIAGNOSIS — W19XXXA Unspecified fall, initial encounter: Secondary | ICD-10-CM | POA: Diagnosis not present

## 2019-11-19 DIAGNOSIS — R262 Difficulty in walking, not elsewhere classified: Secondary | ICD-10-CM | POA: Diagnosis not present

## 2019-11-19 DIAGNOSIS — R2689 Other abnormalities of gait and mobility: Secondary | ICD-10-CM | POA: Diagnosis not present

## 2019-11-19 DIAGNOSIS — M545 Low back pain: Secondary | ICD-10-CM | POA: Diagnosis not present

## 2019-11-24 DIAGNOSIS — M545 Low back pain: Secondary | ICD-10-CM | POA: Diagnosis not present

## 2019-11-24 DIAGNOSIS — W19XXXA Unspecified fall, initial encounter: Secondary | ICD-10-CM | POA: Diagnosis not present

## 2019-11-24 DIAGNOSIS — R262 Difficulty in walking, not elsewhere classified: Secondary | ICD-10-CM | POA: Diagnosis not present

## 2019-11-24 DIAGNOSIS — G8929 Other chronic pain: Secondary | ICD-10-CM | POA: Diagnosis not present

## 2019-11-24 DIAGNOSIS — R2689 Other abnormalities of gait and mobility: Secondary | ICD-10-CM | POA: Diagnosis not present

## 2019-11-24 DIAGNOSIS — M6281 Muscle weakness (generalized): Secondary | ICD-10-CM | POA: Diagnosis not present

## 2019-11-26 DIAGNOSIS — R262 Difficulty in walking, not elsewhere classified: Secondary | ICD-10-CM | POA: Diagnosis not present

## 2019-11-26 DIAGNOSIS — G8929 Other chronic pain: Secondary | ICD-10-CM | POA: Diagnosis not present

## 2019-11-26 DIAGNOSIS — M545 Low back pain: Secondary | ICD-10-CM | POA: Diagnosis not present

## 2019-11-26 DIAGNOSIS — R2689 Other abnormalities of gait and mobility: Secondary | ICD-10-CM | POA: Diagnosis not present

## 2019-11-26 DIAGNOSIS — M6281 Muscle weakness (generalized): Secondary | ICD-10-CM | POA: Diagnosis not present

## 2019-11-27 IMAGING — CT CT ANGIO CHEST
2 of 6 series · 19 of 46 positions shown · IV contrast (APPLIED)
Comparison: Chest x-ray 09/30/2017

CLINICAL DATA: Shortness of Breath

EXAM:
CT ANGIOGRAPHY CHEST WITH CONTRAST
TECHNIQUE: Multidetector CT imaging of the chest was performed using the
standard protocol during bolus administration of intravenous
contrast. Multiplanar CT image reconstructions and MIPs were
obtained to evaluate the vascular anatomy.
CONTRAST:  75mL 6ECACV-ZPW IOPAMIDOL (6ECACV-ZPW) INJECTION 76%

[Series 5: thins · axial · 0.89mm/px · z∈[-179,+99]mm · 17 of 306 slices shown]
[im 14/306  lung]
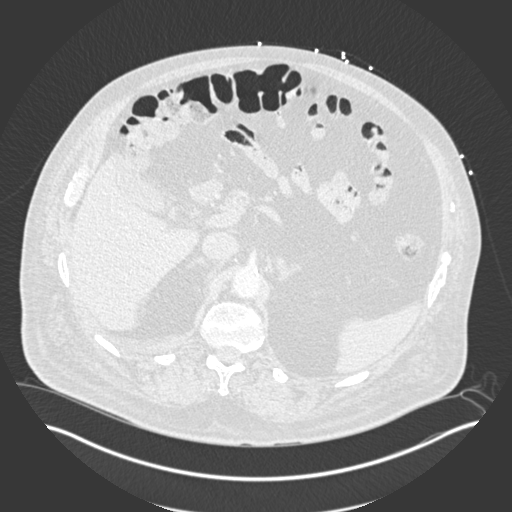
[im 27/306  soft-tissue]
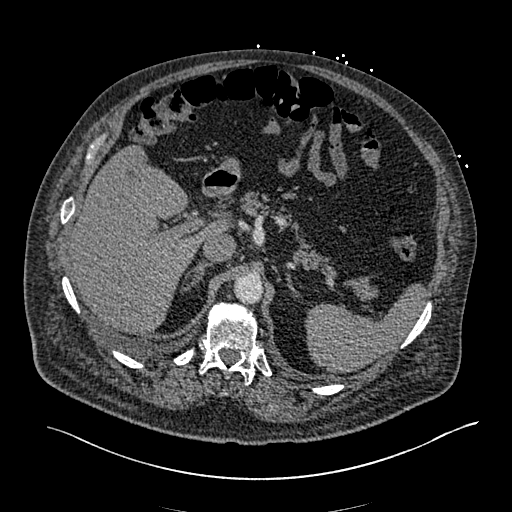
[im 54/306  lung]
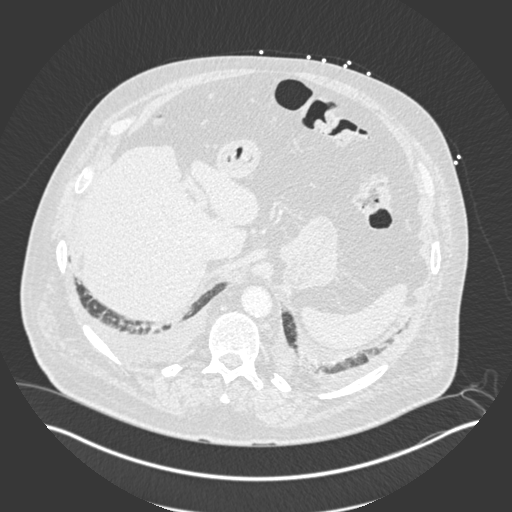
[im 67/306  soft-tissue]
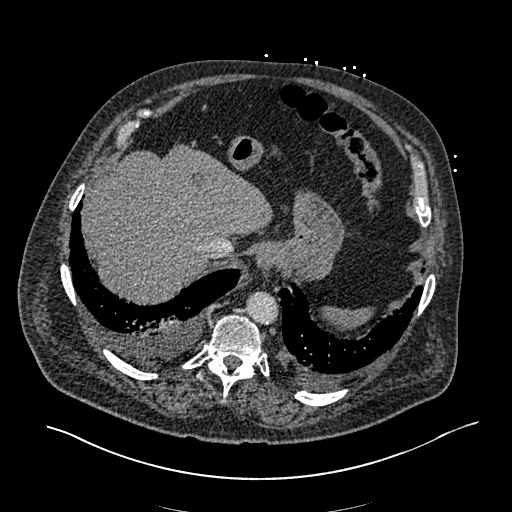
[im 80/306  lung]
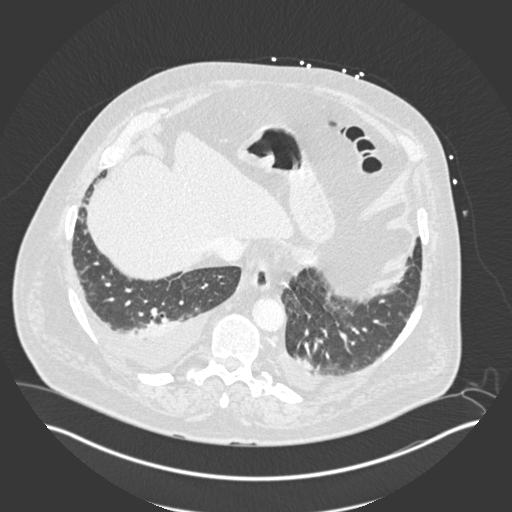
[im 107/306  soft-tissue]
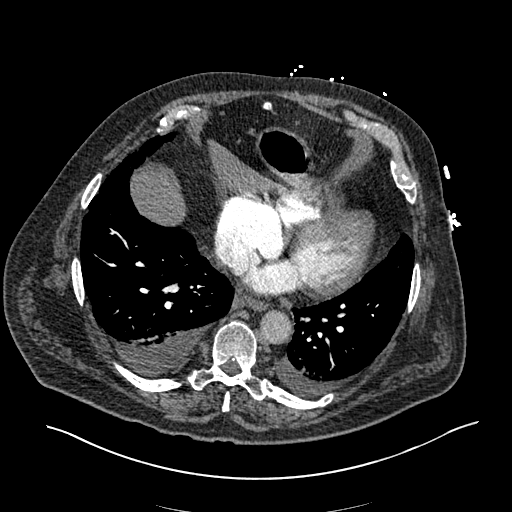
[im 120/306  lung]
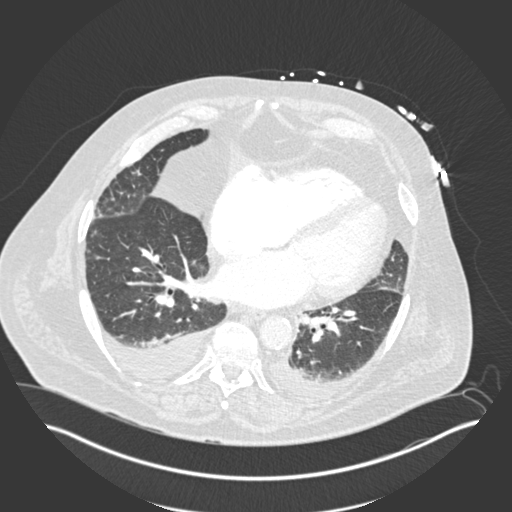
[im 133/306  soft-tissue]
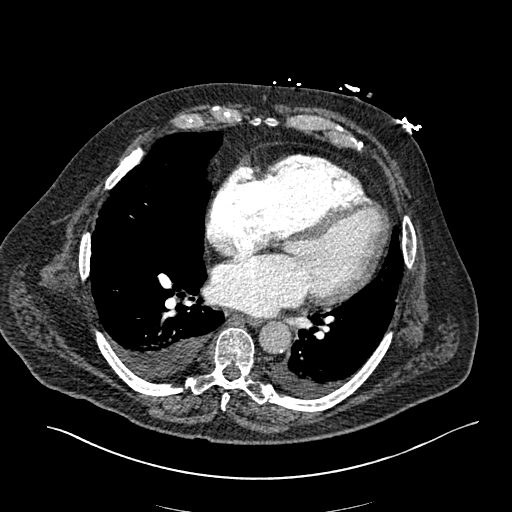
[im 160/306  lung]
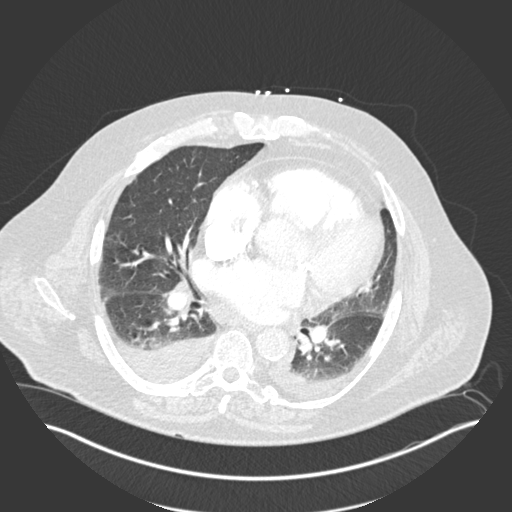
[im 173/306  soft-tissue]
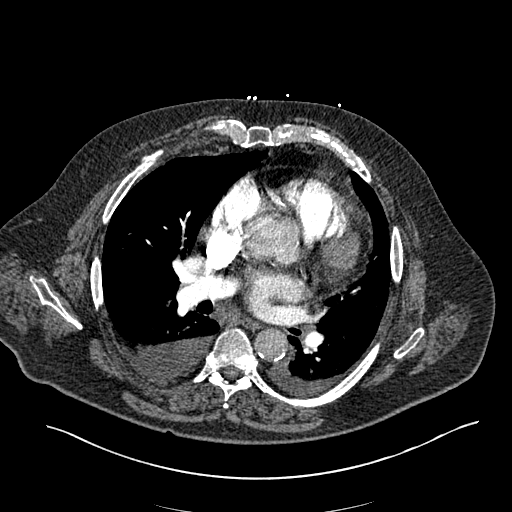
[im 186/306  lung]
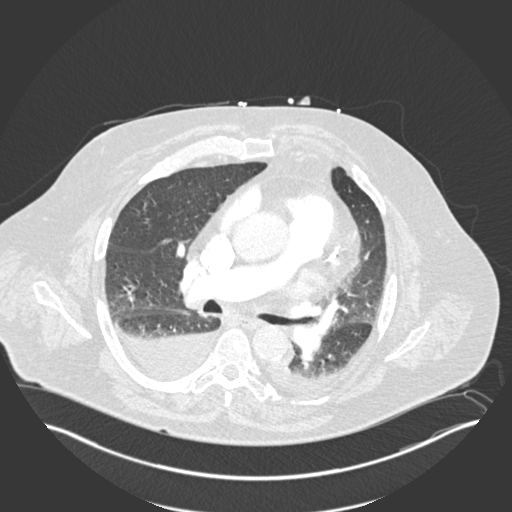
[im 199/306  soft-tissue]
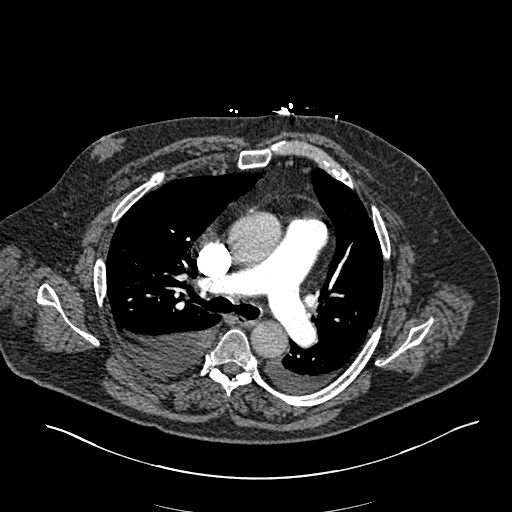
[im 226/306  lung]
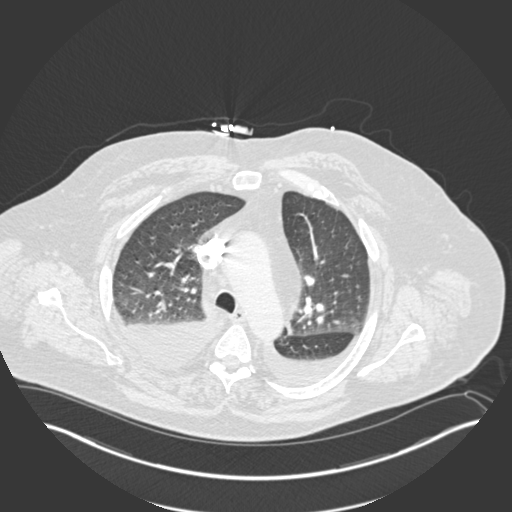
[im 239/306  soft-tissue]
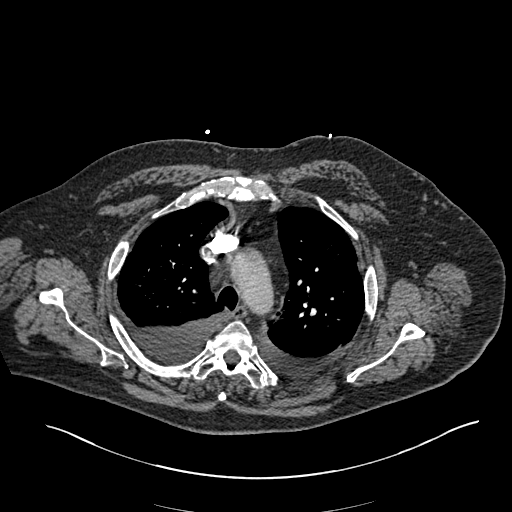
[im 252/306  lung]
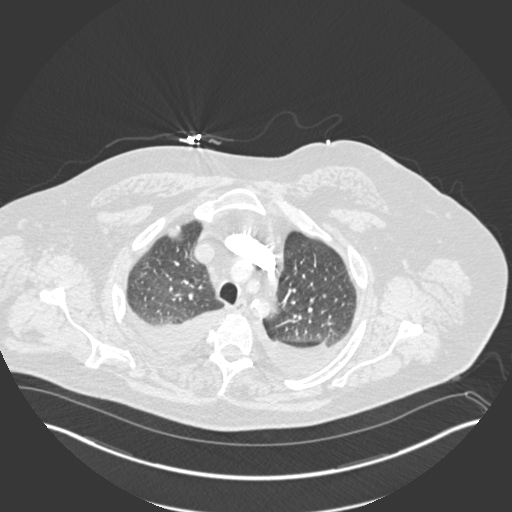
[im 279/306  soft-tissue]
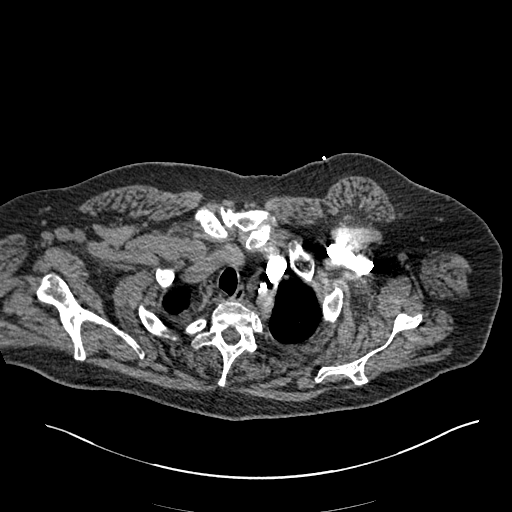
[im 292/306  lung]
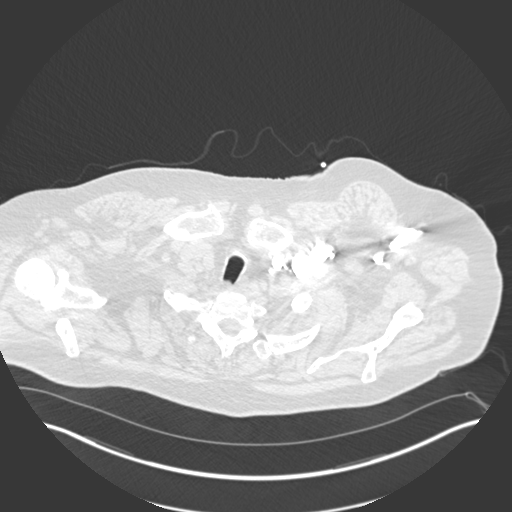

[Series 7: coronal mpr · coronal · 0.60mm/px · 2 of 106 slices shown]
[im 36/106  soft-tissue]
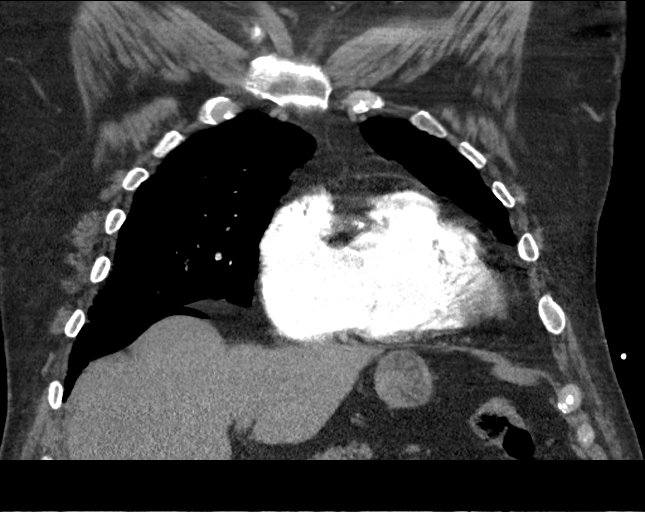
[im 71/106  soft-tissue]
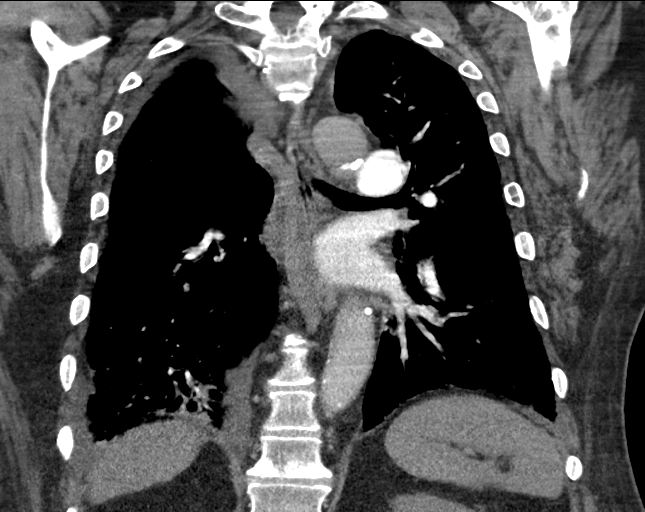

[19 of 46 positions shown; findings below may reference images not displayed]

FINDINGS: Cardiovascular: Cardiomegaly. Coronary artery calcifications and
aortic calcifications. No evidence of aortic aneurysm. No filling
defects in the pulmonary arteries to suggest pulmonary emboli.

Mediastinum/Nodes: No mediastinal, hilar, or axillary adenopathy.

Lungs/Pleura: Dependent lower lobe atelectasis bilaterally. No
confluent opacities. Small bilateral pleural effusions.

Upper Abdomen: Imaging into the upper abdomen shows no acute
findings.

Musculoskeletal: Chest wall soft tissues are unremarkable. No acute
bony abnormality.

Review of the MIP images confirms the above findings.
IMPRESSION: No evidence of pulmonary embolus.

Small bilateral pleural effusions with dependent atelectasis in the
lower lobes.

Cardiomegaly.  Moderate coronary artery disease.

Aortic Atherosclerosis ([id number]).

## 2019-12-16 DIAGNOSIS — M6281 Muscle weakness (generalized): Secondary | ICD-10-CM | POA: Diagnosis not present

## 2019-12-16 DIAGNOSIS — R2689 Other abnormalities of gait and mobility: Secondary | ICD-10-CM | POA: Diagnosis not present

## 2019-12-16 DIAGNOSIS — W19XXXA Unspecified fall, initial encounter: Secondary | ICD-10-CM | POA: Diagnosis not present

## 2019-12-16 DIAGNOSIS — M545 Low back pain: Secondary | ICD-10-CM | POA: Diagnosis not present

## 2019-12-16 DIAGNOSIS — R262 Difficulty in walking, not elsewhere classified: Secondary | ICD-10-CM | POA: Diagnosis not present

## 2019-12-16 DIAGNOSIS — G8929 Other chronic pain: Secondary | ICD-10-CM | POA: Diagnosis not present

## 2019-12-27 DIAGNOSIS — F329 Major depressive disorder, single episode, unspecified: Secondary | ICD-10-CM | POA: Diagnosis not present

## 2019-12-27 DIAGNOSIS — Z6837 Body mass index (BMI) 37.0-37.9, adult: Secondary | ICD-10-CM | POA: Diagnosis not present

## 2019-12-27 DIAGNOSIS — E785 Hyperlipidemia, unspecified: Secondary | ICD-10-CM | POA: Diagnosis not present

## 2019-12-27 DIAGNOSIS — I482 Chronic atrial fibrillation, unspecified: Secondary | ICD-10-CM | POA: Diagnosis not present

## 2019-12-27 DIAGNOSIS — Z79899 Other long term (current) drug therapy: Secondary | ICD-10-CM | POA: Diagnosis not present

## 2019-12-27 DIAGNOSIS — I5032 Chronic diastolic (congestive) heart failure: Secondary | ICD-10-CM | POA: Diagnosis not present

## 2019-12-27 DIAGNOSIS — E669 Obesity, unspecified: Secondary | ICD-10-CM | POA: Diagnosis not present

## 2019-12-27 DIAGNOSIS — E876 Hypokalemia: Secondary | ICD-10-CM | POA: Diagnosis not present

## 2019-12-27 DIAGNOSIS — I110 Hypertensive heart disease with heart failure: Secondary | ICD-10-CM | POA: Diagnosis not present

## 2020-04-10 ENCOUNTER — Other Ambulatory Visit: Payer: Self-pay | Admitting: Family Medicine

## 2020-06-30 DIAGNOSIS — Z6838 Body mass index (BMI) 38.0-38.9, adult: Secondary | ICD-10-CM | POA: Diagnosis not present

## 2020-06-30 DIAGNOSIS — I5032 Chronic diastolic (congestive) heart failure: Secondary | ICD-10-CM | POA: Diagnosis not present

## 2020-06-30 DIAGNOSIS — Z79899 Other long term (current) drug therapy: Secondary | ICD-10-CM | POA: Diagnosis not present

## 2020-06-30 DIAGNOSIS — F324 Major depressive disorder, single episode, in partial remission: Secondary | ICD-10-CM | POA: Diagnosis not present

## 2020-06-30 DIAGNOSIS — I10 Essential (primary) hypertension: Secondary | ICD-10-CM | POA: Diagnosis not present

## 2020-06-30 DIAGNOSIS — I110 Hypertensive heart disease with heart failure: Secondary | ICD-10-CM | POA: Diagnosis not present

## 2020-06-30 DIAGNOSIS — E782 Mixed hyperlipidemia: Secondary | ICD-10-CM | POA: Diagnosis not present

## 2020-06-30 DIAGNOSIS — Z23 Encounter for immunization: Secondary | ICD-10-CM | POA: Diagnosis not present

## 2020-06-30 DIAGNOSIS — E669 Obesity, unspecified: Secondary | ICD-10-CM | POA: Diagnosis not present

## 2021-01-02 DIAGNOSIS — R29818 Other symptoms and signs involving the nervous system: Secondary | ICD-10-CM | POA: Diagnosis not present

## 2021-01-02 DIAGNOSIS — Z0000 Encounter for general adult medical examination without abnormal findings: Secondary | ICD-10-CM | POA: Diagnosis not present

## 2021-01-02 DIAGNOSIS — I110 Hypertensive heart disease with heart failure: Secondary | ICD-10-CM | POA: Diagnosis not present

## 2021-01-02 DIAGNOSIS — I10 Essential (primary) hypertension: Secondary | ICD-10-CM | POA: Diagnosis not present

## 2021-01-02 DIAGNOSIS — Z79899 Other long term (current) drug therapy: Secondary | ICD-10-CM | POA: Diagnosis not present

## 2021-01-02 DIAGNOSIS — D539 Nutritional anemia, unspecified: Secondary | ICD-10-CM | POA: Diagnosis not present

## 2021-01-02 DIAGNOSIS — F325 Major depressive disorder, single episode, in full remission: Secondary | ICD-10-CM | POA: Diagnosis not present

## 2021-01-02 DIAGNOSIS — Z7289 Other problems related to lifestyle: Secondary | ICD-10-CM | POA: Diagnosis not present

## 2021-01-02 DIAGNOSIS — I5032 Chronic diastolic (congestive) heart failure: Secondary | ICD-10-CM | POA: Diagnosis not present

## 2021-01-02 DIAGNOSIS — I482 Chronic atrial fibrillation, unspecified: Secondary | ICD-10-CM | POA: Diagnosis not present

## 2021-01-02 DIAGNOSIS — E669 Obesity, unspecified: Secondary | ICD-10-CM | POA: Diagnosis not present

## 2021-01-02 DIAGNOSIS — E782 Mixed hyperlipidemia: Secondary | ICD-10-CM | POA: Diagnosis not present

## 2021-01-12 DIAGNOSIS — E538 Deficiency of other specified B group vitamins: Secondary | ICD-10-CM | POA: Diagnosis not present

## 2021-01-15 DIAGNOSIS — R202 Paresthesia of skin: Secondary | ICD-10-CM | POA: Diagnosis not present

## 2021-01-15 DIAGNOSIS — Z7689 Persons encountering health services in other specified circumstances: Secondary | ICD-10-CM | POA: Diagnosis not present

## 2021-01-15 DIAGNOSIS — R278 Other lack of coordination: Secondary | ICD-10-CM | POA: Diagnosis not present

## 2021-01-15 DIAGNOSIS — R262 Difficulty in walking, not elsewhere classified: Secondary | ICD-10-CM | POA: Diagnosis not present

## 2021-01-15 DIAGNOSIS — R2689 Other abnormalities of gait and mobility: Secondary | ICD-10-CM | POA: Diagnosis not present

## 2021-01-19 DIAGNOSIS — E538 Deficiency of other specified B group vitamins: Secondary | ICD-10-CM | POA: Diagnosis not present

## 2021-01-26 DIAGNOSIS — E538 Deficiency of other specified B group vitamins: Secondary | ICD-10-CM | POA: Diagnosis not present

## 2021-02-02 DIAGNOSIS — E538 Deficiency of other specified B group vitamins: Secondary | ICD-10-CM | POA: Diagnosis not present

## 2021-03-06 DIAGNOSIS — E538 Deficiency of other specified B group vitamins: Secondary | ICD-10-CM | POA: Diagnosis not present

## 2021-03-29 DIAGNOSIS — M21372 Foot drop, left foot: Secondary | ICD-10-CM | POA: Diagnosis not present

## 2021-03-29 DIAGNOSIS — M21371 Foot drop, right foot: Secondary | ICD-10-CM | POA: Diagnosis not present

## 2021-04-06 DIAGNOSIS — E538 Deficiency of other specified B group vitamins: Secondary | ICD-10-CM | POA: Diagnosis not present

## 2021-05-09 DIAGNOSIS — E538 Deficiency of other specified B group vitamins: Secondary | ICD-10-CM | POA: Diagnosis not present

## 2021-06-08 DIAGNOSIS — E538 Deficiency of other specified B group vitamins: Secondary | ICD-10-CM | POA: Diagnosis not present

## 2021-07-05 DIAGNOSIS — E538 Deficiency of other specified B group vitamins: Secondary | ICD-10-CM | POA: Diagnosis not present

## 2021-07-05 DIAGNOSIS — Z79899 Other long term (current) drug therapy: Secondary | ICD-10-CM | POA: Diagnosis not present

## 2021-07-05 DIAGNOSIS — I5032 Chronic diastolic (congestive) heart failure: Secondary | ICD-10-CM | POA: Diagnosis not present

## 2021-07-05 DIAGNOSIS — E782 Mixed hyperlipidemia: Secondary | ICD-10-CM | POA: Diagnosis not present

## 2021-07-05 DIAGNOSIS — Z23 Encounter for immunization: Secondary | ICD-10-CM | POA: Diagnosis not present

## 2021-07-05 DIAGNOSIS — I482 Chronic atrial fibrillation, unspecified: Secondary | ICD-10-CM | POA: Diagnosis not present

## 2021-07-05 DIAGNOSIS — I110 Hypertensive heart disease with heart failure: Secondary | ICD-10-CM | POA: Diagnosis not present

## 2021-07-09 DIAGNOSIS — E538 Deficiency of other specified B group vitamins: Secondary | ICD-10-CM | POA: Diagnosis not present

## 2021-08-08 DIAGNOSIS — E538 Deficiency of other specified B group vitamins: Secondary | ICD-10-CM | POA: Diagnosis not present

## 2021-09-10 DIAGNOSIS — E538 Deficiency of other specified B group vitamins: Secondary | ICD-10-CM | POA: Diagnosis not present

## 2021-10-11 DIAGNOSIS — E538 Deficiency of other specified B group vitamins: Secondary | ICD-10-CM | POA: Diagnosis not present

## 2021-11-08 DIAGNOSIS — E538 Deficiency of other specified B group vitamins: Secondary | ICD-10-CM | POA: Diagnosis not present

## 2021-12-13 DIAGNOSIS — E538 Deficiency of other specified B group vitamins: Secondary | ICD-10-CM | POA: Diagnosis not present

## 2022-01-10 DIAGNOSIS — I10 Essential (primary) hypertension: Secondary | ICD-10-CM | POA: Diagnosis not present

## 2022-01-10 DIAGNOSIS — Z0000 Encounter for general adult medical examination without abnormal findings: Secondary | ICD-10-CM | POA: Diagnosis not present

## 2022-01-10 DIAGNOSIS — E538 Deficiency of other specified B group vitamins: Secondary | ICD-10-CM | POA: Diagnosis not present

## 2022-01-10 DIAGNOSIS — I110 Hypertensive heart disease with heart failure: Secondary | ICD-10-CM | POA: Diagnosis not present

## 2022-01-10 DIAGNOSIS — Z1389 Encounter for screening for other disorder: Secondary | ICD-10-CM | POA: Diagnosis not present

## 2022-01-10 DIAGNOSIS — I5032 Chronic diastolic (congestive) heart failure: Secondary | ICD-10-CM | POA: Diagnosis not present

## 2022-01-10 DIAGNOSIS — E782 Mixed hyperlipidemia: Secondary | ICD-10-CM | POA: Diagnosis not present

## 2022-01-10 DIAGNOSIS — Z131 Encounter for screening for diabetes mellitus: Secondary | ICD-10-CM | POA: Diagnosis not present

## 2022-01-10 DIAGNOSIS — Z79899 Other long term (current) drug therapy: Secondary | ICD-10-CM | POA: Diagnosis not present

## 2022-01-10 DIAGNOSIS — I482 Chronic atrial fibrillation, unspecified: Secondary | ICD-10-CM | POA: Diagnosis not present

## 2022-01-14 DIAGNOSIS — I10 Essential (primary) hypertension: Secondary | ICD-10-CM | POA: Diagnosis not present

## 2022-02-27 ENCOUNTER — Other Ambulatory Visit: Payer: Self-pay

## 2022-02-27 ENCOUNTER — Inpatient Hospital Stay
Admission: EM | Admit: 2022-02-27 | Discharge: 2022-03-02 | DRG: 378 | Disposition: A | Discharge status: Home / Self Care | Payer: Medicare HMO | Attending: Internal Medicine | Admitting: Internal Medicine

## 2022-02-27 ENCOUNTER — Emergency Department: Payer: Medicare HMO

## 2022-02-27 DIAGNOSIS — K2970 Gastritis, unspecified, without bleeding: Secondary | ICD-10-CM | POA: Diagnosis present

## 2022-02-27 DIAGNOSIS — I110 Hypertensive heart disease with heart failure: Secondary | ICD-10-CM | POA: Diagnosis not present

## 2022-02-27 DIAGNOSIS — F1010 Alcohol abuse, uncomplicated: Secondary | ICD-10-CM | POA: Diagnosis not present

## 2022-02-27 DIAGNOSIS — D62 Acute posthemorrhagic anemia: Secondary | ICD-10-CM

## 2022-02-27 DIAGNOSIS — K254 Chronic or unspecified gastric ulcer with hemorrhage: Principal | ICD-10-CM | POA: Diagnosis present

## 2022-02-27 DIAGNOSIS — I10 Essential (primary) hypertension: Secondary | ICD-10-CM | POA: Diagnosis present

## 2022-02-27 DIAGNOSIS — I5032 Chronic diastolic (congestive) heart failure: Secondary | ICD-10-CM | POA: Diagnosis present

## 2022-02-27 DIAGNOSIS — K449 Diaphragmatic hernia without obstruction or gangrene: Secondary | ICD-10-CM | POA: Diagnosis not present

## 2022-02-27 DIAGNOSIS — N281 Cyst of kidney, acquired: Secondary | ICD-10-CM | POA: Diagnosis not present

## 2022-02-27 DIAGNOSIS — I4891 Unspecified atrial fibrillation: Secondary | ICD-10-CM | POA: Diagnosis not present

## 2022-02-27 DIAGNOSIS — T39395A Adverse effect of other nonsteroidal anti-inflammatory drugs [NSAID], initial encounter: Secondary | ICD-10-CM | POA: Diagnosis present

## 2022-02-27 DIAGNOSIS — R42 Dizziness and giddiness: Secondary | ICD-10-CM | POA: Diagnosis present

## 2022-02-27 DIAGNOSIS — Z8249 Family history of ischemic heart disease and other diseases of the circulatory system: Secondary | ICD-10-CM

## 2022-02-27 DIAGNOSIS — W19XXXA Unspecified fall, initial encounter: Secondary | ICD-10-CM

## 2022-02-27 DIAGNOSIS — Z7901 Long term (current) use of anticoagulants: Secondary | ICD-10-CM

## 2022-02-27 DIAGNOSIS — W010XXA Fall on same level from slipping, tripping and stumbling without subsequent striking against object, initial encounter: Secondary | ICD-10-CM | POA: Diagnosis present

## 2022-02-27 DIAGNOSIS — K921 Melena: Secondary | ICD-10-CM | POA: Diagnosis not present

## 2022-02-27 DIAGNOSIS — D649 Anemia, unspecified: Secondary | ICD-10-CM | POA: Diagnosis not present

## 2022-02-27 DIAGNOSIS — Z6833 Body mass index (BMI) 33.0-33.9, adult: Secondary | ICD-10-CM

## 2022-02-27 DIAGNOSIS — E669 Obesity, unspecified: Secondary | ICD-10-CM | POA: Diagnosis present

## 2022-02-27 DIAGNOSIS — K5730 Diverticulosis of large intestine without perforation or abscess without bleeding: Secondary | ICD-10-CM | POA: Diagnosis not present

## 2022-02-27 DIAGNOSIS — K269 Duodenal ulcer, unspecified as acute or chronic, without hemorrhage or perforation: Secondary | ICD-10-CM | POA: Diagnosis not present

## 2022-02-27 DIAGNOSIS — F324 Major depressive disorder, single episode, in partial remission: Secondary | ICD-10-CM | POA: Diagnosis present

## 2022-02-27 DIAGNOSIS — I482 Chronic atrial fibrillation, unspecified: Secondary | ICD-10-CM | POA: Diagnosis not present

## 2022-02-27 DIAGNOSIS — Z79899 Other long term (current) drug therapy: Secondary | ICD-10-CM

## 2022-02-27 DIAGNOSIS — T1490XA Injury, unspecified, initial encounter: Secondary | ICD-10-CM | POA: Diagnosis not present

## 2022-02-27 DIAGNOSIS — E785 Hyperlipidemia, unspecified: Secondary | ICD-10-CM | POA: Diagnosis present

## 2022-02-27 DIAGNOSIS — N179 Acute kidney failure, unspecified: Secondary | ICD-10-CM | POA: Diagnosis present

## 2022-02-27 DIAGNOSIS — K922 Gastrointestinal hemorrhage, unspecified: Principal | ICD-10-CM

## 2022-02-27 DIAGNOSIS — K259 Gastric ulcer, unspecified as acute or chronic, without hemorrhage or perforation: Secondary | ICD-10-CM | POA: Diagnosis not present

## 2022-02-27 HISTORY — DX: Acute posthemorrhagic anemia: D62

## 2022-02-27 LAB — URINALYSIS, ROUTINE W REFLEX MICROSCOPIC
Bacteria, UA: NONE SEEN
Bilirubin Urine: NEGATIVE
Glucose, UA: NEGATIVE mg/dL
Hgb urine dipstick: NEGATIVE
Ketones, ur: NEGATIVE mg/dL
Leukocytes,Ua: NEGATIVE
Nitrite: NEGATIVE
Protein, ur: NEGATIVE mg/dL
Specific Gravity, Urine: 1.041 — ABNORMAL HIGH (ref 1.005–1.030)
pH: 5 (ref 5.0–8.0)

## 2022-02-27 LAB — CBC
HCT: 27.8 % — ABNORMAL LOW (ref 39.0–52.0)
Hemoglobin: 9 g/dL — ABNORMAL LOW (ref 13.0–17.0)
MCH: 34.2 pg — ABNORMAL HIGH (ref 26.0–34.0)
MCHC: 32.4 g/dL (ref 30.0–36.0)
MCV: 105.7 fL — ABNORMAL HIGH (ref 80.0–100.0)
Platelets: 240 10*3/uL (ref 150–400)
RBC: 2.63 MIL/uL — ABNORMAL LOW (ref 4.22–5.81)
RDW: 15.2 % (ref 11.5–15.5)
WBC: 9.7 10*3/uL (ref 4.0–10.5)
nRBC: 0 % (ref 0.0–0.2)

## 2022-02-27 LAB — IRON AND TIBC
Iron: 60 ug/dL (ref 45–182)
Saturation Ratios: 22 % (ref 17.9–39.5)
TIBC: 267 ug/dL (ref 250–450)
UIBC: 207 ug/dL

## 2022-02-27 LAB — BASIC METABOLIC PANEL
Anion gap: 12 (ref 5–15)
BUN: 37 mg/dL — ABNORMAL HIGH (ref 8–23)
CO2: 25 mmol/L (ref 22–32)
Calcium: 8.4 mg/dL — ABNORMAL LOW (ref 8.9–10.3)
Chloride: 101 mmol/L (ref 98–111)
Creatinine, Ser: 1.34 mg/dL — ABNORMAL HIGH (ref 0.61–1.24)
GFR, Estimated: 53 mL/min — ABNORMAL LOW (ref >60)
Glucose, Bld: 130 mg/dL — ABNORMAL HIGH (ref 70–99)
Potassium: 3.6 mmol/L (ref 3.5–5.1)
Sodium: 138 mmol/L (ref 135–145)

## 2022-02-27 LAB — SAMPLE TO BLOOD BANK

## 2022-02-27 LAB — TROPONIN I (HIGH SENSITIVITY)
Troponin I (High Sensitivity): 4 ng/L (ref <18)
Troponin I (High Sensitivity): 4 ng/L (ref <18)

## 2022-02-27 LAB — PROTIME-INR
INR: 2.6 — ABNORMAL HIGH (ref 0.8–1.2)
Prothrombin Time: 27.6 seconds — ABNORMAL HIGH (ref 11.4–15.2)

## 2022-02-27 LAB — MAGNESIUM: Magnesium: 1.6 mg/dL — ABNORMAL LOW (ref 1.7–2.4)

## 2022-02-27 LAB — FERRITIN: Ferritin: 105 ng/mL (ref 24–336)

## 2022-02-27 MED ORDER — PANTOPRAZOLE SODIUM 40 MG IV SOLR
40.0000 mg | Freq: Two times a day (BID) | INTRAVENOUS | Status: DC
  Administered 2022-02-28 – 2022-03-02 (×5): 40 mg via INTRAVENOUS
  Filled 2022-02-27 (×5): qty 10

## 2022-02-27 MED ORDER — ONDANSETRON HCL 4 MG PO TABS: 4.0000 mg | ORAL_TABLET | Freq: Four times a day (QID) | ORAL | Status: DC | PRN

## 2022-02-27 MED ORDER — SODIUM CHLORIDE 0.9% FLUSH
3.0000 mL | Freq: Two times a day (BID) | INTRAVENOUS | Status: DC
  Administered 2022-02-27 – 2022-03-02 (×5): 3 mL via INTRAVENOUS

## 2022-02-27 MED ORDER — LORAZEPAM 2 MG PO TABS: 0.0000 mg | ORAL_TABLET | Freq: Two times a day (BID) | ORAL | Status: DC

## 2022-02-27 MED ORDER — SODIUM CHLORIDE 0.9% FLUSH: 3.0000 mL | INTRAVENOUS | Status: DC | PRN

## 2022-02-27 MED ORDER — THIAMINE HCL 100 MG/ML IJ SOLN: 100.0000 mg | Freq: Every day | INTRAMUSCULAR | Status: DC

## 2022-02-27 MED ORDER — IOHEXOL 300 MG/ML  SOLN
100.0000 mL | Freq: Once | INTRAMUSCULAR | Status: AC | PRN
  Administered 2022-02-27: 100 mL via INTRAVENOUS

## 2022-02-27 MED ORDER — ACETAMINOPHEN 325 MG PO TABS: 650.0000 mg | ORAL_TABLET | Freq: Four times a day (QID) | ORAL | Status: DC | PRN

## 2022-02-27 MED ORDER — FOLIC ACID 1 MG PO TABS
1.0000 mg | ORAL_TABLET | Freq: Every day | ORAL | Status: DC
  Administered 2022-02-28 – 2022-03-02 (×3): 1 mg via ORAL
  Filled 2022-02-27 (×3): qty 1

## 2022-02-27 MED ORDER — THIAMINE HCL 100 MG PO TABS
100.0000 mg | ORAL_TABLET | Freq: Every day | ORAL | Status: DC
  Administered 2022-02-28 – 2022-03-02 (×3): 100 mg via ORAL
  Filled 2022-02-27 (×3): qty 1

## 2022-02-27 MED ORDER — PAROXETINE HCL 20 MG PO TABS: 20.0000 mg | ORAL_TABLET | Freq: Every day | ORAL | Status: DC

## 2022-02-27 MED ORDER — LORAZEPAM 1 MG PO TABS: 1.0000 mg | ORAL_TABLET | ORAL | Status: DC | PRN

## 2022-02-27 MED ORDER — LORAZEPAM 2 MG/ML IJ SOLN: 1.0000 mg | INTRAMUSCULAR | Status: DC | PRN

## 2022-02-27 MED ORDER — ONDANSETRON HCL 4 MG/2ML IJ SOLN: 4.0000 mg | Freq: Four times a day (QID) | INTRAMUSCULAR | Status: DC | PRN

## 2022-02-27 MED ORDER — LACTATED RINGERS IV BOLUS
1000.0000 mL | Freq: Once | INTRAVENOUS | Status: AC
  Administered 2022-02-27: 1000 mL via INTRAVENOUS

## 2022-02-27 MED ORDER — SODIUM CHLORIDE 0.9% FLUSH
3.0000 mL | Freq: Two times a day (BID) | INTRAVENOUS | Status: DC
  Administered 2022-02-27 – 2022-03-01 (×2): 3 mL via INTRAVENOUS

## 2022-02-27 MED ORDER — SODIUM CHLORIDE 0.9 % IV SOLN: 250.0000 mL | INTRAVENOUS | Status: DC | PRN

## 2022-02-27 MED ORDER — SODIUM CHLORIDE 0.9 % IV SOLN: INTRAVENOUS | Status: DC

## 2022-02-27 MED ORDER — LORAZEPAM 2 MG PO TABS: 0.0000 mg | ORAL_TABLET | Freq: Four times a day (QID) | ORAL | Status: DC

## 2022-02-27 MED ORDER — PANTOPRAZOLE SODIUM 40 MG IV SOLR
40.0000 mg | Freq: Once | INTRAVENOUS | Status: AC
Start: 2022-02-27 — End: 2022-02-27
  Administered 2022-02-27: 40 mg via INTRAVENOUS
  Filled 2022-02-27: qty 10

## 2022-02-27 MED ORDER — ATORVASTATIN CALCIUM 20 MG PO TABS
40.0000 mg | ORAL_TABLET | Freq: Every day | ORAL | Status: DC
  Administered 2022-02-27 – 2022-03-02 (×4): 40 mg via ORAL
  Filled 2022-02-27 (×4): qty 2

## 2022-02-27 MED ORDER — ADULT MULTIVITAMIN W/MINERALS CH
1.0000 | ORAL_TABLET | Freq: Every day | ORAL | Status: DC
  Administered 2022-02-28 – 2022-03-02 (×3): 1 via ORAL
  Filled 2022-02-27 (×3): qty 1

## 2022-02-27 MED ORDER — LIDOCAINE 5 % EX PTCH
1.0000 | MEDICATED_PATCH | CUTANEOUS | Status: DC
Start: 2022-02-27 — End: 2022-03-01
  Administered 2022-02-27 – 2022-02-28 (×2): 1 via TRANSDERMAL
  Filled 2022-02-27 (×2): qty 1

## 2022-02-27 MED ORDER — ACETAMINOPHEN 650 MG RE SUPP: 650.0000 mg | Freq: Four times a day (QID) | RECTAL | Status: DC | PRN

## 2022-02-27 NOTE — Assessment & Plan Note (Signed)
Stable Continue Paxil 

## 2022-02-27 NOTE — ED Provider Notes (Signed)
[facility] Provider Note    Event Date/Time   First MD Initiated Contact with Patient 02/27/22 1659     (approximate)   History   Chief Complaint Dizziness, Shortness of Breath, Back Pain, and GI Bleeding   HPI  Michael Rowe is a 81 y.o. male with past medical history of hypertension, hyperlipidemia, atrial fibrillation on Xarelto, and CHF who presents to the ED complaining of back pain and dizziness.  Patient reports that about 1 month ago he slipped and fell at home, striking the right lower part of his chest wall on the ground.  He denies hitting his head or losing consciousness, has been dealing with intermittent pain over his right lower chest wall since then.  Pain has not seem to get any better or worse, but he became more concerned today when he started to feel dizzy.  He states he has felt intermittently lightheaded for the past 24 hours, denies any associated chest pain or shortness of breath.  He does state he has noticed a couple weeks of dark tarry stools, has not noticed any blood in his stool.  He denies any associated abdominal pain, nausea, vomiting, or dysuria.  He has never had a significant GI bleed in the past.  He deals with chronic atrial fibrillation but denies any palpitations.     Physical Exam   Triage Vital Signs: ED Triage Vitals  Enc Vitals Group     BP 02/27/22 1651 (!) 91/57     Pulse Rate 02/27/22 1651 100     Resp 02/27/22 1651 18     Temp 02/27/22 1651 97.8 F (36.6 C)     Temp Source 02/27/22 1651 Oral     SpO2 02/27/22 1651 97 %     Weight 02/27/22 1652 230 lb (104.3 kg)     Height 02/27/22 1652 5\' 10"  (1.778 m)     Head Circumference --      Peak Flow --      Pain Score 02/27/22 1651 5     Pain Loc --      Pain Edu? --      Excl. in GC? --     Most recent vital signs: Vitals:   02/27/22 1810 02/27/22 1815  BP: (!) 118/54 101/67  Pulse: (!) 102 94  Resp: 16 14  Temp:    SpO2: 100% 100%     Constitutional: Alert and oriented. Eyes: Conjunctivae are normal. Head: Atraumatic.  No midline cervical spine tenderness to palpation. Nose: No congestion/rhinnorhea. Mouth/Throat: Mucous membranes are moist.  Cardiovascular: Normal rate, irregularly irregular rhythm. Grossly normal heart sounds.  2+ radial pulses bilaterally. Respiratory: Normal respiratory effort.  No retractions. Lungs CTAB.  Right posterior lower chest wall tenderness to palpation. Gastrointestinal: Soft and nontender. No distention.  Rectal exam with dark guaiac positive stool. Musculoskeletal: No lower extremity tenderness nor edema.  No midline thoracic or lumbar spinal tenderness to palpation. Neurologic:  Normal speech and language. No gross focal neurologic deficits are appreciated.    ED Results / Procedures / Treatments   Labs (all labs ordered are listed, but only abnormal results are displayed) Labs Reviewed  BASIC METABOLIC PANEL - Abnormal; Notable for the following components:      Result Value   Glucose, Bld 130 (*)    BUN 37 (*)    Creatinine, Ser 1.34 (*)    Calcium 8.4 (*)    GFR, Estimated 53 (*)    All other components within  normal limits  CBC - Abnormal; Notable for the following components:   RBC 2.63 (*)    Hemoglobin 9.0 (*)    HCT 27.8 (*)    MCV 105.7 (*)    MCH 34.2 (*)    All other components within normal limits  PROTIME-INR - Abnormal; Notable for the following components:   Prothrombin Time 27.6 (*)    INR 2.6 (*)    All other components within normal limits  URINALYSIS, ROUTINE W REFLEX MICROSCOPIC  SAMPLE TO BLOOD BANK  TYPE AND SCREEN  TROPONIN I (HIGH SENSITIVITY)  TROPONIN I (HIGH SENSITIVITY)     EKG  ED ECG REPORT I, Chesley Noon, the attending physician, personally viewed and interpreted this ECG.   Date: 02/27/2022  EKG Time: 17:03  Rate: 98  Rhythm: atrial fibrillation, occasional PVC  Axis: Normal  Intervals:none  ST&T Change:  None  RADIOLOGY CT chest/abdomen/pelvis reviewed and interpreted by me with no evidence of rib fracture, hemothorax, or pneumothorax.  PROCEDURES:  Critical Care performed: No  Procedures   MEDICATIONS ORDERED IN ED: Medications  pantoprazole (PROTONIX) injection 40 mg (has no administration in time range)  lactated ringers bolus 1,000 mL (0 mLs Intravenous Stopped 02/27/22 1936)  iohexol (OMNIPAQUE) 300 MG/ML solution 100 mL (100 mLs Intravenous Contrast Given 02/27/22 1824)     IMPRESSION / MDM / ASSESSMENT AND PLAN / ED COURSE  I reviewed the triage vital signs and the nursing notes.                              81 y.o. male with past medical history of hypertension, hyperlipidemia, atrial fibrillation on Xarelto, and CHF who presents to the ED with 1 month of right lateral chest wall pain following a fall at home, has started to become dizzy over the past 24 hours, which she describes as a lightheadedness.  Patient's presentation is most consistent with acute presentation with potential threat to life or bodily function.  Differential diagnosis includes, but is not limited to, rib fracture, hemothorax, pneumothorax, liver laceration, lower GI bleed, upper GI bleed, anemia, dehydration, electrolyte abnormality, AKI.  Patient nontoxic-appearing and in no acute distress, vital signs remarkable for mild tachycardia but otherwise reassuring with stable blood pressure at the time of my assessment.  EKG shows atrial fibrillation with controlled rate, no ischemic changes noted.  Labs remarkable for anemia, down about 3 points from last blood draw in May seen in care everywhere.  Suspect his dizziness is secondary to anemia from lower GI bleed given guaiac positive stool.  We will hold off on transfusion unless hemoglobin continues to trend below 7 or patient becomes hemodynamically unstable.  We will further assess for traumatic injury with CT of his chest and abdomen, no evidence of  traumatic injury to his head or neck.  Additional labs remarkable for mild AKI with no significant electrolyte abnormality, troponin is pending.  CT scan is negative for acute traumatic injury or other acute process.  Troponin is within normal limits and suspect his dizziness is due to new anemia.  We will hold off on transfusion given he remains hemodynamically stable.  Patient given dose of IV Protonix and case discussed with hospitalist for admission.      FINAL CLINICAL IMPRESSION(S) / ED DIAGNOSES   Final diagnoses:  Lower GI bleed  Fall, initial encounter  Dizziness     Rx / DC Orders   ED Discharge Orders  None        Note:  This document was prepared using Dragon voice recognition software and may include unintentional dictation errors.   Chesley Noon, MD 02/27/22 2037

## 2022-02-27 NOTE — ED Triage Notes (Signed)
Pt states that he had a fall June 1st, pt reports hitting the floor with his right side and has cont to have right sided back pain that is worse, pt reports 2 weeks of dark tarry stools and states that he feels dizzy with standing and today it didn't resolve and he had to hold the wall for stabilization as he walked, pt reports some sob that he doesn't usually have, pt is on xarelto

## 2022-02-27 NOTE — Assessment & Plan Note (Addendum)
Patient reported typically consumes 3-4 drinks daily usually liquor.  Started on CIWA protocol on admission. 6/30 no signs of withdrawal symptoms --Stop CIWA --Started on multivitamin, thiamine and folic acid --TOC consulted to provide resources for alcohol cessation.

## 2022-02-27 NOTE — Assessment & Plan Note (Addendum)
Metoprolol and Cardizem were held due to soft BPs, resume when able

## 2022-02-27 NOTE — ED Notes (Signed)
Patient in bed, labs collected. Patient was advised to use the urinal and not to get up without calling for assistance first. I advise that the nursing staff would help him ambulate. Patient is very dizzy when ambulating so I advised, it would be safer to stay in bed.

## 2022-02-27 NOTE — ED Notes (Signed)
Pt reports dizziness and black tarry stools  pt denies abd pain   no chest pain or sob.  Sx for several days after falling.  Pt alert  speech clear  family with pt.  Pt pale.  Iv in place.  Pt is on blood thinners for afib.

## 2022-02-27 NOTE — Assessment & Plan Note (Signed)
Stable and not acutely exacerbated Hold furosemide and metoprolol due to relative hypotension

## 2022-02-27 NOTE — H&P (Addendum)
History and Physical    Patient: Michael Rowe JGG:[id number] DOB: 1941-02-25 DOA: 02/27/2022 DOS: the patient was seen and examined on 02/27/2022 PCP: Myrene Buddy, NP  Patient coming from: Home  Chief Complaint:  Chief Complaint  Patient presents with   Dizziness   Shortness of Breath   Back Pain   GI Bleeding   HPI: Michael Rowe is a 81 y.o. male with medical history significant for atrial fibrillation on chronic anticoagulation therapy, obesity, hypertension, dyslipidemia, chronic diastolic dysfunction CHF who presents to the ER for evaluation of back pain and dizziness. Patient states that he fell several weeks ago on June 1 landing on his right side and since then has had low back pain that is usually worse with any form of movement.  He rates his pain a 7 x 10 in intensity at its worst and according to him has not taken anything at home for pain control.  He denies any NSAID use. He also complains of passage of dark tarry stools for over a week and developed dizziness several days ago.  He states that he usually gets dizzy while standing and has to hold onto walls for stabilization.  He has not had any further falls since June 1. He denies having any chest pain, no shortness of breath, no headache, no fever, no chills, no urinary symptoms, no abdominal pain, no changes in his bowel habits, no headache, no blurred vision no focal deficits. Patient is on Xarelto as primary prophylaxis for an acute stroke due to his history of A-fib and is noted to have a drop in his hemoglobin from 12.6 g/dl to 9.0 g/dl. He will be admitted to the hospital for further evaluation     Review of Systems: As mentioned in the history of present illness. All other systems reviewed and are negative. Past Medical History:  Diagnosis Date   AKI (acute kidney injury) (HCC) 09/30/2017   Alcohol abuse    Atrial fibrillation (HCC)    a. patient reports being diagnosed with A fib ~ 15-20  years prior in [place], [place]; b. noted to be in Afib 1/19; c. CHADS2VASc => 4 (HTN, age x 2, vascular dosease)   CHF (congestive heart failure) (HCC)    HLD (hyperlipidemia)    Hypertension    Macrocytic anemia    Morbid obesity (HCC)    Past Surgical History:  Procedure Laterality Date   CARDIOVERSION N/A 11/14/2017   Procedure: CARDIOVERSION;  Surgeon: Antonieta Iba, MD;  Location: [facility] ORS;  Service: Cardiovascular;  Laterality: N/A;   KNEE SURGERY     Social History:  reports that he has never smoked. He has never used smokeless tobacco. He reports current alcohol use of about 4.0 - 5.0 standard drinks of alcohol per week. He reports that he does not use drugs.  No Known Allergies  Family History  Problem Relation Age of Onset   Liver disease Mother    Heart disease Father    CAD Brother    Cancer Brother    Cancer Brother    Cancer Brother     Prior to Admission medications   Medication Sig Start Date End Date Taking? Authorizing Provider  atorvastatin (LIPITOR) 40 MG tablet Take 1 tablet (40 mg total) by mouth daily. Please call for an appointment for more refills 05/19/19   Olevia Perches P, DO  diltiazem (CARDIZEM CD) 120 MG 24 hr capsule TAKE 1 CAPSULE (120 MG TOTAL) DAILY. (NEED MD APPOINTMENT FOR REFILLS) 07/19/19  Antonieta Iba, MD  furosemide (LASIX) 20 MG tablet Take 1 tablet (20 mg total) by mouth daily. 11/25/18   Antonieta Iba, MD  metoprolol tartrate (LOPRESSOR) 50 MG tablet TAKE 1 TABLET TWICE DAILY 07/24/19   Johnson, Megan P, DO  PARoxetine (PAXIL) 20 MG tablet Take 1 tablet (20 mg total) by mouth daily. 11/06/18   Johnson, Megan P, DO  rivaroxaban (XARELTO) 20 MG TABS tablet Take 1 tablet (20 mg total) by mouth daily with supper. 11/11/18   Antonieta Iba, MD    Physical Exam: Vitals:   02/27/22 1815 02/27/22 2056 02/27/22 2058 02/27/22 2145  BP: 101/67 (!) 99/56  (!) 98/58  Pulse: 94 (!) 102  (!) 112  Resp: 14 (!) 30  (!) 29  Temp:   98.2 F  (36.8 C)   TempSrc:   Oral   SpO2: 100% 100%  100%  Weight:      Height:       Physical Exam Vitals and nursing note reviewed.  Constitutional:      Appearance: He is obese.  HENT:     Head: Normocephalic and atraumatic.     Mouth/Throat:     Mouth: Mucous membranes are moist.  Eyes:     Comments: Pale conjunctiva  Cardiovascular:     Rate and Rhythm: Tachycardia present.  Pulmonary:     Effort: Pulmonary effort is normal.     Breath sounds: Normal breath sounds.  Abdominal:     General: Bowel sounds are normal.     Palpations: Abdomen is soft.  Genitourinary:    Rectum: Guaiac result positive.  Musculoskeletal:        General: Normal range of motion.     Cervical back: Normal range of motion and neck supple.  Skin:    General: Skin is warm and dry.  Neurological:     General: No focal deficit present.     Mental Status: He is alert.  Psychiatric:        Mood and Affect: Mood normal.        Behavior: Behavior normal.     Data Reviewed: Relevant notes from primary care and specialist visits, past discharge summaries as available in EHR, including Care Everywhere. Prior diagnostic testing as pertinent to current admission diagnoses Updated medications and problem lists for reconciliation ED course, including vitals, labs, imaging, treatment and response to treatment Triage notes, nursing and pharmacy notes and ED provider's notes Notable results as noted in HPI Labs reviewed.  Troponin 4, PT 27.6, INR 2.6, sodium 138, potassium 3.6, chloride 101, bicarb 25, glucose 130, BUN 37, creatinine 1.34, calcium 8.4, white count 9.7, hemoglobin 9.0 compared to baseline of 12.6 from 05/23, hematocrit 27.8, MCV 105.7, RDW 15.2, platelet count 240 CT scan of chest/abdomen/pelvis showed no acute findings or evidence of significant traumatic injury in the chest, abdomen or pelvis. Coronary artery disease, aortic atherosclerosis.Sigmoid diverticulosis. Twelve-lead EKG reviewed by me  shows rapid A-fib with low voltage QRS. There are no new results to review at this time.  Assessment and Plan: * ABLA (acute blood loss anemia) Patient presents for evaluation of passage of melena stools. He is symptomatic as evidenced by dizziness most likely related to hypotension.  Patient has had a drop in his hemoglobin from 12.6 g/dl in May, 7846 to 9.0 on admission. Hold Xarelto for now Hold Cardizem and metoprolol Place patient on Protonix 40 mg IV every 12 Obtain iron studies Check serial H&H and transfuse as needed Consult  gastroenterology  HTN (hypertension) Hold metoprolol and Cardizem due to relative hypotension  Depression, major, single episode, in partial remission (HCC) Stable Continue Paxil  Chronic diastolic heart failure (HCC) Stable and not acutely exacerbated Hold furosemide and metoprolol due to relative hypotension  Alcohol abuse Patient admits to 3-4 drinks daily usually liquor We will place on CIWA protocol and administer lorazepam for CIWA score of 8 or greater Place patient on MVI/thiamine/folic acid Patient has been counseled on the need to abstain from further alcohol use.  Atrial fibrillation with RVR (HCC) Patient noted to be in A-fib with a rapid ventricular rate most likely related to volume depletion from GI bleed Hold metoprolol and Cardizem due to relative hypotension Hold Xarelto       Advance Care Planning:   Code Status: Full Code   Consults: Gastroenterology  Family Communication: Greater than 50% of time was spent discussing patient's condition and plan of care with him at the bedside.  All questions and concerns have been addressed.  He verbalizes understanding and agrees with the plan.  CODE STATUS was discussed and he is a full code  Severity of Illness: The appropriate patient status for this patient is INPATIENT. Inpatient status is judged to be reasonable and necessary in order to provide the required intensity of service  to ensure the patient's safety. The patient's presenting symptoms, physical exam findings, and initial radiographic and laboratory data in the context of their chronic comorbidities is felt to place them at high risk for further clinical deterioration. Furthermore, it is not anticipated that the patient will be medically stable for discharge from the hospital within 2 midnights of admission.   * I certify that at the point of admission it is my clinical judgment that the patient will require inpatient hospital care spanning beyond 2 midnights from the point of admission due to high intensity of service, high risk for further deterioration and high frequency of surveillance required.*  Author: Lucile Shutters, MD 02/27/2022 10:29 PM  For on call review [email or web address].

## 2022-02-27 NOTE — ED Notes (Signed)
Patient found standing in doorway of room screaming for someone to come in room. Patient bleeding from IV site and IV found to be removed by patient at this time. Patient assisted back to bed, bandaged placed, and reoriented. Patient reassured of plan of care and call bell placed within reach. Patient instructed to use call bell for any further needs.

## 2022-02-27 NOTE — Assessment & Plan Note (Signed)
Presented in A-fib with a rapid ventricular rate most likely related to volume depletion from GI bleed. --Hold metoprolol and Cardizem due to relative hypotension --Hold Xarelto --Monitor on telemetry

## 2022-02-27 NOTE — Assessment & Plan Note (Addendum)
Presented for evaluation melena and anemic symptoms of generalized weakness, dizziness and dyspnea on exertion.  Noted to have hemoglobin in May 12.6, down to 9.0 on admission and further dropped to 6.9.  Transfused 2 units PRBCs with improvement to 8.0.  EGD on 6/29 showed a nonbleeding gastric ulcer, gastritis and duodenal erosions.  6/30: Hemoglobin down again this morning 6.6, 6.9 --1 unit PRBCs ordered for transfusion --Check posttransfusion H&H -- GI consulted EGD done, no colonoscopy planned at this time -- Hold Xarelto -- Hold Cardizem and metoprolol with soft BP -- Protonix 40 mg IV every 12 -- Iron studies were normal, B12 normal --Monitor H&H and transfuse as needed

## 2022-02-28 ENCOUNTER — Encounter
Admission: EM | Disposition: A | Discharge status: Home / Self Care | Payer: Self-pay | Source: Home / Self Care | Attending: Internal Medicine

## 2022-02-28 ENCOUNTER — Inpatient Hospital Stay: Payer: Medicare HMO | Admitting: Anesthesiology

## 2022-02-28 ENCOUNTER — Encounter: Payer: Self-pay | Admitting: Internal Medicine

## 2022-02-28 DIAGNOSIS — K269 Duodenal ulcer, unspecified as acute or chronic, without hemorrhage or perforation: Secondary | ICD-10-CM | POA: Diagnosis not present

## 2022-02-28 DIAGNOSIS — K449 Diaphragmatic hernia without obstruction or gangrene: Secondary | ICD-10-CM | POA: Diagnosis not present

## 2022-02-28 DIAGNOSIS — E669 Obesity, unspecified: Secondary | ICD-10-CM | POA: Diagnosis present

## 2022-02-28 DIAGNOSIS — K2970 Gastritis, unspecified, without bleeding: Secondary | ICD-10-CM | POA: Diagnosis not present

## 2022-02-28 DIAGNOSIS — D62 Acute posthemorrhagic anemia: Secondary | ICD-10-CM | POA: Diagnosis not present

## 2022-02-28 DIAGNOSIS — K259 Gastric ulcer, unspecified as acute or chronic, without hemorrhage or perforation: Secondary | ICD-10-CM | POA: Diagnosis not present

## 2022-02-28 DIAGNOSIS — K921 Melena: Secondary | ICD-10-CM | POA: Diagnosis not present

## 2022-02-28 HISTORY — PX: ESOPHAGOGASTRODUODENOSCOPY (EGD) WITH PROPOFOL: SHX5813

## 2022-02-28 HISTORY — DX: Hypomagnesemia: E83.42

## 2022-02-28 LAB — BASIC METABOLIC PANEL
Anion gap: 6 (ref 5–15)
BUN: 52 mg/dL — ABNORMAL HIGH (ref 8–23)
CO2: 24 mmol/L (ref 22–32)
Calcium: 7.4 mg/dL — ABNORMAL LOW (ref 8.9–10.3)
Chloride: 107 mmol/L (ref 98–111)
Creatinine, Ser: 1.19 mg/dL (ref 0.61–1.24)
GFR, Estimated: >60 mL/min (ref >60)
Glucose, Bld: 138 mg/dL — ABNORMAL HIGH (ref 70–99)
Potassium: 4.1 mmol/L (ref 3.5–5.1)
Sodium: 137 mmol/L (ref 135–145)

## 2022-02-28 LAB — PREPARE RBC (CROSSMATCH)

## 2022-02-28 LAB — MAGNESIUM: Magnesium: 1.6 mg/dL — ABNORMAL LOW (ref 1.7–2.4)

## 2022-02-28 LAB — HEMOGLOBIN AND HEMATOCRIT, BLOOD
HCT: 21 % — ABNORMAL LOW (ref 39.0–52.0)
HCT: 23.8 % — ABNORMAL LOW (ref 39.0–52.0)
HCT: 24 % — ABNORMAL LOW (ref 39.0–52.0)
Hemoglobin: 6.9 g/dL — ABNORMAL LOW (ref 13.0–17.0)
Hemoglobin: 7.8 g/dL — ABNORMAL LOW (ref 13.0–17.0)
Hemoglobin: 8 g/dL — ABNORMAL LOW (ref 13.0–17.0)

## 2022-02-28 LAB — VITAMIN B12: Vitamin B-12: 434 pg/mL (ref 180–914)

## 2022-02-28 LAB — ABO/RH: ABO/RH(D): A POS

## 2022-02-28 SURGERY — ESOPHAGOGASTRODUODENOSCOPY (EGD) WITH PROPOFOL
Anesthesia: General

## 2022-02-28 MED ORDER — LIDOCAINE HCL (CARDIAC) PF 100 MG/5ML IV SOSY
PREFILLED_SYRINGE | INTRAVENOUS | Status: DC | PRN
  Administered 2022-02-28: 50 mg via INTRAVENOUS

## 2022-02-28 MED ORDER — MAGNESIUM SULFATE 2 GM/50ML IV SOLN
2.0000 g | Freq: Once | INTRAVENOUS | Status: AC
  Administered 2022-02-28: 2 g via INTRAVENOUS
  Filled 2022-02-28: qty 50

## 2022-02-28 MED ORDER — SODIUM CHLORIDE 0.9% IV SOLUTION: Freq: Once | INTRAVENOUS | Status: DC

## 2022-02-28 MED ORDER — PROPOFOL 500 MG/50ML IV EMUL
INTRAVENOUS | Status: DC | PRN
  Administered 2022-02-28: 80 ug/kg/min via INTRAVENOUS

## 2022-02-28 MED ORDER — PROPOFOL 10 MG/ML IV BOLUS
INTRAVENOUS | Status: DC | PRN
  Administered 2022-02-28: 40 mg via INTRAVENOUS
  Administered 2022-02-28: 20 mg via INTRAVENOUS

## 2022-02-28 MED ORDER — SODIUM CHLORIDE 0.9% IV SOLUTION
Freq: Once | INTRAVENOUS | Status: AC
  Filled 2022-02-28: qty 250

## 2022-02-28 MED ORDER — PHENYLEPHRINE HCL (PRESSORS) 10 MG/ML IV SOLN
INTRAVENOUS | Status: DC | PRN
  Administered 2022-02-28: 80 ug via INTRAVENOUS

## 2022-02-28 MED ORDER — METOPROLOL TARTRATE 5 MG/5ML IV SOLN
INTRAVENOUS | Status: AC
  Filled 2022-02-28: qty 5

## 2022-02-28 MED ORDER — SODIUM CHLORIDE 0.9 % IV SOLN
Freq: Once | INTRAVENOUS | Status: AC
Start: 2022-02-28 — End: 2022-02-28

## 2022-02-28 MED ORDER — METOPROLOL TARTRATE 5 MG/5ML IV SOLN
INTRAVENOUS | Status: DC | PRN
  Administered 2022-02-28: 2.5 mg via INTRAVENOUS

## 2022-02-28 NOTE — ED Notes (Signed)
Pt found with tick on abd- tick removed with head intact

## 2022-02-28 NOTE — ED Notes (Signed)
2nd unit of blood finished, VSS. Pt denies any needs at this time. Pt does state he is less SOB than he was. Call bell in reach.

## 2022-02-28 NOTE — Anesthesia Postprocedure Evaluation (Signed)
Anesthesia Post Note  Patient: Michael Rowe  Procedure(s) Performed: ESOPHAGOGASTRODUODENOSCOPY (EGD) WITH PROPOFOL  Patient location during evaluation: Endoscopy Anesthesia Type: General Level of consciousness: awake and alert Pain management: pain level controlled Vital Signs Assessment: post-procedure vital signs reviewed and stable Respiratory status: spontaneous breathing, nonlabored ventilation, respiratory function stable and patient connected to nasal cannula oxygen Cardiovascular status: blood pressure returned to baseline and stable Postop Assessment: no apparent nausea or vomiting Anesthetic complications: no   No notable events documented.   Last Vitals:  Vitals:   02/28/22 1404 02/28/22 1414  BP: 113/77 110/62  Pulse: (!) 106 99  Resp: 16 15  Temp:    SpO2: 99% 91%    Last Pain:  Vitals:   02/28/22 1404  TempSrc:   PainSc: 0-No pain                 [name] [name]

## 2022-02-28 NOTE — Progress Notes (Signed)
Progress Note   Patient: Michael Rowe TGG:[id number] DOB: [birth year] DOA: 02/27/2022     1 DOS: the patient was seen and examined on 02/28/2022   Brief hospital course: Michael Rowe is a 81 y.o. male with medical history significant for atrial fibrillation on chronic anticoagulation therapy, obesity, hypertension, dyslipidemia, chronic diastolic dysfunction CHF who presented on 02/27/2022 for evaluation of back pain since a fall several weeks ago on June 1 and dizziness with shortness of breath on exertion and generalized weakness.  He also reports noticing very dark tarry stools for over a week.  Patient takes Xarelto for his A-fib.  Evaluation in the ED revealed a drop in hemoglobin from 12.6 down to 9.0, repeat further declined to 6.9. 2 units PRBCs were ordered for transfusion and patient was admitted to the hospital for further evaluation and management.  Gastroenterology consulted.   Assessment and Plan: * ABLA (acute blood loss anemia) Presented for evaluation melena and anemic symptoms of generalized weakness, dizziness and dyspnea on exertion.  Noted to have hemoglobin in May 2012 0.6, down to 9.0 on admission and further dropped to 6.9. -- GI consulted -- Hold Xarelto -- Hold Cardizem and metoprolol with soft BP -- Protonix 40 mg IV every 12 --Follow-up pending iron studies --Monitor H&H and transfuse as needed  HTN (hypertension) Metoprolol and Cardizem were held due to soft BPs, resume when able  Depression, major, single episode, in partial remission (HCC) Chronic, stable.  Continue Paxil  Hypomagnesemia Mg 1.6.  Replace with 2 g IV mag sulfate.  Monitor and replace further as needed.  Obesity (BMI 30-39.9) Body mass index is 33 kg/m. Complicates overall care and prognosis.  Recommend lifestyle modifications including physical activity and diet for weight loss and overall long-term health.   Chronic diastolic heart failure (HCC) Stable and not acutely  decompensated. --Hold furosemide and metoprolol with soft BPs, resume when able  Alcohol abuse Patient reported typically consumes 3-4 drinks daily usually liquor.  Started on CIWA protocol on admission. --Continue CIWA for now DC if no signs of withdrawal --Started on multivitamin, thiamine and folic acid --TOC consulted to provide resources for alcohol cessation.  Atrial fibrillation with RVR (HCC) Presented in A-fib with a rapid ventricular rate most likely related to volume depletion from GI bleed. --Hold metoprolol and Cardizem due to relative hypotension --Hold Xarelto --Monitor on telemetry         Subjective: Patient is seen in the ED holding for bed today.  He reports ongoing back pain for about a month since his fall.  States he has not had any bowel movement since admission here.  Denies nausea vomiting but does report having occasional twinges of abdominal pain recently.  He has been extremely dizzy and almost unable to sit and melena started.  No other acute complaints at this time  Physical Exam: Vitals:   02/28/22 1532 02/28/22 1559 02/28/22 1631 02/28/22 1656  BP: 120/78 104/81 126/79 (!) 144/75  Pulse: 89 98 (!) 106 (!) 112  Resp: 17 17 14 18   Temp: (!) 97.4 F (36.3 C)   98 F (36.7 C)  TempSrc: Temporal     SpO2: 100% 100% 100% 100%  Weight:      Height:       General exam: awake, alert, no acute distress, obese HEENT: atraumatic, pale conjunctiva, anicteric sclera, moist mucus membranes, hearing grossly normal  Respiratory system: CTAB but diminished in bilateral, no wheezes, rales or rhonchi, normal respiratory effort. Cardiovascular system: normal  S1/S2, RRR, unable to visualize JVD, no pedal edema.   Gastrointestinal system: Protuberant abdomen, NT, ND, +bowel sounds. Central nervous system: A&O x3. no gross focal neurologic deficits, normal speech Extremities: moves all, no tremors, normal tone Skin: dry, intact, normal temperature, pale  appearing Psychiatry: normal mood, congruent affect, judgement and insight appear normal   Data Reviewed:  Notable labs: Hemoglobin improved from 6.9-7.8 after blood transfusions, repeat later today 8.0.  BMP with glucose 138 BUN 52, calcium 7.4, magnesium 1.6  Family Communication: None  Disposition: Status is: Inpatient Remains inpatient appropriate because: Ongoing evaluation not appropriate for the outpatient setting.  Has required blood transfusions within the past 24 hours for GI bleeding.   Planned Discharge Destination: Home    Time spent: 40 minutes  Author: Pennie Banter, DO 02/28/2022 6:36 PM  For on call review [email or web address].

## 2022-02-28 NOTE — Op Note (Addendum)
[facility]astroenterology Patient Name: Michael Rowe Procedure Date: 02/28/2022 1:14 PM MRN: [id number] Account #: [id number] Date of Birth: March 05, 1941 Admit Type: Inpatient Age: 81 Room: [facility] ENDO ROOM 1 Gender: Male Note Status: Supervisor Override Instrument Name: Altamese Cabal Endoscope [id number] Procedure:             Upper GI endoscopy Indications:           Melena Providers:             Andrey Farmer MD, MD Referring MD:          Juluis Rainier (Referring MD) Medicines:             Monitored Anesthesia Care Complications:         No immediate complications. Procedure:             Pre-Anesthesia Assessment:                        - Prior to the procedure, a History and Physical was                         performed, and patient medications and allergies were                         reviewed. The patient is competent. The risks and                         benefits of the procedure and the sedation options and                         risks were discussed with the patient. All questions                         were answered and informed consent was obtained.                         Patient identification and proposed procedure were                         verified by the physician, the nurse, the                         anesthesiologist, the anesthetist and the technician                         in the endoscopy suite. Mental Status Examination:                         alert and oriented. Airway Examination: normal                         oropharyngeal airway and neck mobility. Respiratory                         Examination: clear to auscultation. CV Examination:                         normal. Prophylactic Antibiotics: The patient does not  require prophylactic antibiotics. Prior                         Anticoagulants: The patient has taken Xarelto                         (rivaroxaban), last dose was 2 days prior to                          procedure. ASA Grade Assessment: E - Emergency. After                         reviewing the risks and benefits, the patient was                         deemed in satisfactory condition to undergo the                         procedure. The anesthesia plan was to use monitored                         anesthesia care (MAC). Immediately prior to                         administration of medications, the patient was                         re-assessed for adequacy to receive sedatives. The                         heart rate, respiratory rate, oxygen saturations,                         blood pressure, adequacy of pulmonary ventilation, and                         response to care were monitored throughout the                         procedure. The physical status of the patient was                         re-assessed after the procedure.                        After obtaining informed consent, the endoscope was                         passed under direct vision. Throughout the procedure,                         the patient's blood pressure, pulse, and oxygen                         saturations were monitored continuously. The Endoscope                         was introduced through the mouth, and advanced to the  second part of duodenum. The upper GI endoscopy was                         accomplished without difficulty. The patient tolerated                         the procedure well. Findings:      A small hiatal hernia was present.      The exam of the esophagus was otherwise normal.      One non-bleeding cratered gastric ulcer with a clean ulcer base (Forrest       Class III) was found in the gastric antrum. The lesion was 7 mm in       largest dimension.      Patchy moderate inflammation characterized by erosions was found in the       gastric antrum.      A few diffuse erosions without bleeding were found in the duodenal bulb,       in the first portion of the  duodenum and in the second portion of the       duodenum. Impression:            - Small hiatal hernia.                        - Non-bleeding gastric ulcer with a clean ulcer base                         (Forrest Class III).                        - Gastritis.                        - Duodenal erosions without bleeding.                        - No specimens collected. Recommendation:        - Return patient to hospital ward for ongoing care.                        - Advance diet as tolerated.                        - Use Protonix (pantoprazole) 40 mg PO BID for 3                         months.                        - Resume Xarelto (rivaroxaban) at prior dose in 1 week.                        - Repeat upper endoscopy in 3 months to check healing.                        - Return to GI clinic at appointment to be scheduled. Procedure Code(s):     --- Professional ---                        [phone], Esophagogastroduodenoscopy, flexible,  transoral; diagnostic, including collection of                         specimen(s) by brushing or washing, when performed                         (separate procedure) Diagnosis Code(s):     --- Professional ---                        K44.9, Diaphragmatic hernia without obstruction or                         gangrene                        K25.9, Gastric ulcer, unspecified as acute or chronic,                         without hemorrhage or perforation                        K29.70, Gastritis, unspecified, without bleeding                        K26.9, Duodenal ulcer, unspecified as acute or                         chronic, without hemorrhage or perforation                        K92.1, Melena (includes Hematochezia) CPT copyright 2019 American Medical Association. All rights reserved. The codes documented in this report are preliminary and upon coder review may  be revised to meet current compliance requirements. Andrey Farmer MD,  MD 02/28/2022 1:45:15 PM Number of Addenda: 0 Note Initiated On: 02/28/2022 1:14 PM Estimated Blood Loss:  Estimated blood loss: none.      [facility]

## 2022-02-28 NOTE — Anesthesia Preprocedure Evaluation (Signed)
Anesthesia Evaluation  Patient identified by MRN, date of birth, ID band Patient awake    Reviewed: Allergy & Precautions, NPO status , Patient's Chart, lab work & pertinent test results  History of Anesthesia Complications Negative for: history of anesthetic complications  Airway Mallampati: III  TM Distance: <3 FB Neck ROM: full    Dental  (+) Chipped, Poor Dentition, Missing   Pulmonary neg shortness of breath, sleep apnea ,    Pulmonary exam normal        Cardiovascular Exercise Tolerance: Good hypertension, (-) angina+CHF  Normal cardiovascular exam     Neuro/Psych negative neurological ROS  negative psych ROS   GI/Hepatic negative GI ROS, Neg liver ROS,   Endo/Other  negative endocrine ROS  Renal/GU Renal disease  negative genitourinary   Musculoskeletal   Abdominal   Peds  Hematology  (+) Blood dyscrasia, anemia ,   Anesthesia Other Findings Past Medical History: 09/30/2017: AKI (acute kidney injury) (HCC) No date: Alcohol abuse No date: Atrial fibrillation (HCC)     Comment:  a. patient reports being diagnosed with A fib ~ 15-20               years prior in [place], [place]; b. noted to be in Afib 1/19;               c. CHADS2VASc => 4 (HTN, age x 2, vascular dosease) No date: CHF (congestive heart failure) (HCC) No date: HLD (hyperlipidemia) No date: Hypertension No date: Macrocytic anemia No date: Morbid obesity (HCC)  Past Surgical History: 11/14/2017: CARDIOVERSION; N/A     Comment:  Procedure: CARDIOVERSION;  Surgeon: Antonieta Iba,               MD;  Location: [facility] ORS;  Service: Cardiovascular;                Laterality: N/A; No date: KNEE SURGERY  BMI    Body Mass Index: 33.00 kg/m      Reproductive/Obstetrics negative OB ROS                             Anesthesia Physical Anesthesia Plan  ASA: 4  Anesthesia Plan: General   Post-op Pain Management:     Induction: Intravenous  PONV Risk Score and Plan: Propofol infusion and TIVA  Airway Management Planned: Natural Airway and Nasal Cannula  Additional Equipment:   Intra-op Plan:   Post-operative Plan:   Informed Consent: I have reviewed the patients History and Physical, chart, labs and discussed the procedure including the risks, benefits and alternatives for the proposed anesthesia with the patient or authorized representative who has indicated his/her understanding and acceptance.     Dental Advisory Given  Plan Discussed with: Anesthesiologist, CRNA and Surgeon  Anesthesia Plan Comments: (Patient consented for risks of anesthesia including but not limited to:  - adverse reactions to medications - risk of airway placement if required - damage to eyes, teeth, lips or other oral mucosa - nerve damage due to positioning  - sore throat or hoarseness - Damage to heart, brain, nerves, lungs, other parts of body or loss of life  Patient voiced understanding.)        Anesthesia Quick Evaluation

## 2022-02-28 NOTE — Transfer of Care (Signed)
Immediate Anesthesia Transfer of Care Note  Patient: Michael Rowe  Procedure(s) Performed: ESOPHAGOGASTRODUODENOSCOPY (EGD) WITH PROPOFOL  Patient Location: PACU  Anesthesia Type:MAC  Level of Consciousness: drowsy  Airway & Oxygen Therapy: Patient Spontanous Breathing and Patient connected to face mask  Post-op Assessment: Report given to RN and Post -op Vital signs reviewed and stable  Post vital signs: Reviewed and stable  Last Vitals:  Vitals Value Taken Time  BP 126/105 02/28/22 1344  Temp 35.7 C 02/28/22 1344  Pulse 113 02/28/22 1344  Resp 18 02/28/22 1344  SpO2 100 % 02/28/22 1344  Vitals shown include unvalidated device data.  Last Pain:  Vitals:   02/28/22 1344  TempSrc: Temporal  PainSc:          Complications: No notable events documented.

## 2022-02-28 NOTE — Assessment & Plan Note (Addendum)
Resolved with replacement.  Mg 1.6 on 6/30.  Replaced with 2 g IV mag sulfate.  Monitor and replace further as needed.

## 2022-02-28 NOTE — ED Notes (Signed)
Report to Ariel, RN

## 2022-02-28 NOTE — Care Plan (Signed)
EGD showed clean based ulcer.  - Needs PPI po or IV BID for 3 months - Hold DOAC (xarelto) for at least 7 days - needs repeat EGD in 3 months to check for healing - needs alcohol cessation counseling - if hemoglobin stable tomorrow then can be discharged from GI standpoint unless needs inpatient monitoring for potential alcohol withdrawals - will follow peripherally  Merlyn Lot MD, MPH [facility] GI

## 2022-02-28 NOTE — Progress Notes (Signed)
Admission profile updated. ?

## 2022-02-28 NOTE — Hospital Course (Signed)
Michael Rowe is a 81 y.o. male with medical history significant for atrial fibrillation on chronic anticoagulation therapy, obesity, hypertension, dyslipidemia, chronic diastolic dysfunction CHF who presented on 02/27/2022 for evaluation of back pain since a fall several weeks ago on June 1 and dizziness with shortness of breath on exertion and generalized weakness.  He also reports noticing very dark tarry stools for over a week.  Patient takes Xarelto for his A-fib.  Evaluation in the ED revealed a drop in hemoglobin from 12.6 down to 9.0, repeat further declined to 6.9. 2 units PRBCs were ordered for transfusion and patient was admitted to the hospital for further evaluation and management.  Gastroenterology consulted.

## 2022-02-28 NOTE — Anesthesia Procedure Notes (Signed)
Date/Time: 02/28/2022 1:28 PM  Performed by: Malva Cogan, CRNAPre-anesthesia Checklist: Patient identified, Emergency Drugs available, Suction available, Patient being monitored and Timeout performed Patient Re-evaluated:Patient Re-evaluated prior to induction Oxygen Delivery Method: Simple face mask Induction Type: IV induction Placement Confirmation: CO2 detector and positive ETCO2

## 2022-02-28 NOTE — Assessment & Plan Note (Signed)
Body mass index is 33 kg/m. Complicates overall care and prognosis.  Recommend lifestyle modifications including physical activity and diet for weight loss and overall long-term health.

## 2022-02-28 NOTE — Consult Note (Signed)
Consultation  Referring Provider:    Hospitalist Admit date: 02/27/2022 Consult date: 02/28/2022         Reason for Consultation: Melena              HPI:   Michael Rowe is a 81 y.o. gentleman with history of alcohol abuse, a. Fib on DOAC with last dose Tuesday morning, hypertension, and obesity who presented with melena and symptomatic anemia. Patient fell earlier this month and started taking Aleve BID for three weeks. About two weeks ago noticed melenic stools. Hemoglobin is 5 grams below baseline. He has never had an EGD/Colonoscopy before. No family history of GI malignancies. He drinks 4-6 alcoholic beverages daily. No abdominal pain.   Past Medical History:  Diagnosis Date   AKI (acute kidney injury) ([place]) 09/30/2017   Alcohol abuse    Atrial fibrillation ([place])    a. patient reports being diagnosed with A fib ~ 15-20 years prior in [place], [place]; b. noted to be in Afib 1/19; c. CHADS2VASc => 4 (HTN, age x 2, vascular dosease)   CHF (congestive heart failure) ([place])    HLD (hyperlipidemia)    Hypertension    Macrocytic anemia    Morbid obesity ([place])     Past Surgical History:  Procedure Laterality Date   CARDIOVERSION N/A 11/14/2017   Procedure: CARDIOVERSION;  Surgeon: Minna Merritts, MD;  Location: [facility] ORS;  Service: Cardiovascular;  Laterality: N/A;   KNEE SURGERY      Family History  Problem Relation Age of Onset   Liver disease Mother    Heart disease Father    CAD Brother    Cancer Brother    Cancer Brother    Cancer Brother      Social History   Tobacco Use   Smoking status: Never   Smokeless tobacco: Never  Vaping Use   Vaping Use: Never used  Substance Use Topics   Alcohol use: Yes    Alcohol/week: 4.0 - 5.0 standard drinks of alcohol    Types: 3 - 4 Cans of beer, 1 Shots of liquor per week    Comment: 3-5 beer daily 10/24/17 drinks 3-4 beers a week and a shot a week   Drug use: No    Prior to Admission medications   Medication Sig Start Date  End Date Taking? Authorizing Provider  atorvastatin (LIPITOR) 40 MG tablet Take 1 tablet (40 mg total) by mouth daily. Please call for an appointment for more refills 05/19/19  Yes Johnson, Megan P, DO  diltiazem (CARDIZEM CD) 120 MG 24 hr capsule TAKE 1 CAPSULE (120 MG TOTAL) DAILY. (NEED MD APPOINTMENT FOR REFILLS) 07/19/19  Yes Gollan, Kathlene November, MD  folic acid (FOLVITE) 1 MG tablet Take 1 mg by mouth daily. 04/30/21  Yes [provider]  furosemide (LASIX) 20 MG tablet Take 1 tablet (20 mg total) by mouth daily. 11/25/18  Yes Gollan, Kathlene November, MD  metoprolol tartrate (LOPRESSOR) 50 MG tablet TAKE 1 TABLET TWICE DAILY Patient taking differently: Take 25 mg by mouth 2 (two) times daily. 07/24/19  Yes Johnson, Megan P, DO  rivaroxaban (XARELTO) 20 MG TABS tablet Take 1 tablet (20 mg total) by mouth daily with supper. 11/11/18  Yes Gollan, Kathlene November, MD  PARoxetine (PAXIL) 20 MG tablet Take 1 tablet (20 mg total) by mouth daily. Patient not taking: Reported on 02/27/2022 11/06/18   Valerie Roys, DO    Current Facility-Administered Medications  Medication Dose Route Frequency Provider Last Rate Last  Admin   0.9 %  sodium chloride infusion  250 mL Intravenous PRN Agbata, Tochukwu, MD       0.9 %  sodium chloride infusion   Intravenous Continuous Agbata, Tochukwu, MD 100 mL/hr at 02/28/22 0441 New Bag at 02/28/22 0441   acetaminophen (TYLENOL) tablet 650 mg  650 mg Oral Q6H PRN Agbata, Tochukwu, MD       Or   acetaminophen (TYLENOL) suppository 650 mg  650 mg Rectal Q6H PRN Agbata, Tochukwu, MD       atorvastatin (LIPITOR) tablet 40 mg  40 mg Oral Daily Agbata, Tochukwu, MD   40 mg at 02/28/22 0950   folic acid (FOLVITE) tablet 1 mg  1 mg Oral Daily Agbata, Tochukwu, MD   1 mg at 02/28/22 0950   lidocaine (LIDODERM) 5 % 1 patch  1 patch Transdermal Q24H Agbata, Tochukwu, MD   1 patch at 02/27/22 2227   LORazepam (ATIVAN) tablet 1-4 mg  1-4 mg Oral Q1H PRN Agbata, Tochukwu, MD       Or    LORazepam (ATIVAN) injection 1-4 mg  1-4 mg Intravenous Q1H PRN Agbata, Tochukwu, MD       LORazepam (ATIVAN) tablet 0-4 mg  0-4 mg Oral Q6H Agbata, Tochukwu, MD       Followed by   Melene Muller ON 03/01/2022] LORazepam (ATIVAN) tablet 0-4 mg  0-4 mg Oral Q12H Agbata, Tochukwu, MD       multivitamin with minerals tablet 1 tablet  1 tablet Oral Daily Agbata, Tochukwu, MD   1 tablet at 02/28/22 0950   ondansetron (ZOFRAN) tablet 4 mg  4 mg Oral Q6H PRN Agbata, Tochukwu, MD       Or   ondansetron (ZOFRAN) injection 4 mg  4 mg Intravenous Q6H PRN Agbata, Tochukwu, MD       pantoprazole (PROTONIX) injection 40 mg  40 mg Intravenous Q12H Agbata, Tochukwu, MD   40 mg at 02/28/22 0950   sodium chloride flush (NS) 0.9 % injection 3 mL  3 mL Intravenous Q12H Agbata, Tochukwu, MD   3 mL at 02/27/22 2227   sodium chloride flush (NS) 0.9 % injection 3 mL  3 mL Intravenous Q12H Agbata, Tochukwu, MD   3 mL at 02/28/22 0914   sodium chloride flush (NS) 0.9 % injection 3 mL  3 mL Intravenous PRN Agbata, Tochukwu, MD       thiamine tablet 100 mg  100 mg Oral Daily Agbata, Tochukwu, MD   100 mg at 02/28/22 1610   Or   thiamine (B-1) injection 100 mg  100 mg Intravenous Daily Agbata, Tochukwu, MD       Current Outpatient Medications  Medication Sig Dispense Refill   atorvastatin (LIPITOR) 40 MG tablet Take 1 tablet (40 mg total) by mouth daily. Please call for an appointment for more refills 30 tablet 0   diltiazem (CARDIZEM CD) 120 MG 24 hr capsule TAKE 1 CAPSULE (120 MG TOTAL) DAILY. (NEED MD APPOINTMENT FOR REFILLS) 15 capsule 0   folic acid (FOLVITE) 1 MG tablet Take 1 mg by mouth daily.     furosemide (LASIX) 20 MG tablet Take 1 tablet (20 mg total) by mouth daily. 90 tablet 1   metoprolol tartrate (LOPRESSOR) 50 MG tablet TAKE 1 TABLET TWICE DAILY (Patient taking differently: Take 25 mg by mouth 2 (two) times daily.) 60 tablet 0   rivaroxaban (XARELTO) 20 MG TABS tablet Take 1 tablet (20 mg total) by mouth daily  with supper. 30 tablet 6  PARoxetine (PAXIL) 20 MG tablet Take 1 tablet (20 mg total) by mouth daily. (Patient not taking: Reported on 02/27/2022) 90 tablet 1    Allergies as of 02/27/2022   (No Known Allergies)     Review of Systems:    All systems reviewed and negative except where noted in HPI.  Review of Systems  Constitutional:  Negative for chills and fever.  Cardiovascular:  Negative for chest pain.  Gastrointestinal:  Positive for melena. Negative for abdominal pain, constipation, diarrhea, nausea and vomiting.  Musculoskeletal:  Positive for back pain.  Skin:  Negative for itching and rash.  Neurological:  Positive for dizziness. Negative for focal weakness.  Psychiatric/Behavioral:  Positive for substance abuse.   All other systems reviewed and are negative.      Physical Exam:  Vital signs in last 24 hours: Temp:  [97.8 F (36.6 C)-98.7 F (37.1 C)] 98.6 F (37 C) (06/29 0711) Pulse Rate:  [49-123] 123 (06/29 1212) Resp:  [12-53] 18 (06/29 1212) BP: (85-135)/(49-85) 135/85 (06/29 1212) SpO2:  [96 %-100 %] 100 % (06/29 1212) Weight:  [104.3 kg] 104.3 kg (06/28 1652)   General:   Pleasant in NAD Head:  Normocephalic and atraumatic. Eyes:   No icterus.   Conjunctiva pink. Mouth: Mucosa pink moist, no lesions. Neck:  Supple; no masses felt Lungs:  No respiratory distress Abdomen:   Flat, soft, nondistended, nontender Rectal:  Melenic stools Msk:  MAEW x4, No clubbing or cyanosis. Strength 5/5 Neurologic:  Alert and  oriented x4;  Cranial nerves II-XII intact.  Skin:  Warm, dry, pink without significant lesions or rashes. Psych:  Alert and cooperative. Normal affect.  LAB RESULTS: Recent Labs    02/27/22 1714 02/28/22 0000 02/28/22 0829  WBC 9.7  --   --   HGB 9.0* 6.9* 7.8*  HCT 27.8* 21.0* 24.0*  PLT 240  --   --    BMET Recent Labs    02/27/22 1714 02/28/22 0523  NA 138 137  K 3.6 4.1  CL 101 107  CO2 25 24  GLUCOSE 130* 138*  BUN 37* 52*   CREATININE 1.34* 1.19  CALCIUM 8.4* 7.4*   LFT No results for input(s): "PROT", "ALBUMIN", "AST", "ALT", "ALKPHOS", "BILITOT", "BILIDIR", "IBILI" in the last 72 hours. PT/INR Recent Labs    02/27/22 1714  LABPROT 27.6*  INR 2.6*    STUDIES: CT CHEST ABDOMEN PELVIS W CONTRAST  Result Date: 02/27/2022 CLINICAL DATA:  Polytrauma, blunt EXAM: CT CHEST, ABDOMEN, AND PELVIS WITH CONTRAST TECHNIQUE: Multidetector CT imaging of the chest, abdomen and pelvis was performed following the standard protocol during bolus administration of intravenous contrast. RADIATION DOSE REDUCTION: This exam was performed according to the departmental dose-optimization program which includes automated exposure control, adjustment of the mA and/or kV according to patient size and/or use of iterative reconstruction technique. CONTRAST:  OMNIPAQUE IOHEXOL 300 MG/ML  SOLN COMPARISON:  09/30/2017 FINDINGS: CT CHEST FINDINGS Cardiovascular: Heart is normal size. Aorta is normal caliber. Diffuse coronary artery and scattered aortic calcifications. Mediastinum/Nodes: No mediastinal, hilar, or axillary adenopathy. Trachea and esophagus are unremarkable. Thyroid unremarkable. Lungs/Pleura: Lungs are clear. No focal airspace opacities or suspicious nodules. No effusions. Musculoskeletal: Mild bilateral gynecomastia. Mild compression deformity at T12, age indeterminate but new since 2019. CT ABDOMEN PELVIS FINDINGS Hepatobiliary: No hepatic injury or perihepatic hematoma. Gallbladder is unremarkable. Pancreas: No focal abnormality or ductal dilatation. Spleen: No splenic injury or perisplenic hematoma. Adrenals/Urinary Tract: No adrenal hemorrhage or renal injury identified. Bladder  is unremarkable. 2.4 cm exophytic cyst off the upper pole of the right kidney. No follow-up imaging recommended. Stomach/Bowel: Normal appendix. Sigmoid diverticulosis. No active diverticulitis. Stomach and small bowel decompressed, unremarkable.  Vascular/Lymphatic: Aortic atherosclerosis. No evidence of aneurysm or adenopathy. Reproductive: No visible focal abnormality. Other: No free fluid or free air. Musculoskeletal: No acute bony abnormality. IMPRESSION: No acute findings or evidence of significant traumatic injury in the chest, abdomen or pelvis. Coronary artery disease, aortic atherosclerosis. Sigmoid diverticulosis. Electronically Signed   By: Rolm Baptise M.D.   On: 02/27/2022 18:36       Impression / Plan:   81 y/o gentleman with melenic stools after taking daily NSAIDS concerning for peptic ulcer disease  - IV PPI BID - NPO - trend CBC's - hold DOAC - maintain active type and screen - two large bore IV's - will plan for EGD today, further recs after procedure  Raylene Miyamoto MD, MPH [place]

## 2022-03-01 DIAGNOSIS — D62 Acute posthemorrhagic anemia: Secondary | ICD-10-CM | POA: Diagnosis not present

## 2022-03-01 LAB — PREPARE RBC (CROSSMATCH)

## 2022-03-01 LAB — BASIC METABOLIC PANEL
Anion gap: 6 (ref 5–15)
BUN: 44 mg/dL — ABNORMAL HIGH (ref 8–23)
CO2: 25 mmol/L (ref 22–32)
Calcium: 7.6 mg/dL — ABNORMAL LOW (ref 8.9–10.3)
Chloride: 109 mmol/L (ref 98–111)
Creatinine, Ser: 1.14 mg/dL (ref 0.61–1.24)
GFR, Estimated: >60 mL/min (ref >60)
Glucose, Bld: 117 mg/dL — ABNORMAL HIGH (ref 70–99)
Potassium: 3.6 mmol/L (ref 3.5–5.1)
Sodium: 140 mmol/L (ref 135–145)

## 2022-03-01 LAB — CBC
HCT: 20.3 % — ABNORMAL LOW (ref 39.0–52.0)
Hemoglobin: 6.6 g/dL — ABNORMAL LOW (ref 13.0–17.0)
MCH: 31.9 pg (ref 26.0–34.0)
MCHC: 32.5 g/dL (ref 30.0–36.0)
MCV: 98.1 fL (ref 80.0–100.0)
Platelets: 157 10*3/uL (ref 150–400)
RBC: 2.07 MIL/uL — ABNORMAL LOW (ref 4.22–5.81)
RDW: 20.3 % — ABNORMAL HIGH (ref 11.5–15.5)
WBC: 8.2 10*3/uL (ref 4.0–10.5)
nRBC: 0 % (ref 0.0–0.2)

## 2022-03-01 LAB — MAGNESIUM: Magnesium: 2.5 mg/dL — ABNORMAL HIGH (ref 1.7–2.4)

## 2022-03-01 LAB — HEMOGLOBIN AND HEMATOCRIT, BLOOD
HCT: 20.9 % — ABNORMAL LOW (ref 39.0–52.0)
HCT: 21.1 % — ABNORMAL LOW (ref 39.0–52.0)
HCT: 23.4 % — ABNORMAL LOW (ref 39.0–52.0)
HCT: 25.3 % — ABNORMAL LOW (ref 39.0–52.0)
Hemoglobin: 6.9 g/dL — ABNORMAL LOW (ref 13.0–17.0)
Hemoglobin: 7 g/dL — ABNORMAL LOW (ref 13.0–17.0)
Hemoglobin: 7.8 g/dL — ABNORMAL LOW (ref 13.0–17.0)
Hemoglobin: 8.4 g/dL — ABNORMAL LOW (ref 13.0–17.0)

## 2022-03-01 MED ORDER — SODIUM CHLORIDE 0.9% IV SOLUTION: Freq: Once | INTRAVENOUS | Status: DC

## 2022-03-01 MED ORDER — METOPROLOL TARTRATE 25 MG PO TABS
25.0000 mg | ORAL_TABLET | Freq: Two times a day (BID) | ORAL | Status: DC
  Administered 2022-03-01 – 2022-03-02 (×2): 25 mg via ORAL
  Filled 2022-03-01 (×2): qty 1

## 2022-03-01 NOTE — Progress Notes (Signed)
Cardiac monitoring order renewed 

## 2022-03-01 NOTE — Progress Notes (Signed)
Patient's Hgb at 21:00 on June 30 th: 7.8 (dropped from previous value of 8.4)  Hct: 23.4 (dropped from previous value of 25.3  Patient expresses some concern over results. Urinary output noted and measured is 125 ml Yellow/ clear/straw.   No blood noted in urine.  Previous nurse mentioned 2 units of blood given in ED prior to inpatient status and 1 additional unit given on unit for a total of 3 units this admission.

## 2022-03-01 NOTE — Evaluation (Signed)
Physical Therapy Evaluation Patient Details Name: Michael Rowe MRN: [id number] DOB: [birth year] Today's Date: 03/01/2022  History of Present Illness  Michael Rowe is a 81 y.o. male with past medical history of hypertension, hyperlipidemia, atrial fibrillation on Xarelto, and CHF who presents to the hospital with of back pain and dizziness.   Clinical Impression  Pt received in Semi-Fowler's position and agreeable to therapy.  Pt currently receiving blood transfusion, but cleared by nursing to participate in therapy.  Pt has been performing bed-level exercises and is able to demonstrate those to therapist upon arrival.  Pt then proceeded to perform bed mobility with good technique and slight dizziness noted when coming upright at EOB.  Pt proceeded to ambulate ~100 feet before experiencing dizziness again and some light-headedness.  Pt placed back in bed with pt recovering quickly.  Pt left with all needs met and call bell within reach.  Pt advised to utilize a RW at home and would need a new one due to not sure of the condition of current one that has been stored under the house.  Pt currently not strong enough and would benefit from stair training in order to be safe when d/c home.    Pt will continue to benefit from skilled therapy in order to address deficits listed below.        Recommendations for follow up therapy are one component of a multi-disciplinary discharge planning process, led by the attending physician.  Recommendations may be updated based on patient status, additional functional criteria and insurance authorization.  Follow Up Recommendations No PT follow up      Assistance Recommended at Discharge None  Patient can return home with the following  A little help with walking and/or transfers;A little help with bathing/dressing/bathroom    Equipment Recommendations Rolling walker (2 wheels)  Recommendations for Other Services       Functional Status Assessment  Patient has had a recent decline in their functional status and demonstrates the ability to make significant improvements in function in a reasonable and predictable amount of time.     Precautions / Restrictions Restrictions Weight Bearing Restrictions: No      Mobility  Bed Mobility Overal bed mobility: Modified Independent                  Transfers Overall transfer level: Needs assistance Equipment used: Rolling walker (2 wheels) Transfers: Sit to/from Stand             General transfer comment: pt required verbal cuing with first attempt for hand placement, but able to perform without any other assistance.    Ambulation/Gait Ambulation/Gait assistance: Min guard Gait Distance (Feet): 100 Feet Assistive device: Rolling walker (2 wheels) Gait Pattern/deviations: WFL(Within Functional Limits) Gait velocity: decreased     General Gait Details: pt able to ambulate well, just fatigued and needing to return to bed due to dizziness and fatigue.  Stairs            Wheelchair Mobility    Modified Rankin (Stroke Patients Only)       Balance Overall balance assessment: Needs assistance Sitting-balance support: Feet supported Sitting balance-Leahy Scale: Good     Standing balance support: Bilateral upper extremity supported Standing balance-Leahy Scale: Fair Standing balance comment: pt requires UE support for balance at this time.                             Pertinent Vitals/Pain  Pain Assessment Pain Assessment: No/denies pain    Home Living Family/patient expects to be discharged to:: Private residence Living Arrangements: Spouse/significant other Available Help at Discharge: Family Type of Home: House Home Access: Stairs to enter Entrance Stairs-Rails: Can reach both Entrance Stairs-Number of Steps: 8   Home Layout: One level Home Equipment: Conservation officer, nature (2 wheels);Cane - single point;Shower seat;Grab bars - tub/shower;Hand  held shower head      Prior Function Prior Level of Function : Independent/Modified Independent                     Hand Dominance   Dominant Hand: Right    Extremity/Trunk Assessment   Upper Extremity Assessment Upper Extremity Assessment: Overall WFL for tasks assessed    Lower Extremity Assessment Lower Extremity Assessment: Overall WFL for tasks assessed       Communication   Communication: HOH  Cognition Arousal/Alertness: Awake/alert Behavior During Therapy: WFL for tasks assessed/performed Overall Cognitive Status: Within Functional Limits for tasks assessed                                          General Comments      Exercises Total Joint Exercises Ankle Circles/Pumps: AROM, Strengthening, Both, 10 reps, Supine Quad Sets: AROM, Strengthening, Both, 10 reps, Supine Gluteal Sets: AROM, Strengthening, Both, 10 reps, Supine Hip ABduction/ADduction: AROM, Strengthening, Both, 10 reps, Supine Straight Leg Raises: AROM, Strengthening, Both, 10 reps, Supine   Assessment/Plan    PT Assessment Patient needs continued PT services  PT Problem List Decreased strength;Decreased activity tolerance;Decreased mobility       PT Treatment Interventions DME instruction;Gait training;Stair training;Functional mobility training;Therapeutic activities;Therapeutic exercise;Balance training;Neuromuscular re-education    PT Goals (Current goals can be found in the Care Plan section)  Acute Rehab PT Goals Patient Stated Goal: to get stronger and go home. PT Goal Formulation: With patient Time For Goal Achievement: 03/15/22 Potential to Achieve Goals: Good    Frequency Min 2X/week     Co-evaluation               AM-PAC PT "6 Clicks" Mobility  Outcome Measure Help needed turning from your back to your side while in a flat bed without using bedrails?: A Little Help needed moving from lying on your back to sitting on the side of a flat bed  without using bedrails?: A Little Help needed moving to and from a bed to a chair (including a wheelchair)?: A Little Help needed standing up from a chair using your arms (e.g., wheelchair or bedside chair)?: A Little Help needed to walk in hospital room?: A Little Help needed climbing 3-5 steps with a railing? : A Lot 6 Click Score: 17    End of Session Equipment Utilized During Treatment: Gait belt Activity Tolerance: Patient limited by fatigue Patient left: in bed;with call bell/phone within reach;with nursing/sitter in room;with family/visitor present Nurse Communication: Mobility status PT Visit Diagnosis: Unsteadiness on feet (R26.81);Repeated falls (R29.6);Muscle weakness (generalized) (M62.81);History of falling (Z91.81);Other abnormalities of gait and mobility (R26.89);Difficulty in walking, not elsewhere classified (R26.2)    Time: 1320-1406 PT Time Calculation (min) (ACUTE ONLY): 46 min   Charges:   PT Evaluation $PT Eval Low Complexity: 1 Low PT Treatments $Gait Training: 8-22 mins $Therapeutic Exercise: 8-22 mins        Gwenlyn Saran, PT, DPT 03/01/22, 2:58 PM   Sanjuana Letters  Phillipe Clemon 03/01/2022, 2:56 PM

## 2022-03-01 NOTE — Progress Notes (Signed)
Progress Note   Patient: Michael Rowe EZM:[id number] DOB: May 11, 1941 DOA: 02/27/2022     2 DOS: the patient was seen and examined on 03/01/2022   Brief hospital course: Michael Rowe is a 81 y.o. male with medical history significant for atrial fibrillation on chronic anticoagulation therapy, obesity, hypertension, dyslipidemia, chronic diastolic dysfunction CHF who presented on 02/27/2022 for evaluation of back pain since a fall several weeks ago on June 1 and dizziness with shortness of breath on exertion and generalized weakness.  He also reports noticing very dark tarry stools for over a week.  Patient takes Xarelto for his A-fib.  Evaluation in the ED revealed a drop in hemoglobin from 12.6 down to 9.0, repeat further declined to 6.9. 2 units PRBCs were ordered for transfusion and patient was admitted to the hospital for further evaluation and management.  Gastroenterology consulted.   Assessment and Plan: * ABLA (acute blood loss anemia) Presented for evaluation melena and anemic symptoms of generalized weakness, dizziness and dyspnea on exertion.  Noted to have hemoglobin in May 12.6, down to 9.0 on admission and further dropped to 6.9.  Transfused 2 units PRBCs with improvement to 8.0.  EGD on 6/29 showed a nonbleeding gastric ulcer, gastritis and duodenal erosions.  6/30: Hemoglobin down again this morning 6.6, 6.9 --1 unit PRBCs ordered for transfusion --Check posttransfusion H&H -- GI consulted EGD done, no colonoscopy planned at this time -- Hold Xarelto -- Hold Cardizem and metoprolol with soft BP -- Protonix 40 mg IV every 12 -- Iron studies were normal, B12 normal --Monitor H&H and transfuse as needed  HTN (hypertension) Metoprolol and Cardizem were held due to soft BPs, resume when able  Depression, major, single episode, in partial remission (HCC) Chronic, stable.  Continue Paxil  Hypomagnesemia Resolved with replacement.  Mg 1.6 on 6/30.  Replaced with  2 g IV mag sulfate.  Monitor and replace further as needed.  Obesity (BMI 30-39.9) Body mass index is 33 kg/m. Complicates overall care and prognosis.  Recommend lifestyle modifications including physical activity and diet for weight loss and overall long-term health.   Chronic diastolic heart failure (HCC) Stable and not acutely decompensated. --Hold furosemide and metoprolol with soft BPs, resume when able  Alcohol abuse Patient reported typically consumes 3-4 drinks daily usually liquor.  Started on CIWA protocol on admission. 6/30 no signs of withdrawal symptoms --Stop CIWA --Started on multivitamin, thiamine and folic acid --TOC consulted to provide resources for alcohol cessation.  Atrial fibrillation with RVR (HCC) Presented in A-fib with a rapid ventricular rate most likely related to volume depletion from GI bleed. --Hold metoprolol and Cardizem due to relative hypotension --Hold Xarelto --Monitor on telemetry         Subjective: Patient awake sitting up in bed seen with wife at bedside.  He reports overall feeling well.  He has not been dizzy or lightheaded when standing or ambulating to bathroom.  He did have another bowel movement that was dark maroon overnight earlier this morning.  No abdominal pain or other acute complaints.  Physical Exam: Vitals:   03/01/22 1208 03/01/22 1224 03/01/22 1311 03/01/22 1536  BP: 112/68 (!) 100/59 118/64 118/71  Pulse: (!) 111 (!) 102 (!) 108 (!) 104  Resp: 18 16 18 16   Temp: 97.9 F (36.6 C) 98.1 F (36.7 C) 97.8 F (36.6 C) 98.4 F (36.9 C)  TempSrc: Oral Oral Oral Oral  SpO2: 100% 99% 99% 99%  Weight:      Height:  General exam: awake, alert, no acute distress, obese HEENT: moist mucus membranes, hearing grossly normal  Respiratory system: Normal respiratory effort, on room air Cardiovascular system: Brisk cap refill, no peripheral edema Gastrointestinal system: Protuberant abdomen, nontender Central nervous  system: A&O x3. no gross focal neurologic deficits, normal speech Extremities: moves all, no tremors, normal tone Psychiatry: normal mood, congruent affect, judgement and insight appear normal   Data Reviewed:  Notable labs: Glucose 112, BUN 44, calcium 7.6, magnesium 2.5, hemoglobin this morning 6.6 >> 6.9  Family Communication: Wife and daughter at bedside on rounds  Disposition: Status is: Inpatient Remains inpatient appropriate because: Required blood transfusion today, discharge pending stable hemoglobin for at least 24 to 48 hours without need for transfusion   Planned Discharge Destination: Home    Time spent: 35 minutes  Author: Pennie Banter, DO 03/01/2022 5:46 PM  For on call review [email or web address].

## 2022-03-01 NOTE — Progress Notes (Signed)
Patient HR jumping up into the 120-130s with more regularity.  Paged MD.   MD reordered home metoprolol.  Will continue to monitor.

## 2022-03-01 NOTE — Care Management Important Message (Signed)
Important Message  Patient Details  Name: Michael Rowe MRN: [id number] Date of Birth: 09/17/1940   Medicare Important Message Given:  Yes     [name] 03/01/2022, 12:20 PM

## 2022-03-01 NOTE — Progress Notes (Signed)
GI Inpatient Follow-up Note  Subjective:  Patient seen and doing well. Episode of dark red stool last night. Hemoglobin lower so will be getting repeat pRBC transfusion. His dizziness has improved.  Scheduled Inpatient Medications:   sodium chloride   Intravenous Once   atorvastatin  40 mg Oral Daily   folic acid  1 mg Oral Daily   lidocaine  1 patch Transdermal Q24H   LORazepam  0-4 mg Oral Q6H   Followed by   LORazepam  0-4 mg Oral Q12H   multivitamin with minerals  1 tablet Oral Daily   pantoprazole (PROTONIX) IV  40 mg Intravenous Q12H   sodium chloride flush  3 mL Intravenous Q12H   sodium chloride flush  3 mL Intravenous Q12H   thiamine  100 mg Oral Daily   Or   thiamine  100 mg Intravenous Daily    Continuous Inpatient Infusions:    sodium chloride     sodium chloride 300 mL/hr at 02/28/22 2231    PRN Inpatient Medications:  sodium chloride, acetaminophen **OR** acetaminophen, LORazepam **OR** LORazepam, ondansetron **OR** ondansetron (ZOFRAN) IV, sodium chloride flush  Review of Systems:  Review of Systems  Constitutional:  Negative for chills and fever.  Respiratory:  Negative for shortness of breath.   Cardiovascular:  Negative for chest pain.  Gastrointestinal:  Positive for blood in stool. Negative for abdominal pain, constipation, nausea and vomiting.  Musculoskeletal:  Negative for joint pain.  Skin:  Negative for rash.  Neurological:  Negative for focal weakness.  Psychiatric/Behavioral:  Negative for substance abuse.   All other systems reviewed and are negative.     Physical Examination: BP 105/63 (BP Location: Left Arm)   Pulse (!) 121   Temp 98.4 F (36.9 C) (Oral)   Resp 14   Ht 5\' 10"  (1.778 m)   Wt 104.3 kg   SpO2 100%   BMI 33.00 kg/m  Gen: NAD, alert and oriented x 4 HEENT: PEERLA, EOMI, Neck: supple, no JVD or thyromegaly Chest: No respiratory distress Abd: soft, non-tender, non-distended Ext: no edema, well perfused with 2+  pulses, Skin: no rash or lesions noted Lymph: no LAD  Data: Lab Results  Component Value Date   WBC 8.2 03/01/2022   HGB 6.9 (L) 03/01/2022   HCT 20.9 (L) 03/01/2022   MCV 98.1 03/01/2022   PLT 157 03/01/2022   Recent Labs  Lab 03/01/22 0009 03/01/22 0600 03/01/22 0739  HGB 7.0* 6.6* 6.9*   Lab Results  Component Value Date   NA 140 03/01/2022   K 3.6 03/01/2022   CL 109 03/01/2022   CO2 25 03/01/2022   BUN 44 (H) 03/01/2022   CREATININE 1.14 03/01/2022   Lab Results  Component Value Date   ALT 5 11/06/2018   AST 18 11/06/2018   ALKPHOS 68 11/06/2018   BILITOT 1.0 11/06/2018   Recent Labs  Lab 02/27/22 1714  INR 2.6*   Assessment/Plan: Mr. Po is a 81 y.o. 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  [facility] MD, MPH [facility]

## 2022-03-02 DIAGNOSIS — D62 Acute posthemorrhagic anemia: Secondary | ICD-10-CM | POA: Diagnosis not present

## 2022-03-02 LAB — BPAM RBC
Blood Product Expiration Date: 202307062359
Blood Product Expiration Date: 202307182359
Blood Product Expiration Date: 202307272359
ISSUE DATE / TIME: 202306290208
ISSUE DATE / TIME: 202306290450
ISSUE DATE / TIME: 202306301204
Unit Type and Rh: 6200
Unit Type and Rh: 6200
Unit Type and Rh: 6200

## 2022-03-02 LAB — TYPE AND SCREEN
ABO/RH(D): A POS
Antibody Screen: NEGATIVE
Unit division: 0
Unit division: 0
Unit division: 0

## 2022-03-02 LAB — BASIC METABOLIC PANEL
Anion gap: 4 — ABNORMAL LOW (ref 5–15)
BUN: 23 mg/dL (ref 8–23)
CO2: 22 mmol/L (ref 22–32)
Calcium: 7.5 mg/dL — ABNORMAL LOW (ref 8.9–10.3)
Chloride: 110 mmol/L (ref 98–111)
Creatinine, Ser: 0.99 mg/dL (ref 0.61–1.24)
GFR, Estimated: >60 mL/min (ref >60)
Glucose, Bld: 100 mg/dL — ABNORMAL HIGH (ref 70–99)
Potassium: 3.8 mmol/L (ref 3.5–5.1)
Sodium: 136 mmol/L (ref 135–145)

## 2022-03-02 LAB — CBC
HCT: 24 % — ABNORMAL LOW (ref 39.0–52.0)
Hemoglobin: 7.9 g/dL — ABNORMAL LOW (ref 13.0–17.0)
MCH: 31.3 pg (ref 26.0–34.0)
MCHC: 32.9 g/dL (ref 30.0–36.0)
MCV: 95.2 fL (ref 80.0–100.0)
Platelets: 170 10*3/uL (ref 150–400)
RBC: 2.52 MIL/uL — ABNORMAL LOW (ref 4.22–5.81)
RDW: 21.1 % — ABNORMAL HIGH (ref 11.5–15.5)
WBC: 6.8 10*3/uL (ref 4.0–10.5)
nRBC: 0 % (ref 0.0–0.2)

## 2022-03-02 LAB — HEMOGLOBIN AND HEMATOCRIT, BLOOD
HCT: 27 % — ABNORMAL LOW (ref 39.0–52.0)
Hemoglobin: 9 g/dL — ABNORMAL LOW (ref 13.0–17.0)

## 2022-03-02 MED ORDER — METOPROLOL TARTRATE 25 MG PO TABS: 25.0000 mg | ORAL_TABLET | Freq: Two times a day (BID) | ORAL | 1 refills | Status: AC

## 2022-03-02 MED ORDER — PANTOPRAZOLE SODIUM 40 MG PO TBEC: 40.0000 mg | DELAYED_RELEASE_TABLET | Freq: Two times a day (BID) | ORAL | 1 refills | Status: AC

## 2022-03-02 MED ORDER — FUROSEMIDE 20 MG PO TABS: 20.0000 mg | ORAL_TABLET | Freq: Every day | ORAL | 1 refills | Status: AC | PRN

## 2022-03-02 NOTE — Discharge Summary (Addendum)
Physician Discharge Summary   Patient: Michael Rowe MRN: [id number] DOB: 1941/04/26  Admit date:     02/27/2022  Discharge date: 03/02/2022  Discharge Physician: Pennie Banter   PCP: Myrene Buddy, NP   Recommendations at discharge:    Follow up with Primary care within 1 week Repeat CBC, BMP within 1 week Follow up on Blood Pressure (metoprolol dose lowered, diltiazem and lasix held during admission), BP's soft on discharge so there are held until follow up.  Advised home BP monitoring. Follow up with Gastroenterology   Discharge Diagnoses: Principal Problem:   ABLA (acute blood loss anemia) Active Problems:   HTN (hypertension)   Depression, major, single episode, in partial remission (HCC)   Atrial fibrillation with RVR (HCC)   Alcohol abuse   Chronic diastolic heart failure (HCC)   Obesity (BMI 30-39.9)   Hypomagnesemia  Resolved Problems:   * No resolved hospital problems. *  Hospital Course: Michael Rowe is a 81 y.o. male with medical history significant for atrial fibrillation on chronic anticoagulation therapy, obesity, hypertension, dyslipidemia, chronic diastolic dysfunction CHF who presented on 02/27/2022 for evaluation of back pain since a fall several weeks ago on June 1 and dizziness with shortness of breath on exertion and generalized weakness.  He also reports noticing very dark tarry stools for over a week.  Patient takes Xarelto for his A-fib.  Evaluation in the ED revealed a drop in hemoglobin from 12.6 down to 9.0, repeat further declined to 6.9. 2 units PRBCs were ordered for transfusion and patient was admitted to the hospital for further evaluation and management.  Gastroenterology consulted.   Assessment and Plan: * ABLA (acute blood loss anemia) Presented for evaluation melena and anemic symptoms of generalized weakness, dizziness and dyspnea on exertion.  Noted to have hemoglobin in May 12.6, down to 9.0 on admission and further  dropped to 6.9.  Blood transfusion ordered EGD on 6/29 showed a nonbleeding gastric ulcer, gastritis and duodenal erosions. Patient received total of 3 units PRBCs transfused during the admission  7/1: Hemoglobin improved to 7.9, repeat this afternoon 9.0  --Repeat CBC within 1 week -- GI consulted EGD done, no colonoscopy planned at this time -- Hold Xarelto for 6 more days --Treated with Protonix 40 mg IV every 12 hrs -- Oral Protonix twice daily -- Iron studies were normal, B12 normal   HTN (hypertension) Metoprolol and Cardizem were held due to soft BPs, resume when able  Depression, major, single episode, in partial remission (HCC) Chronic, stable.  Continue Paxil  Hypomagnesemia Resolved with replacement.  Mg 1.6 on 6/30.  Replaced with 2 g IV mag sulfate.  Monitor and replace further as needed.  Obesity (BMI 30-39.9) Body mass index is 33 kg/m. Complicates overall care and prognosis.  Recommend lifestyle modifications including physical activity and diet for weight loss and overall long-term health.   Chronic diastolic heart failure (HCC) Stable and not acutely decompensated. --Hold furosemide and metoprolol with soft BPs, resume when able  Alcohol abuse Patient reported typically consumes 3-4 drinks daily usually liquor.  Started on CIWA protocol on admission. 6/30 no signs of withdrawal symptoms --Stop CIWA --Started on multivitamin, thiamine and folic acid --TOC consulted to provide resources for alcohol cessation.  Atrial fibrillation with RVR (HCC) Presented in A-fib with a rapid ventricular rate most likely related to volume depletion from GI bleed. --Hold metoprolol and Cardizem due to relative hypotension --Hold Xarelto --Monitor on telemetry  Consultants: Gastroenterology Procedures performed: EGD Disposition: Home Diet recommendation:  Discharge Diet Orders (From admission, onward)     Start     Ordered   03/02/22 0000  Diet - low  sodium heart healthy        03/02/22 1359           Cardiac diet DISCHARGE MEDICATION: Allergies as of 03/02/2022   No Known Allergies      Medication List     STOP taking these medications    diltiazem 120 MG 24 hr capsule Commonly known as: CARDIZEM CD   PARoxetine 20 MG tablet Commonly known as: PAXIL   rivaroxaban 20 MG Tabs tablet Commonly known as: XARELTO       TAKE these medications    atorvastatin 40 MG tablet Commonly known as: LIPITOR Take 1 tablet (40 mg total) by mouth daily. Please call for an appointment for more refills   folic acid 1 MG tablet Commonly known as: FOLVITE Take 1 mg by mouth daily.   furosemide 20 MG tablet Commonly known as: Lasix Take 1 tablet (20 mg total) by mouth daily as needed (swelling). What changed:  when to take this reasons to take this   metoprolol tartrate 25 MG tablet Commonly known as: LOPRESSOR Take 1 tablet (25 mg total) by mouth 2 (two) times daily. What changed:  medication strength how much to take   pantoprazole 40 MG tablet Commonly known as: Protonix Take 1 tablet (40 mg total) by mouth 2 (two) times daily.        Follow-up Information     Regis Bill, MD. Call.   Specialty: Gastroenterology Why: schedule GI follow up appointment Contact information: [address] [place] [address] [phone]         Myrene Buddy, NP. Call.   Specialty: Internal Medicine Why: Hospital follow up within 1 week for repeat CBC and BP check Contact information: [address] [place] [address] [phone]                Discharge Exam: Filed Weights   02/27/22 1652  Weight: 104.3 kg   General exam: awake, alert, no acute distress, obese HEENT: amoist mucus membranes, hearing grossly normal  Respiratory system: On room air, normal respiratory effort. Cardiovascular system: RRR, no pedal edema.   Gastrointestinal system: Protuberant abdomen, nontender,  +bowel sounds. Central nervous system: A&O x4. no gross focal neurologic deficits, normal speech Extremities: moves all, no edema, normal tone Skin: dry, intact, normal temperature Psychiatry: normal mood, congruent affect, judgement and insight appear normal   Condition at discharge: stable  The results of significant diagnostics from this hospitalization (including imaging, microbiology, ancillary and laboratory) are listed below for reference.   Imaging Studies: CT CHEST ABDOMEN PELVIS W CONTRAST  Result Date: 02/27/2022 CLINICAL DATA:  Polytrauma, blunt EXAM: CT CHEST, ABDOMEN, AND PELVIS WITH CONTRAST TECHNIQUE: Multidetector CT imaging of the chest, abdomen and pelvis was performed following the standard protocol during bolus administration of intravenous contrast. RADIATION DOSE REDUCTION: This exam was performed according to the departmental dose-optimization program which includes automated exposure control, adjustment of the mA and/or kV according to patient size and/or use of iterative reconstruction technique. CONTRAST:  OMNIPAQUE IOHEXOL 300 MG/ML  SOLN COMPARISON:  09/30/2017 FINDINGS: CT CHEST FINDINGS Cardiovascular: Heart is normal size. Aorta is normal caliber. Diffuse coronary artery and scattered aortic calcifications. Mediastinum/Nodes: No mediastinal, hilar, or axillary adenopathy. Trachea and esophagus are unremarkable. Thyroid unremarkable. Lungs/Pleura: Lungs  are clear. No focal airspace opacities or suspicious nodules. No effusions. Musculoskeletal: Mild bilateral gynecomastia. Mild compression deformity at T12, age indeterminate but new since 2019. CT ABDOMEN PELVIS FINDINGS Hepatobiliary: No hepatic injury or perihepatic hematoma. Gallbladder is unremarkable. Pancreas: No focal abnormality or ductal dilatation. Spleen: No splenic injury or perisplenic hematoma. Adrenals/Urinary Tract: No adrenal hemorrhage or renal injury identified. Bladder is unremarkable. 2.4 cm  exophytic cyst off the upper pole of the right kidney. No follow-up imaging recommended. Stomach/Bowel: Normal appendix. Sigmoid diverticulosis. No active diverticulitis. Stomach and small bowel decompressed, unremarkable. Vascular/Lymphatic: Aortic atherosclerosis. No evidence of aneurysm or adenopathy. Reproductive: No visible focal abnormality. Other: No free fluid or free air. Musculoskeletal: No acute bony abnormality. IMPRESSION: No acute findings or evidence of significant traumatic injury in the chest, abdomen or pelvis. Coronary artery disease, aortic atherosclerosis. Sigmoid diverticulosis. Electronically Signed   By: Charlett Nose M.D.   On: 02/27/2022 18:36    Microbiology: No results found for this or any previous visit.  Labs: CBC: Recent Labs  Lab 02/27/22 1714 02/28/22 0000 03/01/22 0600 03/01/22 0739 03/01/22 1622 03/01/22 2100 03/02/22 0531 03/02/22 1304  WBC 9.7  --  8.2  --   --   --  6.8  --   HGB 9.0*   < > 6.6* 6.9* 8.4* 7.8* 7.9* 9.0*  HCT 27.8*   < > 20.3* 20.9* 25.3* 23.4* 24.0* 27.0*  MCV 105.7*  --  98.1  --   --   --  95.2  --   PLT 240  --  157  --   --   --  170  --    < > = values in this interval not displayed.   Basic Metabolic Panel: Recent Labs  Lab 02/27/22 1714 02/27/22 2051 02/28/22 0500 02/28/22 0523 03/01/22 0600 03/02/22 0531  NA 138  --   --  137 140 136  K 3.6  --   --  4.1 3.6 3.8  CL 101  --   --  107 109 110  CO2 25  --   --  24 25 22   GLUCOSE 130*  --   --  138* 117* 100*  BUN 37*  --   --  52* 44* 23  CREATININE 1.34*  --   --  1.19 1.14 0.99  CALCIUM 8.4*  --   --  7.4* 7.6* 7.5*  MG  --  1.6* 1.6*  --  2.5*  --    Liver Function Tests: No results for input(s): "AST", "ALT", "ALKPHOS", "BILITOT", "PROT", "ALBUMIN" in the last 168 hours. CBG: No results for input(s): "GLUCAP" in the last 168 hours.  Discharge time spent: less than 30 minutes.  Signed: , DO [facility] 03/05/2022

## 2022-03-02 NOTE — Plan of Care (Signed)

## 2022-03-02 NOTE — TOC Initial Note (Signed)
Transition of Care [facility]) - Initial/Assessment Note    Patient Details  Name: Michael Rowe MRN: [id number] Date of Birth: 11-Apr-1941  Transition of Care [facility]) CM/SW Contact:    Liliana Cline, LCSW Phone Number: 03/02/2022, 10:34 AM  Clinical Narrative:              CSW spoke with patient regarding DC planning. Patient lives with his wife. At baseline, he drives himself to appointments. Wife to transport home at discharge. PCP is Lenon Oms NP. Pharmacy is [facility] in [place]. Patient is agreeable to a RW being ordered. RW ordered through [employer] to be delivered to bedside today. Patient declines SA resources at this time, says he has quit alcohol but does not feel he needs resources.       Expected Discharge Plan: Home/Self Care Barriers to Discharge: Continued Medical Work up   Patient Goals and CMS Choice Patient states their goals for this hospitalization and ongoing recovery are:: to return home with his wife CMS Medicare.gov Compare Post Acute Care list provided to:: Patient Choice offered to / list presented to : Patient  Expected Discharge Plan and Services Expected Discharge Plan: Home/Self Care       Living arrangements for the past 2 months: Single Family Home                 DME Arranged: Walker rolling DME Agency: [employer] Date DME Agency Contacted: 03/02/22   Representative spoke with at DME Agency: Leavy Cella            Prior Living Arrangements/Services Living arrangements for the past 2 months: Single Family Home Lives with:: Spouse Patient language and need for interpreter reviewed:: Yes Do you feel safe going back to the place where you live?: Yes      Need for Family Participation in Patient Care: Yes (Comment) Care giver support system in place?: Yes (comment)   Criminal Activity/Legal Involvement Pertinent to Current Situation/Hospitalization: No - Comment as needed  Activities of Daily Living Home Assistive Devices/Equipment:  Eyeglasses, [place] (specify type) ADL Screening (condition at time of admission) Patient's cognitive ability adequate to safely complete daily activities?: Yes Is the patient deaf or have difficulty hearing?: No Does the patient have difficulty seeing, even when wearing glasses/contacts?: No Does the patient have difficulty concentrating, remembering, or making decisions?: No Patient able to express need for assistance with ADLs?: Yes Does the patient have difficulty dressing or bathing?: No Independently performs ADLs?: Yes (appropriate for developmental age) Does the patient have difficulty walking or climbing stairs?: Yes Weakness of Legs: Both Weakness of Arms/Hands: None  Permission Sought/Granted Permission sought to share information with : Facility Medical sales representative, Family Supports Permission granted to share information with : Yes, Verbal Permission Granted  Share Information with NAME: spouse  Permission granted to share info w AGENCY: DME        Emotional Assessment       Orientation: : Oriented to Self, Oriented to Place, Oriented to  Time, Oriented to Situation Alcohol / Substance Use: Not Applicable Psych Involvement: No (comment)  Admission diagnosis:  Dizziness [R42] Lower GI bleed [K92.2] Fall, initial encounter [W19.XXXA] ABLA (acute blood loss anemia) [D62] Patient Active Problem List   Diagnosis Date Noted   Obesity (BMI 30-39.9) 02/28/2022   Hypomagnesemia 02/28/2022   ABLA (acute blood loss anemia) 02/27/2022   Depression, major, single episode, in partial remission (HCC) 11/06/2018   Chronic diastolic heart failure (HCC) 10/24/2017   Snoring 10/24/2017   Morbid obesity (  HCC) 10/01/2017   Alcohol abuse 10/01/2017   HTN (hypertension) 09/30/2017   HLD (hyperlipidemia) 09/30/2017   Atrial fibrillation with RVR (HCC) 09/30/2017   PCP:  Myrene Buddy, NP Pharmacy:   [facility] [address] [place] - [address] [place] [address] Phone: [phone] Fax: [phone]  [facility] - [place], [place] - [address] [place] [address] Phone: [phone] Fax: [phone]     Social Determinants of Health (SDOH) Interventions    Readmission Risk Interventions    03/02/2022   10:32 AM  Readmission Risk Prevention Plan  Post Dischage Appt Complete  Medication Screening Complete  Transportation Screening Complete

## 2022-03-02 NOTE — Progress Notes (Signed)
Labs drawn at 6 am this morning:  Hgb on 07/01: 7.9 Hct on 07/01:  24.0  Daughter, Idalia Needle: [phone] would like updates (with patient's consent). Patient and family have some anxiety about the cause of the blood loss.  Patient is 81 y o m FC with acute gi bleed and complaints of fatigue and weakness.  Alert and oriented by 4.  Heart Healthy diet. 20 G IV in Left Hand. On Room Air. Heart Healthy diet. Bruise present on Right arm. Takes pills whole this shift. Bilateral edema +1 in both legs. Stand by assist due to weakness but can ambulate. NSR on telly this shift but previous history of A-fib with HR in the 120's.  Endoscopy on 06/29 showed a non bleeding ulcer. GI consult/ is following.

## 2022-03-08 DIAGNOSIS — I482 Chronic atrial fibrillation, unspecified: Secondary | ICD-10-CM | POA: Diagnosis not present

## 2022-03-08 DIAGNOSIS — K254 Chronic or unspecified gastric ulcer with hemorrhage: Secondary | ICD-10-CM | POA: Diagnosis not present

## 2022-03-08 DIAGNOSIS — F1090 Alcohol use, unspecified, uncomplicated: Secondary | ICD-10-CM | POA: Diagnosis not present

## 2022-03-08 DIAGNOSIS — K269 Duodenal ulcer, unspecified as acute or chronic, without hemorrhage or perforation: Secondary | ICD-10-CM | POA: Diagnosis not present

## 2022-03-08 DIAGNOSIS — Z09 Encounter for follow-up examination after completed treatment for conditions other than malignant neoplasm: Secondary | ICD-10-CM | POA: Diagnosis not present

## 2022-03-08 DIAGNOSIS — Z79899 Other long term (current) drug therapy: Secondary | ICD-10-CM | POA: Diagnosis not present

## 2022-03-18 DIAGNOSIS — G8929 Other chronic pain: Secondary | ICD-10-CM | POA: Diagnosis not present

## 2022-03-18 DIAGNOSIS — M5440 Lumbago with sciatica, unspecified side: Secondary | ICD-10-CM | POA: Diagnosis not present

## 2022-03-18 DIAGNOSIS — M47816 Spondylosis without myelopathy or radiculopathy, lumbar region: Secondary | ICD-10-CM | POA: Diagnosis not present

## 2022-03-18 DIAGNOSIS — K253 Acute gastric ulcer without hemorrhage or perforation: Secondary | ICD-10-CM | POA: Diagnosis not present

## 2022-03-18 DIAGNOSIS — M545 Low back pain, unspecified: Secondary | ICD-10-CM | POA: Diagnosis not present

## 2022-03-21 ENCOUNTER — Telehealth: Payer: Self-pay

## 2022-03-21 NOTE — Telephone Encounter (Signed)
Sent request to [facility] to send xrays through [facility] portal

## 2022-03-21 NOTE — Telephone Encounter (Signed)
Please schedule with Danielle. 

## 2022-03-21 NOTE — Telephone Encounter (Signed)
Please schedule with Danielle in 2-3 weeks with xrays prior. Patient has a T11, T12 and L1 fracture.

## 2022-03-22 ENCOUNTER — Other Ambulatory Visit: Payer: Self-pay

## 2022-03-22 ENCOUNTER — Ambulatory Visit
Admission: RE | Admit: 2022-03-22 | Discharge: 2022-03-22 | Disposition: A | Discharge status: Home / Self Care | Payer: Self-pay | Source: Ambulatory Visit | Attending: Neurosurgery | Admitting: Neurosurgery

## 2022-03-22 DIAGNOSIS — M5489 Other dorsalgia: Secondary | ICD-10-CM

## 2022-03-22 NOTE — Telephone Encounter (Signed)
Xray images received through [facility]. Outside study order signed. Notified radiology to push images to PACS.

## 2022-03-25 NOTE — Telephone Encounter (Signed)
He confirmed appt for 04/18/22

## 2022-03-25 NOTE — Telephone Encounter (Signed)
Need to verify images are in [facility]

## 2022-03-27 DIAGNOSIS — K253 Acute gastric ulcer without hemorrhage or perforation: Secondary | ICD-10-CM | POA: Diagnosis not present

## 2022-04-05 NOTE — Telephone Encounter (Signed)
Images are not visible. Need to notify Thayer Ohm in radiology.

## 2022-04-08 NOTE — Telephone Encounter (Signed)
Images are visible in "patient images" under Epic button

## 2022-04-12 ENCOUNTER — Ambulatory Visit: Payer: Medicare HMO | Admitting: Neurosurgery

## 2022-04-12 ENCOUNTER — Ambulatory Visit
Admission: RE | Admit: 2022-04-12 | Discharge: 2022-04-12 | Disposition: A | Discharge status: Home / Self Care | Payer: Medicare HMO | Attending: Neurosurgery | Admitting: Neurosurgery

## 2022-04-12 ENCOUNTER — Other Ambulatory Visit: Payer: Self-pay

## 2022-04-12 ENCOUNTER — Ambulatory Visit
Admission: RE | Admit: 2022-04-12 | Discharge: 2022-04-12 | Disposition: A | Discharge status: Home / Self Care | Payer: Medicare HMO | Source: Ambulatory Visit | Attending: Neurosurgery | Admitting: Neurosurgery

## 2022-04-12 ENCOUNTER — Encounter: Payer: Self-pay | Admitting: Neurosurgery

## 2022-04-12 VITALS — BP 114/72 | Ht 70.0 in | Wt 220.0 lb

## 2022-04-12 DIAGNOSIS — S22080A Wedge compression fracture of T11-T12 vertebra, initial encounter for closed fracture: Secondary | ICD-10-CM

## 2022-04-12 DIAGNOSIS — S22000S Wedge compression fracture of unspecified thoracic vertebra, sequela: Secondary | ICD-10-CM | POA: Insufficient documentation

## 2022-04-12 DIAGNOSIS — S32010A Wedge compression fracture of first lumbar vertebra, initial encounter for closed fracture: Secondary | ICD-10-CM | POA: Diagnosis not present

## 2022-04-12 DIAGNOSIS — S22000A Wedge compression fracture of unspecified thoracic vertebra, initial encounter for closed fracture: Secondary | ICD-10-CM

## 2022-04-12 NOTE — Progress Notes (Signed)
Referring Physician:  Louellen Molder, NP [address] [facility] [place],  [place] [address]  Primary Physician:  Michael Buddy, NP  History of Present Illness: 04/12/2022 [name] is here today for T11, 12 and L1 compression fractures. He states he had a fall back in June when getting out of bed and experience low back pain at that time. He underwent imaging which showed fractures and has not been treating them in any particular manner.  He states that since then his pain has improved significantly although he does continue to have some discomfort in his low back with movement.  He denies any radiating leg pain.   Michael Rowe has no symptoms of cervical myelopathy.  The symptoms are causing a significant impact on the patient's life.   Review of Systems:  A 10 point review of systems is negative, except for the pertinent positives and negatives detailed in the HPI.  Past Medical History: Past Medical History:  Diagnosis Date   AKI (acute kidney injury) (HCC) 09/30/2017   Alcohol abuse    Atrial fibrillation (HCC)    a. patient reports being diagnosed with A fib ~ 15-20 years prior in [place], [place]; b. noted to be in Afib 1/19; c. CHADS2VASc => 4 (HTN, age x 2, vascular dosease)   CHF (congestive heart failure) (HCC)    HLD (hyperlipidemia)    Hypertension    Macrocytic anemia    Morbid obesity (HCC)     Past Surgical History: Past Surgical History:  Procedure Laterality Date   CARDIOVERSION N/A 11/14/2017   Procedure: CARDIOVERSION;  Surgeon: Antonieta Iba, MD;  Location: [facility] ORS;  Service: Cardiovascular;  Laterality: N/A;   ESOPHAGOGASTRODUODENOSCOPY (EGD) WITH PROPOFOL N/A 02/28/2022   Procedure: ESOPHAGOGASTRODUODENOSCOPY (EGD) WITH PROPOFOL;  Surgeon: Regis Bill, MD;  Location: [facility] ENDOSCOPY;  Service: Endoscopy;  Laterality: N/A;   KNEE SURGERY      Allergies: Allergies as of 04/12/2022   (No Known  Allergies)    Medications: Outpatient Encounter Medications as of 04/12/2022  Medication Sig   atorvastatin (LIPITOR) 40 MG tablet Take 1 tablet (40 mg total) by mouth daily. Please call for an appointment for more refills   folic acid (FOLVITE) 1 MG tablet Take 1 mg by mouth daily.   furosemide (LASIX) 20 MG tablet Take 1 tablet (20 mg total) by mouth daily as needed (swelling).   metoprolol tartrate (LOPRESSOR) 25 MG tablet Take 1 tablet (25 mg total) by mouth 2 (two) times daily.   pantoprazole (PROTONIX) 40 MG tablet Take 1 tablet (40 mg total) by mouth 2 (two) times daily.   No facility-administered encounter medications on file as of 04/12/2022.    Social History: Social History   Tobacco Use   Smoking status: Never   Smokeless tobacco: Never  Vaping Use   Vaping Use: Never used  Substance Use Topics   Alcohol use: Yes    Alcohol/week: 4.0 - 5.0 standard drinks of alcohol    Types: 3 - 4 Cans of beer, 1 Shots of liquor per week    Comment: last drink yesterday morning 02/27/22   Drug use: No    Family Medical History: Family History  Problem Relation Age of Onset   Liver disease Mother    Heart disease Father    CAD Brother    Cancer Brother    Cancer Brother    Cancer Brother     Physical Examination:  Today's Vitals  04/12/22 0932  BP: 114/72  Weight: 99.8 kg  Height: 5\' 10"  (1.778 m)  PainSc: 3   PainLoc: Back   Body mass index is 31.57 kg/m.   General: Patient is well developed, well nourished, calm, collected, and in no apparent distress. Attention to examination is appropriate.  Psychiatric: Patient is non-anxious.  Head:  Pupils equal, round, and reactive to light.  ENT:  Oral mucosa appears well hydrated.  Neck:   Supple.    Respiratory: Patient is breathing without any difficulty.  Extremities: No edema.  Vascular: Palpable dorsal pedal pulses.  Skin:   On exposed skin, there are no abnormal skin lesions.  NEUROLOGICAL:      Awake, alert, oriented to person, place, and time.  Speech is clear and fluent. Fund of knowledge is appropriate.   Cranial Nerves: Pupils equal round and reactive to light.  Facial tone is symmetric.  Facial sensation is symmetric.  ROM of spine: full.  Palpation of spine: non tender.    Strength: Chronic bilateral foot drop otherwise good and equal strength throughout.  Reflexes are 1+ and symmetric at the biceps, triceps, brachioradialis, patella 0 achilles.   Hoffman's is absent.  Clonus is not present.  Toes are down-going.  Bilateral upper and lower extremity sensation is intact to light touch.    Gait- ambulates with a cane  Medical Decision Making  Imaging:  04/12/22 thoracolumbar xrays Mild worsening since 6/28 CT but stable when compared to July xrays   02/27/22 CT abdomen and pelvis  IMPRESSION: No acute findings or evidence of significant traumatic injury in the chest, abdomen or pelvis.   Coronary artery disease, aortic atherosclerosis.   Sigmoid diverticulosis.     Electronically Signed   By: 03/01/22 M.D.   On: 02/27/2022 18:36  I have personally reviewed the images and agree with the above interpretation.  Assessment and Plan: [name] is a pleasant 81 y.o. male with thoracic and lumbar compression fractures that are largely stable.  The patient reports improvement and has not been treating them with a brace.  I offered him a TLSO brace however he declined at this time.  Given his reassuring x-rays and clinical picture do not think that it is absolutely necessary to brace him.  He was cautioned on return precautions.  I will otherwise see him going forward on an as-needed basis.  He expressed understanding was in agreement with this plan.   Thank you for involving me in the care of this patient.   I spent a total of 20 minutes in both face-to-face and non-face-to-face activities for this visit on the date of this encounter including chart review, image  review, HPI, physical exam, and documentation.   [facility]

## 2022-04-18 ENCOUNTER — Ambulatory Visit: Payer: Medicare HMO | Admitting: Neurosurgery

## 2022-08-06 DIAGNOSIS — F1010 Alcohol abuse, uncomplicated: Secondary | ICD-10-CM | POA: Diagnosis not present

## 2022-08-06 DIAGNOSIS — Z79899 Other long term (current) drug therapy: Secondary | ICD-10-CM | POA: Diagnosis not present

## 2022-08-06 DIAGNOSIS — I5032 Chronic diastolic (congestive) heart failure: Secondary | ICD-10-CM | POA: Diagnosis not present

## 2022-08-06 DIAGNOSIS — I482 Chronic atrial fibrillation, unspecified: Secondary | ICD-10-CM | POA: Diagnosis not present

## 2022-08-06 DIAGNOSIS — F1090 Alcohol use, unspecified, uncomplicated: Secondary | ICD-10-CM | POA: Diagnosis not present

## 2022-08-06 DIAGNOSIS — K269 Duodenal ulcer, unspecified as acute or chronic, without hemorrhage or perforation: Secondary | ICD-10-CM | POA: Diagnosis not present

## 2022-08-06 DIAGNOSIS — I110 Hypertensive heart disease with heart failure: Secondary | ICD-10-CM | POA: Diagnosis not present

## 2022-08-06 DIAGNOSIS — I10 Essential (primary) hypertension: Secondary | ICD-10-CM | POA: Diagnosis not present

## 2022-08-06 DIAGNOSIS — Z23 Encounter for immunization: Secondary | ICD-10-CM | POA: Diagnosis not present

## 2022-10-05 ENCOUNTER — Emergency Department
Admission: EM | Admit: 2022-10-05 | Discharge: 2022-10-05 | Disposition: A | Discharge status: Home / Self Care | Payer: Medicare HMO | Attending: Emergency Medicine | Admitting: Emergency Medicine

## 2022-10-05 ENCOUNTER — Emergency Department: Payer: Medicare HMO

## 2022-10-05 DIAGNOSIS — I110 Hypertensive heart disease with heart failure: Secondary | ICD-10-CM | POA: Diagnosis not present

## 2022-10-05 DIAGNOSIS — S61213A Laceration without foreign body of left middle finger without damage to nail, initial encounter: Secondary | ICD-10-CM | POA: Insufficient documentation

## 2022-10-05 DIAGNOSIS — I4891 Unspecified atrial fibrillation: Secondary | ICD-10-CM | POA: Diagnosis not present

## 2022-10-05 DIAGNOSIS — S62631B Displaced fracture of distal phalanx of left index finger, initial encounter for open fracture: Secondary | ICD-10-CM | POA: Diagnosis not present

## 2022-10-05 DIAGNOSIS — S68712A Complete traumatic transmetacarpal amputation of left hand, initial encounter: Secondary | ICD-10-CM | POA: Diagnosis not present

## 2022-10-05 DIAGNOSIS — S6992XA Unspecified injury of left wrist, hand and finger(s), initial encounter: Secondary | ICD-10-CM | POA: Diagnosis present

## 2022-10-05 DIAGNOSIS — S61209A Unspecified open wound of unspecified finger without damage to nail, initial encounter: Secondary | ICD-10-CM

## 2022-10-05 DIAGNOSIS — S61211A Laceration without foreign body of left index finger without damage to nail, initial encounter: Secondary | ICD-10-CM | POA: Diagnosis not present

## 2022-10-05 DIAGNOSIS — S62601B Fracture of unspecified phalanx of left index finger, initial encounter for open fracture: Secondary | ICD-10-CM

## 2022-10-05 DIAGNOSIS — I509 Heart failure, unspecified: Secondary | ICD-10-CM | POA: Diagnosis not present

## 2022-10-05 DIAGNOSIS — Z7901 Long term (current) use of anticoagulants: Secondary | ICD-10-CM | POA: Diagnosis not present

## 2022-10-05 DIAGNOSIS — S62611B Displaced fracture of proximal phalanx of left index finger, initial encounter for open fracture: Secondary | ICD-10-CM | POA: Diagnosis not present

## 2022-10-05 DIAGNOSIS — W312XXA Contact with powered woodworking and forming machines, initial encounter: Secondary | ICD-10-CM | POA: Insufficient documentation

## 2022-10-05 DIAGNOSIS — Z23 Encounter for immunization: Secondary | ICD-10-CM | POA: Diagnosis not present

## 2022-10-05 MED ORDER — TRANEXAMIC ACID FOR EPISTAXIS
500.0000 mg | Freq: Once | TOPICAL | Status: AC
  Administered 2022-10-05: 500 mg via TOPICAL
  Filled 2022-10-05: qty 10

## 2022-10-05 MED ORDER — CEPHALEXIN 500 MG PO CAPS: 500.0000 mg | ORAL_CAPSULE | Freq: Three times a day (TID) | ORAL | 0 refills | Status: AC

## 2022-10-05 MED ORDER — TETANUS-DIPHTH-ACELL PERTUSSIS 5-2.5-18.5 LF-MCG/0.5 IM SUSY
0.5000 mL | PREFILLED_SYRINGE | Freq: Once | INTRAMUSCULAR | Status: AC
  Administered 2022-10-05: 0.5 mL via INTRAMUSCULAR
  Filled 2022-10-05: qty 0.5

## 2022-10-05 MED ORDER — LIDOCAINE HCL (PF) 1 % IJ SOLN
10.0000 mL | Freq: Once | INTRAMUSCULAR | Status: AC
  Administered 2022-10-05: 10 mL
  Filled 2022-10-05: qty 10

## 2022-10-05 NOTE — ED Provider Notes (Signed)
[facility] Provider Note    Event Date/Time   First MD Initiated Contact with Patient 10/05/22 1532     (approximate)   History   Finger Injury   HPI  Michael Rowe is a 82 y.o. male with history of hypertension, A-fib, CHF presents emergency department with lacerations to the left index and middle finger from a table saw.  Unsure of his last Tdap.  Patient is on a blood thinner.      Physical Exam   Triage Vital Signs: ED Triage Vitals  Enc Vitals Group     BP 10/05/22 1524 (!) 174/145     Pulse Rate 10/05/22 1519 80     Resp 10/05/22 1519 18     Temp 10/05/22 1519 97.7 F (36.5 C)     Temp Source 10/05/22 1519 Oral     SpO2 10/05/22 1519 100 %     Weight 10/05/22 1520 220 lb 0.3 oz (99.8 kg)     Height 10/05/22 1520 5\' 10"  (1.778 m)     Head Circumference --      Peak Flow --      Pain Score 10/05/22 1520 5     Pain Loc --      Pain Edu? --      Excl. in [place]? --     Most recent vital signs: Vitals:   10/05/22 1519 10/05/22 1524  BP:  (!) 174/145  Pulse: 80   Resp: 18   Temp: 97.7 F (36.5 C)   SpO2: 100%      General: Awake, no distress.   CV:  Good peripheral perfusion. regular rate and  rhythm Resp:  Normal effort.  Abd:  No distention.   Other:  Right hand with large lacerations noted to the second and third fingers.  Second finger is mostly an avulsion with nail involvement, the third finger may actually be able to be sutured, large amount of bleeding noted, Xeroform and dressing applied   ED Results / Procedures / Treatments   Labs (all labs ordered are listed, but only abnormal results are displayed) Labs Reviewed - No data to display   EKG     RADIOLOGY X-ray of the left hand    PROCEDURES:   .Marland KitchenLaceration Repair  Date/Time: 10/05/2022 5:01 PM  Performed by: Versie Starks, PA-C Authorized by: Versie Starks, PA-C   Consent:    Consent obtained:  Verbal   Consent given by:  Patient    Risks, benefits, and alternatives were discussed: yes     Risks discussed:  Infection, pain, retained foreign body, tendon damage, vascular damage, poor wound healing, poor cosmetic result, need for additional repair and nerve damage   Alternatives discussed:  Delayed treatment Universal protocol:    Procedure explained and questions answered to patient or proxy's satisfaction: yes     Immediately prior to procedure, a time out was called: yes     Patient identity confirmed:  Verbally with patient Anesthesia:    Anesthesia method:  Nerve block   Block anesthetic:  Lidocaine 1% w/o epi   Block injection procedure:  Anatomic landmarks identified, introduced needle, incremental injection, anatomic landmarks palpated and negative aspiration for blood   Block outcome:  Anesthesia achieved Laceration details:    Location:  Finger   Finger location:  L long finger   Length (cm):  2 Pre-procedure details:    Preparation:  Patient was prepped and draped in usual sterile fashion and imaging obtained  to evaluate for foreign bodies Exploration:    Hemostasis achieved with:  Tourniquet   Imaging obtained: x-ray     Imaging outcome: foreign body not noted     Wound exploration: wound explored through full range of motion and entire depth of wound visualized     Wound extent: underlying fracture     Wound extent: areolar tissue not violated, fascia not violated, no foreign body, no signs of injury, no nerve damage, no tendon damage and no vascular damage     Contaminated: no   Treatment:    Area cleansed with:  Saline and povidone-iodine   Amount of cleaning:  Extensive   Irrigation solution:  Sterile saline   Irrigation method:  Syringe and tap   Debridement:  Minimal Skin repair:    Repair method:  Sutures   Suture size:  5-0   Suture material:  Nylon   Suture technique:  Simple interrupted   Number of sutures:  4 Approximation:    Approximation:  Close Repair type:    Repair type:   Simple Post-procedure details:    Dressing:  Non-adherent dressing and bulky dressing   Procedure completion:  Tolerated well, no immediate complications Comments:     Tuft fracture .Nail Removal  Date/Time: 10/05/2022 5:04 PM  Performed by: Versie Starks, PA-C Authorized by: Versie Starks, PA-C   Consent:    Consent obtained:  Verbal   Consent given by:  Patient   Risks, benefits, and alternatives were discussed: yes     Risks discussed:  Bleeding, incomplete removal, infection, pain and permanent nail deformity   Alternatives discussed:  No treatment Universal protocol:    Procedure explained and questions answered to patient or proxy's satisfaction: yes     Immediately prior to procedure, a time out was called: yes     Patient identity confirmed:  Verbally with patient Location:    Hand:  L index finger Anesthesia:    Anesthesia method:  Nerve block   Block anesthetic:  Lidocaine 1% w/o epi   Block technique:  Digital block   Block injection procedure:  Anatomic landmarks identified, introduced needle, incremental injection, anatomic landmarks palpated and negative aspiration for blood   Block outcome:  Anesthesia achieved Nail Removal:    Nail removed:  Partial   Nail side:  Ulnar Post-procedure details:    Procedure completion:  Tolerated well, no immediate complications Comments:     Txa gauze and surgicel applied to finger Skin avulstion cleaned with normal saline     MEDICATIONS ORDERED IN ED: Medications  tranexamic acid (CYKLOKAPRON) 1000 MG/10ML topical solution 500 mg (has no administration in time range)  Tdap (BOOSTRIX) injection 0.5 mL (0.5 mLs Intramuscular Given 10/05/22 1701)  lidocaine (PF) (XYLOCAINE) 1 % injection 10 mL (10 mLs Infiltration Given 10/05/22 1601)     IMPRESSION / MDM / [place] / ED COURSE  I reviewed the triage vital signs and the nursing notes.                              Differential diagnosis includes, but is not  limited to, laceration, fracture, contusion, avulsion  Patient's presentation is most consistent with acute presentation with potential threat to life or bodily function.   X-ray of the left t hand  Tdap ordered  X-ray of the right hand independently reviewed interpreted by me as having tuft fractures at the left index and middle finger  Patient is bleeding through the Surgicel, will try TXA gauze. TXA can also combine with the Surgicel to decrease bleeding.  Patient was given a bulky dressing and discharged in stable condition.  Strict instructions to follow-up with [facility] to see the hand specialist.  Information was given to his daughter.  He was started on Keflex 500 3 times daily for infection prevention due to the open wounds and the fractures.  He was discharged in stable condition.     FINAL CLINICAL IMPRESSION(S) / ED DIAGNOSES   Final diagnoses:  Open displaced fracture of phalanx of left index finger, unspecified phalanx, initial encounter  Avulsion of finger, initial encounter  Laceration of left middle finger, foreign body presence unspecified, nail damage status unspecified, initial encounter  Laceration of left index finger, foreign body presence unspecified, nail damage status unspecified, initial encounter     Rx / DC Orders   ED Discharge Orders          Ordered    cephALEXin (KEFLEX) 500 MG capsule  3 times daily        10/05/22 1647             Note:  This document was prepared using Dragon voice recognition software and may include unintentional dictation errors.    Versie Starks, PA-C 10/05/22 1738    Lavonia Drafts, MD 10/06/22 [id number]

## 2022-10-05 NOTE — ED Triage Notes (Signed)
Pt presents to the ED via POV due to finger injury. Pt states his fingers got caught in the table saw. Bleeding controlled in triage. Pt states fingers are still intact. Pt A&Ox4. Unknown tetanus shot.

## 2022-10-05 NOTE — Discharge Instructions (Signed)
Follow up with your regular doctor as needed Follow up with [facility], please call on Monday morning.  Tell them you were seen in the ER and cut your fingers on saw.  Tell them we want you to see the hand specialist on Tuesday Take the antibiotic as prescribed Do not get the wounds wet, if you soak through the bandages you may redress your fingers

## 2022-10-08 DIAGNOSIS — S68123A Partial traumatic metacarpophalangeal amputation of left middle finger, initial encounter: Secondary | ICD-10-CM | POA: Diagnosis not present

## 2022-10-15 DIAGNOSIS — S68123A Partial traumatic metacarpophalangeal amputation of left middle finger, initial encounter: Secondary | ICD-10-CM | POA: Diagnosis not present

## 2022-11-12 DIAGNOSIS — S68123A Partial traumatic metacarpophalangeal amputation of left middle finger, initial encounter: Secondary | ICD-10-CM | POA: Diagnosis not present

## 2023-02-13 DIAGNOSIS — K269 Duodenal ulcer, unspecified as acute or chronic, without hemorrhage or perforation: Secondary | ICD-10-CM | POA: Diagnosis not present

## 2023-02-13 DIAGNOSIS — E782 Mixed hyperlipidemia: Secondary | ICD-10-CM | POA: Diagnosis not present

## 2023-02-13 DIAGNOSIS — I5032 Chronic diastolic (congestive) heart failure: Secondary | ICD-10-CM | POA: Diagnosis not present

## 2023-02-13 DIAGNOSIS — Z0000 Encounter for general adult medical examination without abnormal findings: Secondary | ICD-10-CM | POA: Diagnosis not present

## 2023-02-13 DIAGNOSIS — I110 Hypertensive heart disease with heart failure: Secondary | ICD-10-CM | POA: Diagnosis not present

## 2023-02-13 DIAGNOSIS — Z7901 Long term (current) use of anticoagulants: Secondary | ICD-10-CM | POA: Diagnosis not present

## 2023-02-13 DIAGNOSIS — I482 Chronic atrial fibrillation, unspecified: Secondary | ICD-10-CM | POA: Diagnosis not present

## 2023-02-13 DIAGNOSIS — E538 Deficiency of other specified B group vitamins: Secondary | ICD-10-CM | POA: Diagnosis not present

## 2023-02-13 DIAGNOSIS — E669 Obesity, unspecified: Secondary | ICD-10-CM | POA: Diagnosis not present

## 2023-02-13 DIAGNOSIS — Z79899 Other long term (current) drug therapy: Secondary | ICD-10-CM | POA: Diagnosis not present

## 2023-02-13 DIAGNOSIS — Z1331 Encounter for screening for depression: Secondary | ICD-10-CM | POA: Diagnosis not present

## 2023-02-24 DIAGNOSIS — E538 Deficiency of other specified B group vitamins: Secondary | ICD-10-CM | POA: Diagnosis not present

## 2023-03-03 DIAGNOSIS — E538 Deficiency of other specified B group vitamins: Secondary | ICD-10-CM | POA: Diagnosis not present

## 2023-03-10 DIAGNOSIS — E538 Deficiency of other specified B group vitamins: Secondary | ICD-10-CM | POA: Diagnosis not present

## 2023-03-17 DIAGNOSIS — E538 Deficiency of other specified B group vitamins: Secondary | ICD-10-CM | POA: Diagnosis not present

## 2023-04-17 DIAGNOSIS — E538 Deficiency of other specified B group vitamins: Secondary | ICD-10-CM | POA: Diagnosis not present

## 2023-04-18 DIAGNOSIS — U071 COVID-19: Secondary | ICD-10-CM | POA: Diagnosis not present

## 2023-04-18 DIAGNOSIS — N1831 Chronic kidney disease, stage 3a: Secondary | ICD-10-CM | POA: Diagnosis not present

## 2023-04-18 DIAGNOSIS — Z79899 Other long term (current) drug therapy: Secondary | ICD-10-CM | POA: Diagnosis not present

## 2023-05-19 DIAGNOSIS — E538 Deficiency of other specified B group vitamins: Secondary | ICD-10-CM | POA: Diagnosis not present

## 2023-06-18 DIAGNOSIS — E538 Deficiency of other specified B group vitamins: Secondary | ICD-10-CM | POA: Diagnosis not present

## 2023-07-21 ENCOUNTER — Ambulatory Visit: Payer: Medicare HMO | Admitting: Cardiovascular Disease

## 2023-08-13 DIAGNOSIS — I5032 Chronic diastolic (congestive) heart failure: Secondary | ICD-10-CM | POA: Diagnosis not present

## 2023-08-13 DIAGNOSIS — E538 Deficiency of other specified B group vitamins: Secondary | ICD-10-CM | POA: Diagnosis not present

## 2023-08-13 DIAGNOSIS — K269 Duodenal ulcer, unspecified as acute or chronic, without hemorrhage or perforation: Secondary | ICD-10-CM | POA: Diagnosis not present

## 2023-08-13 DIAGNOSIS — R2689 Other abnormalities of gait and mobility: Secondary | ICD-10-CM | POA: Diagnosis not present

## 2023-08-13 DIAGNOSIS — I10 Essential (primary) hypertension: Secondary | ICD-10-CM | POA: Diagnosis not present

## 2023-08-13 DIAGNOSIS — E782 Mixed hyperlipidemia: Secondary | ICD-10-CM | POA: Diagnosis not present

## 2023-08-13 DIAGNOSIS — I110 Hypertensive heart disease with heart failure: Secondary | ICD-10-CM | POA: Diagnosis not present

## 2023-08-13 DIAGNOSIS — K254 Chronic or unspecified gastric ulcer with hemorrhage: Secondary | ICD-10-CM | POA: Diagnosis not present

## 2023-08-13 DIAGNOSIS — D649 Anemia, unspecified: Secondary | ICD-10-CM | POA: Diagnosis not present

## 2023-08-13 DIAGNOSIS — R5381 Other malaise: Secondary | ICD-10-CM | POA: Diagnosis not present

## 2023-08-13 DIAGNOSIS — F325 Major depressive disorder, single episode, in full remission: Secondary | ICD-10-CM | POA: Diagnosis not present

## 2023-08-13 DIAGNOSIS — F1090 Alcohol use, unspecified, uncomplicated: Secondary | ICD-10-CM | POA: Diagnosis not present

## 2023-09-05 DIAGNOSIS — G8929 Other chronic pain: Secondary | ICD-10-CM | POA: Diagnosis not present

## 2023-09-05 DIAGNOSIS — M6281 Muscle weakness (generalized): Secondary | ICD-10-CM | POA: Diagnosis not present

## 2023-09-05 DIAGNOSIS — M545 Low back pain, unspecified: Secondary | ICD-10-CM | POA: Diagnosis not present

## 2023-09-05 DIAGNOSIS — R2689 Other abnormalities of gait and mobility: Secondary | ICD-10-CM | POA: Diagnosis not present

## 2023-09-10 DIAGNOSIS — M545 Low back pain, unspecified: Secondary | ICD-10-CM | POA: Diagnosis not present

## 2023-09-10 DIAGNOSIS — G8929 Other chronic pain: Secondary | ICD-10-CM | POA: Diagnosis not present

## 2023-09-10 DIAGNOSIS — R2689 Other abnormalities of gait and mobility: Secondary | ICD-10-CM | POA: Diagnosis not present

## 2023-09-10 DIAGNOSIS — M6281 Muscle weakness (generalized): Secondary | ICD-10-CM | POA: Diagnosis not present

## 2023-09-12 DIAGNOSIS — G8929 Other chronic pain: Secondary | ICD-10-CM | POA: Diagnosis not present

## 2023-09-12 DIAGNOSIS — M6281 Muscle weakness (generalized): Secondary | ICD-10-CM | POA: Diagnosis not present

## 2023-09-12 DIAGNOSIS — R2689 Other abnormalities of gait and mobility: Secondary | ICD-10-CM | POA: Diagnosis not present

## 2023-09-12 DIAGNOSIS — M545 Low back pain, unspecified: Secondary | ICD-10-CM | POA: Diagnosis not present

## 2023-09-16 DIAGNOSIS — M545 Low back pain, unspecified: Secondary | ICD-10-CM | POA: Diagnosis not present

## 2023-09-16 DIAGNOSIS — G8929 Other chronic pain: Secondary | ICD-10-CM | POA: Diagnosis not present

## 2023-09-16 DIAGNOSIS — R2689 Other abnormalities of gait and mobility: Secondary | ICD-10-CM | POA: Diagnosis not present

## 2023-09-16 DIAGNOSIS — M6281 Muscle weakness (generalized): Secondary | ICD-10-CM | POA: Diagnosis not present

## 2023-09-19 DIAGNOSIS — R2689 Other abnormalities of gait and mobility: Secondary | ICD-10-CM | POA: Diagnosis not present

## 2023-09-19 DIAGNOSIS — G8929 Other chronic pain: Secondary | ICD-10-CM | POA: Diagnosis not present

## 2023-09-19 DIAGNOSIS — M6281 Muscle weakness (generalized): Secondary | ICD-10-CM | POA: Diagnosis not present

## 2023-09-19 DIAGNOSIS — M545 Low back pain, unspecified: Secondary | ICD-10-CM | POA: Diagnosis not present

## 2024-01-13 ENCOUNTER — Observation Stay
Admission: EM | Admit: 2024-01-13 | Discharge: 2024-01-16 | Disposition: A | Discharge status: Home / Self Care | Attending: Internal Medicine | Admitting: Internal Medicine

## 2024-01-13 ENCOUNTER — Other Ambulatory Visit: Payer: Self-pay

## 2024-01-13 DIAGNOSIS — R42 Dizziness and giddiness: Secondary | ICD-10-CM | POA: Diagnosis not present

## 2024-01-13 DIAGNOSIS — I509 Heart failure, unspecified: Secondary | ICD-10-CM | POA: Diagnosis not present

## 2024-01-13 DIAGNOSIS — D509 Iron deficiency anemia, unspecified: Secondary | ICD-10-CM | POA: Diagnosis not present

## 2024-01-13 DIAGNOSIS — F324 Major depressive disorder, single episode, in partial remission: Secondary | ICD-10-CM | POA: Diagnosis present

## 2024-01-13 DIAGNOSIS — I10 Essential (primary) hypertension: Secondary | ICD-10-CM | POA: Diagnosis not present

## 2024-01-13 DIAGNOSIS — E785 Hyperlipidemia, unspecified: Secondary | ICD-10-CM | POA: Diagnosis present

## 2024-01-13 DIAGNOSIS — D649 Anemia, unspecified: Secondary | ICD-10-CM | POA: Diagnosis not present

## 2024-01-13 DIAGNOSIS — K920 Hematemesis: Secondary | ICD-10-CM | POA: Diagnosis not present

## 2024-01-13 DIAGNOSIS — K5730 Diverticulosis of large intestine without perforation or abscess without bleeding: Secondary | ICD-10-CM | POA: Insufficient documentation

## 2024-01-13 DIAGNOSIS — R2689 Other abnormalities of gait and mobility: Secondary | ICD-10-CM | POA: Diagnosis not present

## 2024-01-13 DIAGNOSIS — K922 Gastrointestinal hemorrhage, unspecified: Secondary | ICD-10-CM | POA: Diagnosis present

## 2024-01-13 DIAGNOSIS — Z79899 Other long term (current) drug therapy: Secondary | ICD-10-CM | POA: Diagnosis not present

## 2024-01-13 DIAGNOSIS — R531 Weakness: Secondary | ICD-10-CM | POA: Diagnosis present

## 2024-01-13 DIAGNOSIS — K640 First degree hemorrhoids: Secondary | ICD-10-CM | POA: Diagnosis not present

## 2024-01-13 DIAGNOSIS — K5731 Diverticulosis of large intestine without perforation or abscess with bleeding: Secondary | ICD-10-CM | POA: Insufficient documentation

## 2024-01-13 DIAGNOSIS — F1090 Alcohol use, unspecified, uncomplicated: Secondary | ICD-10-CM | POA: Diagnosis not present

## 2024-01-13 DIAGNOSIS — N179 Acute kidney failure, unspecified: Secondary | ICD-10-CM | POA: Diagnosis present

## 2024-01-13 DIAGNOSIS — K6389 Other specified diseases of intestine: Secondary | ICD-10-CM | POA: Diagnosis not present

## 2024-01-13 DIAGNOSIS — F1010 Alcohol abuse, uncomplicated: Secondary | ICD-10-CM | POA: Diagnosis not present

## 2024-01-13 DIAGNOSIS — I110 Hypertensive heart disease with heart failure: Secondary | ICD-10-CM | POA: Diagnosis not present

## 2024-01-13 DIAGNOSIS — I4811 Longstanding persistent atrial fibrillation: Secondary | ICD-10-CM | POA: Diagnosis not present

## 2024-01-13 DIAGNOSIS — C189 Malignant neoplasm of colon, unspecified: Secondary | ICD-10-CM | POA: Insufficient documentation

## 2024-01-13 DIAGNOSIS — I482 Chronic atrial fibrillation, unspecified: Secondary | ICD-10-CM | POA: Diagnosis not present

## 2024-01-13 DIAGNOSIS — K921 Melena: Secondary | ICD-10-CM

## 2024-01-13 DIAGNOSIS — D490 Neoplasm of unspecified behavior of digestive system: Secondary | ICD-10-CM

## 2024-01-13 HISTORY — DX: Anemia, unspecified: D64.9

## 2024-01-13 LAB — COMPREHENSIVE METABOLIC PANEL WITH GFR
ALT: 7 U/L (ref 0–44)
AST: 17 U/L (ref 15–41)
Albumin: 3.3 g/dL — ABNORMAL LOW (ref 3.5–5.0)
Alkaline Phosphatase: 42 U/L (ref 38–126)
Anion gap: 11 (ref 5–15)
BUN: 35 mg/dL — ABNORMAL HIGH (ref 8–23)
CO2: 22 mmol/L (ref 22–32)
Calcium: 8.7 mg/dL — ABNORMAL LOW (ref 8.9–10.3)
Chloride: 103 mmol/L (ref 98–111)
Creatinine, Ser: 1.75 mg/dL — ABNORMAL HIGH (ref 0.61–1.24)
GFR, Estimated: 38 mL/min — ABNORMAL LOW (ref >60)
Glucose, Bld: 134 mg/dL — ABNORMAL HIGH (ref 70–99)
Potassium: 3.8 mmol/L (ref 3.5–5.1)
Sodium: 136 mmol/L (ref 135–145)
Total Bilirubin: 1.6 mg/dL — ABNORMAL HIGH (ref 0.0–1.2)
Total Protein: 6.1 g/dL — ABNORMAL LOW (ref 6.5–8.1)

## 2024-01-13 LAB — CBC
HCT: 20.6 % — ABNORMAL LOW (ref 39.0–52.0)
Hemoglobin: 5.7 g/dL — ABNORMAL LOW (ref 13.0–17.0)
MCH: 20.2 pg — ABNORMAL LOW (ref 26.0–34.0)
MCHC: 27.7 g/dL — ABNORMAL LOW (ref 30.0–36.0)
MCV: 73 fL — ABNORMAL LOW (ref 80.0–100.0)
Platelets: 296 10*3/uL (ref 150–400)
RBC: 2.82 MIL/uL — ABNORMAL LOW (ref 4.22–5.81)
RDW: 20.5 % — ABNORMAL HIGH (ref 11.5–15.5)
WBC: 7 10*3/uL (ref 4.0–10.5)
nRBC: 0.3 % — ABNORMAL HIGH (ref 0.0–0.2)

## 2024-01-13 LAB — PREPARE RBC (CROSSMATCH)

## 2024-01-13 LAB — PROTIME-INR
INR: 3.2 — ABNORMAL HIGH (ref 0.8–1.2)
Prothrombin Time: 32.8 s — ABNORMAL HIGH (ref 11.4–15.2)

## 2024-01-13 LAB — HEMOGLOBIN AND HEMATOCRIT, BLOOD
HCT: 22.3 % — ABNORMAL LOW (ref 39.0–52.0)
HCT: 23.1 % — ABNORMAL LOW (ref 39.0–52.0)
Hemoglobin: 6.5 g/dL — ABNORMAL LOW (ref 13.0–17.0)
Hemoglobin: 7 g/dL — ABNORMAL LOW (ref 13.0–17.0)

## 2024-01-13 LAB — APTT: aPTT: 38 s — ABNORMAL HIGH (ref 24–36)

## 2024-01-13 MED ORDER — SENNOSIDES-DOCUSATE SODIUM 8.6-50 MG PO TABS: 1.0000 | ORAL_TABLET | Freq: Every evening | ORAL | Status: DC | PRN

## 2024-01-13 MED ORDER — ACETAMINOPHEN 325 MG PO TABS
650.0000 mg | ORAL_TABLET | Freq: Four times a day (QID) | ORAL | Status: DC | PRN
  Administered 2024-01-14: 650 mg via ORAL
  Filled 2024-01-13: qty 2

## 2024-01-13 MED ORDER — ONDANSETRON HCL 4 MG/2ML IJ SOLN: 4.0000 mg | Freq: Four times a day (QID) | INTRAMUSCULAR | Status: DC | PRN

## 2024-01-13 MED ORDER — PANTOPRAZOLE SODIUM 40 MG PO TBEC
40.0000 mg | DELAYED_RELEASE_TABLET | Freq: Two times a day (BID) | ORAL | Status: DC
Start: 2024-01-13 — End: 2024-01-13
  Administered 2024-01-13: 40 mg via ORAL
  Filled 2024-01-13: qty 1

## 2024-01-13 MED ORDER — ENSURE ENLIVE PO LIQD
237.0000 mL | Freq: Two times a day (BID) | ORAL | Status: DC
  Administered 2024-01-16: 237 mL via ORAL

## 2024-01-13 MED ORDER — THIAMINE HCL 100 MG/ML IJ SOLN: 100.0000 mg | Freq: Every day | INTRAMUSCULAR | Status: DC

## 2024-01-13 MED ORDER — ADULT MULTIVITAMIN W/MINERALS CH
1.0000 | ORAL_TABLET | Freq: Every day | ORAL | Status: DC
  Administered 2024-01-13 – 2024-01-16 (×2): 1 via ORAL
  Filled 2024-01-13 (×3): qty 1

## 2024-01-13 MED ORDER — LORAZEPAM 1 MG PO TABS: 1.0000 mg | ORAL_TABLET | ORAL | Status: DC | PRN

## 2024-01-13 MED ORDER — SODIUM CHLORIDE 0.9 % IV SOLN: 10.0000 mL/h | Freq: Once | INTRAVENOUS | Status: AC

## 2024-01-13 MED ORDER — HYDRALAZINE HCL 20 MG/ML IJ SOLN: 5.0000 mg | Freq: Four times a day (QID) | INTRAMUSCULAR | Status: DC | PRN

## 2024-01-13 MED ORDER — ACETAMINOPHEN 650 MG RE SUPP: 650.0000 mg | Freq: Four times a day (QID) | RECTAL | Status: DC | PRN

## 2024-01-13 MED ORDER — SODIUM CHLORIDE 0.9% IV SOLUTION: Freq: Once | INTRAVENOUS | Status: AC

## 2024-01-13 MED ORDER — FOLIC ACID 1 MG PO TABS
1.0000 mg | ORAL_TABLET | Freq: Every day | ORAL | Status: DC
  Administered 2024-01-13 – 2024-01-16 (×2): 1 mg via ORAL
  Filled 2024-01-13 (×3): qty 1

## 2024-01-13 MED ORDER — FOLIC ACID 1 MG PO TABS: 1.0000 mg | ORAL_TABLET | Freq: Every day | ORAL | Status: DC

## 2024-01-13 MED ORDER — METOPROLOL TARTRATE 25 MG PO TABS
25.0000 mg | ORAL_TABLET | Freq: Two times a day (BID) | ORAL | Status: DC
  Administered 2024-01-14 – 2024-01-16 (×3): 25 mg via ORAL
  Filled 2024-01-13 (×4): qty 1

## 2024-01-13 MED ORDER — THIAMINE MONONITRATE 100 MG PO TABS
100.0000 mg | ORAL_TABLET | Freq: Every day | ORAL | Status: DC
  Administered 2024-01-13 – 2024-01-16 (×2): 100 mg via ORAL
  Filled 2024-01-13 (×3): qty 1

## 2024-01-13 MED ORDER — VITAMIN B-12 1000 MCG PO TABS
1000.0000 ug | ORAL_TABLET | Freq: Every day | ORAL | Status: DC
  Administered 2024-01-16: 1000 ug via ORAL
  Filled 2024-01-13 (×2): qty 1

## 2024-01-13 MED ORDER — ONDANSETRON HCL 4 MG PO TABS: 4.0000 mg | ORAL_TABLET | Freq: Four times a day (QID) | ORAL | Status: DC | PRN

## 2024-01-13 MED ORDER — PANTOPRAZOLE SODIUM 40 MG IV SOLR
80.0000 mg | Freq: Once | INTRAVENOUS | Status: AC
  Administered 2024-01-13: 80 mg via INTRAVENOUS
  Filled 2024-01-13: qty 20

## 2024-01-13 MED ORDER — PANTOPRAZOLE SODIUM 40 MG IV SOLR
40.0000 mg | Freq: Two times a day (BID) | INTRAVENOUS | Status: DC
  Administered 2024-01-13 – 2024-01-16 (×5): 40 mg via INTRAVENOUS
  Filled 2024-01-13 (×5): qty 10

## 2024-01-13 MED ORDER — LORAZEPAM 2 MG/ML IJ SOLN: 1.0000 mg | INTRAMUSCULAR | Status: DC | PRN

## 2024-01-13 NOTE — Assessment & Plan Note (Signed)
 AM team to resume home atorvastatin  when the benefits outweigh the risk

## 2024-01-13 NOTE — Hospital Course (Signed)
 [name] is an 83 year old male with history of physical deconditioning, B12 deficiency, chronic alcohol  use, iron deficiency anemia, chronic anemia, who presents emergency department for chief concerns of abnormal labs, and generalized weakness.  Vitals in the ED showed t of 97.8, rr 27, heart rate 120, blood pressure 117/71, SpO2 96% on room air.  Serum sodium is 136, potassium 3.8, chloride 103, bicarb 22, BUN of 35, serum creatinine 1.75, EGFR 38, T. bili was elevated at 1.6.  WBC 7.0, hemoglobin 5.7, platelets of 296.  ED treatment: Patient has been typed and screened.  1 unit of PRBC have been ordered for transfusion.

## 2024-01-13 NOTE — Assessment & Plan Note (Signed)
 Suspect secondary to blood loss anemia Status post blood transfusion Recheck BMP in a.m.

## 2024-01-13 NOTE — Assessment & Plan Note (Signed)
 CIWA precaution initiated Patient's last drink was yesterday Protonix  80 mg IV one-time dose

## 2024-01-13 NOTE — Plan of Care (Signed)

## 2024-01-13 NOTE — ED Triage Notes (Addendum)
 Pt here with weakness and a hgb of 5.6. Pt is pale in color. Pt states he has been feeling weak for a long time.

## 2024-01-13 NOTE — Plan of Care (Signed)
  Problem: Education: Goal: Knowledge of General Education information will improve Description: Including pain rating scale, medication(s)/side effects and non-pharmacologic comfort measures Outcome: Progressing   Problem: Clinical Measurements: Goal: Diagnostic test results will improve Outcome: Progressing   Problem: Nutrition: Goal: Adequate nutrition will be maintained Outcome: Progressing   Problem: Coping: Goal: Level of anxiety will decrease Outcome: Progressing   Problem: Elimination: Goal: Will not experience complications related to bowel motility Outcome: Progressing Goal: Will not experience complications related to urinary retention Outcome: Progressing   Problem: Pain Managment: Goal: General experience of comfort will improve and/or be controlled Outcome: Progressing

## 2024-01-13 NOTE — H&P (Addendum)
 History and Physical   Michael Rowe ZOX:[id number] DOB: 05-Oct-1940 DOA: 01/13/2024  PCP: Michael Fells, NP  Patient coming from: [facility] [place]  I have personally briefly reviewed patient's old medical records in [facility] EMR.  Chief Concern: Abnormal lab  HPI: [name] is an 83 year old male with history of physical deconditioning, B12 deficiency, chronic alcohol  use, iron deficiency anemia, chronic anemia, who presents emergency department for chief concerns of abnormal labs, and generalized weakness.  Vitals in the ED showed t of 97.8, rr 27, heart rate 120, blood pressure 117/71, SpO2 96% on room air.  Serum sodium is 136, potassium 3.8, chloride 103, bicarb 22, BUN of 35, serum creatinine 1.75, EGFR 38, T. bili was elevated at 1.6.  WBC 7.0, hemoglobin 5.7, platelets of 296.  ED treatment: Patient has been typed and screened.  1 unit of PRBC have been ordered for transfusion. ---------------------------------- At bedside, patient was able to tell me his name, age, location, current calendar year.  He reports that he has been having weakness, fatigue, shortness of breath with exertion for many many months however it has been worsening over the last 1 to 2 weeks.  He reports that his stool is usually brown however sometimes it is black and sometimes it has streaks of blood.  He denies nausea and vomiting.  He denies trauma to his person.  He denies over-the-counter NSAID use including BC powder, Goody powder, ibuprofen, Motrin.  He reports he drinks alcohol  daily.  He reports he drinks about 2-3 or 4 shots of vodka every night.  His last drink was yesterday.  Social history: He lives at home.  He denies tobacco and recreational drug use.  He reports daily alcohol  use.  He is retired and formally worked  ROS: Constitutional: no weight change, no fever ENT/Mouth: no sore throat, no rhinorrhea Eyes: no eye pain, no vision changes Cardiovascular: no  chest pain, no dyspnea,  no edema, no palpitations Respiratory: no cough, no sputum, no wheezing Gastrointestinal: no nausea, no vomiting, no diarrhea, no constipation Genitourinary: no urinary incontinence, no dysuria, no hematuria Musculoskeletal: no arthralgias, no myalgias Skin: no skin lesions, no pruritus, Neuro: + weakness, no loss of consciousness, no syncope Psych: no anxiety, no depression, no decrease appetite Heme/Lymph: no bruising, no bleeding  ED Course: Discussed with EDP, patient requiring hospitalization for chief concerns of symptomatic anemia.  Assessment/Plan  Principal Problem:   Symptomatic anemia Active Problems:   AKI (acute kidney injury) (HCC)   HTN (hypertension)   Depression, major, single episode, in partial remission (HCC)   HLD (hyperlipidemia)   Alcohol  abuse   Assessment and Plan:  * Symptomatic anemia Suspect upper GI bleed in setting of alcohol  abuse with likely dependence Last vodka shots were yesterday evening Hemoglobin is 5.7 Patient is status type and screen 1 unit PRBC ordered for transfusion Nursing instruction: 2 please obtain repeat hemoglobin/hematocrit after transfusion of 1 unit PRBC 2 peripheral IV: Please ensure and maintain 2 peripheral IV (prefer large bore) in patient with suspected upper GI bleed Home Xarelto  will be held on admission Protonix  80 mg IV one-time dose ordered on admission Protonix  40 mg IV twice daily initiated on admission Gastroenterology service has been consulted Clear liquid diet ordered on admission Goal hemoglobin > 8  Addendum at 18:19: Repeat hemoglobin hematocrit revealed hemoglobin of 6.5/hematocrit 22.3.  1 unit of PRBC ordered for transfusion.  Recheck CBC in AM. Discussed with RN.  AKI (acute kidney injury) (HCC) Suspect  secondary to blood loss anemia Status post blood transfusion Recheck BMP in a.m.  HTN (hypertension) Home metoprolol  tartrate 25 mg p.o. twice daily resumed Hydralazine  5 mg IV every 6 hours as needed for SBP > 165, 5 days ordered  Alcohol  abuse CIWA precaution initiated Patient's last drink was yesterday Protonix  80 mg IV one-time dose  HLD (hyperlipidemia) AM team to resume home atorvastatin  when the benefits outweigh the risk  Chart reviewed.   DVT prophylaxis: TED hose; AM team to resume home Xarelto  when benefits outweigh the risk Code Status: Full code Diet: Clear liquid diet Family Communication: No Disposition Plan: Pending clinical course Consults called: Gastroenterology Admission status: Telemetry medical, inpatient  Past Medical History:  Diagnosis Date   AKI (acute kidney injury) (HCC) 09/30/2017   Alcohol  abuse    Atrial fibrillation (HCC)    a. patient reports being diagnosed with A fib ~ 15-20 years prior in [place], [place]; b. noted to be in Afib 1/19; c. CHADS2VASc => 4 (HTN, age x 2, vascular dosease)   CHF (congestive heart failure) (HCC)    HLD (hyperlipidemia)    Hypertension    Macrocytic anemia    Morbid obesity (HCC)    Past Surgical History:  Procedure Laterality Date   CARDIOVERSION N/A 11/14/2017   Procedure: CARDIOVERSION;  Surgeon: Devorah Fonder, MD;  Location: [facility] ORS;  Service: Cardiovascular;  Laterality: N/A;   ESOPHAGOGASTRODUODENOSCOPY (EGD) WITH PROPOFOL  N/A 02/28/2022   Procedure: ESOPHAGOGASTRODUODENOSCOPY (EGD) WITH PROPOFOL ;  Surgeon: Shane Darling, MD;  Location: [facility] ENDOSCOPY;  Service: Endoscopy;  Laterality: N/A;   KNEE SURGERY     Social History:  reports that he has never smoked. He has never used smokeless tobacco. He reports current alcohol  use of about 4.0 - 5.0 standard drinks of alcohol  per week. He reports that he does not use drugs.  No Known Allergies Family History  Problem Relation Age of Onset   Liver disease Mother    Heart disease Father    CAD Brother    Cancer Brother    Cancer Brother    Cancer Brother    Family history: Family history reviewed and not  pertinent.  Prior to Admission medications   Medication Sig Start Date End Date Taking? Authorizing Provider  atorvastatin  (LIPITOR) 40 MG tablet Take 1 tablet (40 mg total) by mouth daily. Please call for an appointment for more refills 05/19/19   Terre Ferri P, DO  folic acid  (FOLVITE ) 1 MG tablet Take 1 mg by mouth daily. 04/30/21   [provider]  furosemide  (LASIX ) 20 MG tablet Take 1 tablet (20 mg total) by mouth daily as needed (swelling). 03/02/22   Montey Apa, DO  metoprolol  tartrate (LOPRESSOR ) 25 MG tablet Take 1 tablet (25 mg total) by mouth 2 (two) times daily. 03/02/22   Montey Apa, DO  pantoprazole  (PROTONIX ) 40 MG tablet Take 1 tablet (40 mg total) by mouth 2 (two) times daily. 03/02/22 05/01/22  Montey Apa, DO    Physical Exam: Vitals:   01/13/24 1500 01/13/24 1536 01/13/24 1547 01/13/24 1645  BP: 121/67 127/70  109/65  Pulse: 89 98  93  Resp: 18 16    Temp:  98.2 F (36.8 C)  99 F (37.2 C)  TempSrc:  Oral  Oral  SpO2: 100% 99%  100%  Height:   5\' 11"  (1.803 m)    Constitutional: appears age-appropriate, frail Eyes: PERRL, lids and conjunctivae normal ENMT: Mucous membranes are moist. Posterior pharynx  clear of any exudate or lesions. Age-appropriate dentition. Hearing appropriate Neck: normal, supple, no masses, no thyromegaly Respiratory: clear to auscultation bilaterally, no wheezing, no crackles. Normal respiratory effort. No accessory muscle use.  Cardiovascular: Regular rate and rhythm, no murmurs / rubs / gallops. No extremity edema. 2+ pedal pulses. No carotid bruits.  Abdomen: Obese abdomen, no tenderness, no masses palpated, no hepatosplenomegaly. Bowel sounds positive.  Musculoskeletal: no clubbing / cyanosis. No joint deformity upper and lower extremities. Good ROM, no contractures, no atrophy. Normal muscle tone.  Skin: Pale skin, no rashes, lesions, ulcers. No induration Neurologic: Sensation intact. Strength 5/5 in all 4.   Psychiatric: Normal judgment and insight. Alert and oriented x 3. Normal mood.   EKG: independently reviewed, showing atrial fibrillation with rate of 105, QTc 463  Chest x-ray on Admission: Not indicated at this time  Labs on Admission: I have personally reviewed following labs CBC: Recent Labs  Lab 01/13/24 1255 01/13/24 1737  WBC 7.0  --   HGB 5.7* 6.5*  HCT 20.6* 22.3*  MCV 73.0*  --   PLT 296  --    Basic Metabolic Panel: Recent Labs  Lab 01/13/24 1255  NA 136  K 3.8  CL 103  CO2 22  GLUCOSE 134*  BUN 35*  CREATININE 1.75*  CALCIUM  8.7*   GFR: CrCl cannot be calculated (Unknown ideal weight.).  Liver Function Tests: Recent Labs  Lab 01/13/24 1255  AST 17  ALT 7  ALKPHOS 42  BILITOT 1.6*  PROT 6.1*  ALBUMIN 3.3*   Coagulation Profile: Recent Labs  Lab 01/13/24 1255  INR 3.2*   Urine analysis:    Component Value Date/Time   COLORURINE YELLOW (A) 02/27/2022 2228   APPEARANCEUR CLEAR (A) 02/27/2022 2228   APPEARANCEUR Clear 11/06/2018 1447   LABSPEC 1.041 (H) 02/27/2022 2228   PHURINE 5.0 02/27/2022 2228   GLUCOSEU NEGATIVE 02/27/2022 2228   HGBUR NEGATIVE 02/27/2022 2228   BILIRUBINUR NEGATIVE 02/27/2022 2228   BILIRUBINUR Negative 11/06/2018 1447   KETONESUR NEGATIVE 02/27/2022 2228   PROTEINUR NEGATIVE 02/27/2022 2228   NITRITE NEGATIVE 02/27/2022 2228   LEUKOCYTESUR NEGATIVE 02/27/2022 2228   CRITICAL CARE Performed by: Dr. Reinhold Carbine  Total critical care time: 32 minutes  Critical care time was exclusive of separately billable procedures and treating other patients.  Critical care was necessary to treat or prevent imminent or life-threatening deterioration.  Critical care was time spent personally by me on the following activities: development of treatment plan with patient as well as nursing, discussions with consultants, evaluation of patient's response to treatment, examination of patient, obtaining history from patient or surrogate,  ordering and performing treatments and interventions, ordering and review of laboratory studies, ordering and review of radiographic studies, pulse oximetry and re-evaluation of patient's condition.  This document was prepared using Dragon Voice Recognition software and may include unintentional dictation errors.  Dr. Reinhold Carbine [facility]  If 7PM-7AM, please contact overnight-coverage provider If 7AM-7PM, please contact day attending provider [email or web address]  01/13/2024, 6:20 PM

## 2024-01-13 NOTE — Assessment & Plan Note (Addendum)
 Suspect upper GI bleed in setting of alcohol  abuse with likely dependence Last vodka shots were yesterday evening Hemoglobin is 5.7 Patient is status type and screen 1 unit PRBC ordered for transfusion Nursing instruction: 2 please obtain repeat hemoglobin/hematocrit after transfusion of 1 unit PRBC 2 peripheral IV: Please ensure and maintain 2 peripheral IV (prefer large bore) in patient with suspected upper GI bleed Home Xarelto  will be held on admission Protonix  80 mg IV one-time dose ordered on admission Protonix  40 mg IV twice daily initiated on admission Gastroenterology service has been consulted Clear liquid diet ordered on admission Goal hemoglobin > 8  Addendum at 18:19: Repeat hemoglobin hematocrit revealed hemoglobin of 6.5/hematocrit 22.3.  1 unit of PRBC ordered for transfusion.  Recheck CBC in AM. Discussed with RN.

## 2024-01-13 NOTE — Assessment & Plan Note (Addendum)
 Home metoprolol  tartrate 25 mg p.o. twice daily resumed Hydralazine 5 mg IV every 6 hours as needed for SBP > 165, 5 days ordered

## 2024-01-13 NOTE — ED Notes (Signed)
 Pt was brought back to room, helped pt undress and change into hospital gown.  During movement for this pt became tachy and had afib with RVR to 170 but once resting his HR slowed back to 107 (afib).  Pt states that he has been very activity intolerant.  Pt is on xarelto .

## 2024-01-13 NOTE — ED Triage Notes (Signed)
 Arrives from [facility], patient Hgb 5.3 per report. Patient also c/o orthostatic hypotension.  Arrives AAOx3.  Skin pale, warm and dry. No SOB/ DOE.

## 2024-01-13 NOTE — ED Provider Notes (Signed)
 [facility] Provider Note    Event Date/Time   First MD Initiated Contact with Patient 01/13/24 1254     (approximate)   History   Dizziness   HPI  Michael Rowe is a 83 y.o. male who presents with complaints of dizziness and reports of low hemoglobin.  Patient reports he was called by his PCP and notified of hemoglobin that was low.  He reports in the past he has been told he needs iron supplementation.  Review of records demonstrates patient baseline hemoglobin seems to be between 8 and 9.  He does report seeing blood in his stools intermittently, he is on Xarelto       Physical Exam   Triage Vital Signs: ED Triage Vitals [01/13/24 1246]  Encounter Vitals Group     BP 117/71     Systolic BP Percentile      Diastolic BP Percentile      Pulse Rate (!) 120     Resp 16     Temp 97.8 F (36.6 C)     Temp Source Oral     SpO2 96 %     Weight      Height      Head Circumference      Peak Flow      Pain Score 0     Pain Loc      Pain Education      Exclude from Growth Chart     Most recent vital signs: Vitals:   01/13/24 1441 01/13/24 1500  BP: (!) 109/57 121/67  Pulse: (!) 106 89  Resp: 16 18  Temp: 97.9 F (36.6 C)   SpO2:  100%     General: Awake, no distress.  CV:  Good peripheral perfusion.  Resp:  Normal effort.  Abd:  No distention.  Other:     ED Results / Procedures / Treatments   Labs (all labs ordered are listed, but only abnormal results are displayed) Labs Reviewed  CBC - Abnormal; Notable for the following components:      Result Value   RBC 2.82 (*)    Hemoglobin 5.7 (*)    HCT 20.6 (*)    MCV 73.0 (*)    MCH 20.2 (*)    MCHC 27.7 (*)    RDW 20.5 (*)    nRBC 0.3 (*)    All other components within normal limits  COMPREHENSIVE METABOLIC PANEL WITH GFR - Abnormal; Notable for the following components:   Glucose, Bld 134 (*)    BUN 35 (*)    Creatinine, Ser 1.75 (*)    Calcium  8.7 (*)    Total  Protein 6.1 (*)    Albumin 3.3 (*)    Total Bilirubin 1.6 (*)    GFR, Estimated 38 (*)    All other components within normal limits  APTT - Abnormal; Notable for the following components:   aPTT 38 (*)    All other components within normal limits  PROTIME-INR - Abnormal; Notable for the following components:   Prothrombin Time 32.8 (*)    INR 3.2 (*)    All other components within normal limits  TYPE AND SCREEN  PREPARE RBC (CROSSMATCH)     EKG  ED ECG REPORT I, Bryson Carbine, the attending physician, personally viewed and interpreted this ECG.  Date: 01/13/2024  Rhythm: Atrial fibrillation QRS Axis: normal Intervals: Abnormal ST/T Wave abnormalities: normal Narrative Interpretation: no evidence of acute ischemia    RADIOLOGY  PROCEDURES:  Critical Care performed: yes  CRITICAL CARE Performed by: Bryson Carbine   Total critical care time: 30 minutes  Critical care time was exclusive of separately billable procedures and treating other patients.  Critical care was necessary to treat or prevent imminent or life-threatening deterioration.  Critical care was time spent personally by me on the following activities: development of treatment plan with patient and/or surrogate as well as nursing, discussions with consultants, evaluation of patient's response to treatment, examination of patient, obtaining history from patient or surrogate, ordering and performing treatments and interventions, ordering and review of laboratory studies, ordering and review of radiographic studies, pulse oximetry and re-evaluation of patient's condition.   Procedures   MEDICATIONS ORDERED IN ED: Medications  0.9 %  sodium chloride  infusion (has no administration in time range)  pantoprazole  (PROTONIX ) EC tablet 40 mg (40 mg Oral Given 01/13/24 1448)  acetaminophen  (TYLENOL ) tablet 650 mg (has no administration in time range)    Or  acetaminophen  (TYLENOL ) suppository 650 mg (has  no administration in time range)  ondansetron  (ZOFRAN ) tablet 4 mg (has no administration in time range)    Or  ondansetron  (ZOFRAN ) injection 4 mg (has no administration in time range)  senna-docusate (Senokot-S) tablet 1 tablet (has no administration in time range)     IMPRESSION / MDM / ASSESSMENT AND PLAN / ED COURSE  I reviewed the triage vital signs and the nursing notes. Patient's presentation is most consistent with acute presentation with potential threat to life or bodily function.  Patient presents with reports of low hemoglobin as detailed above, he is on Xarelto .  Blood pressure is stable, heart rate is reassuring  Lab confirms hemoglobin of 5.7, MCV 73, however does report issues of bloody stools intermittently.  Will transfuse 2 units PRBCs, patient will require admission to the hospitalist team for further evaluation of possible GI bleed.        FINAL CLINICAL IMPRESSION(S) / ED DIAGNOSES   Final diagnoses:  Symptomatic anemia  Gastrointestinal hemorrhage, unspecified gastrointestinal hemorrhage type     Rx / DC Orders   ED Discharge Orders     None        Note:  This document was prepared using Dragon voice recognition software and may include unintentional dictation errors.   Bryson Carbine, MD 01/13/24 [phone]

## 2024-01-14 ENCOUNTER — Inpatient Hospital Stay: Admitting: Anesthesiology

## 2024-01-14 ENCOUNTER — Encounter
Admission: EM | Disposition: A | Discharge status: Home / Self Care | Payer: Self-pay | Source: Home / Self Care | Attending: Emergency Medicine

## 2024-01-14 ENCOUNTER — Encounter: Payer: Self-pay | Admitting: Internal Medicine

## 2024-01-14 DIAGNOSIS — D649 Anemia, unspecified: Secondary | ICD-10-CM | POA: Diagnosis not present

## 2024-01-14 DIAGNOSIS — I4891 Unspecified atrial fibrillation: Secondary | ICD-10-CM | POA: Diagnosis not present

## 2024-01-14 DIAGNOSIS — K449 Diaphragmatic hernia without obstruction or gangrene: Secondary | ICD-10-CM

## 2024-01-14 DIAGNOSIS — K922 Gastrointestinal hemorrhage, unspecified: Secondary | ICD-10-CM

## 2024-01-14 HISTORY — PX: ESOPHAGOGASTRODUODENOSCOPY: SHX5428

## 2024-01-14 HISTORY — DX: Gastrointestinal hemorrhage, unspecified: K92.2

## 2024-01-14 LAB — TYPE AND SCREEN
ABO/RH(D): A POS
Antibody Screen: NEGATIVE
Unit division: 0
Unit division: 0

## 2024-01-14 LAB — CBC
HCT: 23.7 % — ABNORMAL LOW (ref 39.0–52.0)
Hemoglobin: 7.2 g/dL — ABNORMAL LOW (ref 13.0–17.0)
MCH: 22.5 pg — ABNORMAL LOW (ref 26.0–34.0)
MCHC: 30.4 g/dL (ref 30.0–36.0)
MCV: 74.1 fL — ABNORMAL LOW (ref 80.0–100.0)
Platelets: 200 10*3/uL (ref 150–400)
RBC: 3.2 MIL/uL — ABNORMAL LOW (ref 4.22–5.81)
RDW: 19.9 % — ABNORMAL HIGH (ref 11.5–15.5)
WBC: 5.5 10*3/uL (ref 4.0–10.5)
nRBC: 0 % (ref 0.0–0.2)

## 2024-01-14 LAB — BASIC METABOLIC PANEL WITH GFR
Anion gap: 4 — ABNORMAL LOW (ref 5–15)
BUN: 28 mg/dL — ABNORMAL HIGH (ref 8–23)
CO2: 25 mmol/L (ref 22–32)
Calcium: 8 mg/dL — ABNORMAL LOW (ref 8.9–10.3)
Chloride: 108 mmol/L (ref 98–111)
Creatinine, Ser: 1.61 mg/dL — ABNORMAL HIGH (ref 0.61–1.24)
GFR, Estimated: 42 mL/min — ABNORMAL LOW (ref >60)
Glucose, Bld: 98 mg/dL (ref 70–99)
Potassium: 3.4 mmol/L — ABNORMAL LOW (ref 3.5–5.1)
Sodium: 137 mmol/L (ref 135–145)

## 2024-01-14 LAB — BPAM RBC
Blood Product Expiration Date: 202506112359
Blood Product Expiration Date: 202506142359
ISSUE DATE / TIME: 202505131418
ISSUE DATE / TIME: 202505131838
Unit Type and Rh: 6200
Unit Type and Rh: 6200

## 2024-01-14 LAB — MAGNESIUM: Magnesium: 1.5 mg/dL — ABNORMAL LOW (ref 1.7–2.4)

## 2024-01-14 SURGERY — EGD (ESOPHAGOGASTRODUODENOSCOPY)
Anesthesia: General

## 2024-01-14 MED ORDER — POTASSIUM CHLORIDE CRYS ER 20 MEQ PO TBCR
40.0000 meq | EXTENDED_RELEASE_TABLET | Freq: Once | ORAL | Status: AC
  Administered 2024-01-14: 40 meq via ORAL
  Filled 2024-01-14: qty 2

## 2024-01-14 MED ORDER — SODIUM CHLORIDE 0.9 % IV SOLN: Freq: Once | INTRAVENOUS | Status: AC

## 2024-01-14 MED ORDER — METOPROLOL TARTRATE 5 MG/5ML IV SOLN: 2.5000 mg | Freq: Four times a day (QID) | INTRAVENOUS | Status: DC

## 2024-01-14 MED ORDER — PEG 3350-KCL-NA BICARB-NACL 420 G PO SOLR
4000.0000 mL | Freq: Once | ORAL | Status: AC
  Administered 2024-01-14: 4000 mL via ORAL
  Filled 2024-01-14: qty 4000

## 2024-01-14 MED ORDER — METOPROLOL TARTRATE 5 MG/5ML IV SOLN: 2.5000 mg | Freq: Four times a day (QID) | INTRAVENOUS | Status: DC | PRN

## 2024-01-14 MED ORDER — PROPOFOL 10 MG/ML IV BOLUS
INTRAVENOUS | Status: DC | PRN
  Administered 2024-01-14: 100 mg via INTRAVENOUS

## 2024-01-14 MED ORDER — MAGNESIUM SULFATE 2 GM/50ML IV SOLN
2.0000 g | Freq: Once | INTRAVENOUS | Status: AC
  Administered 2024-01-14: 2 g via INTRAVENOUS
  Filled 2024-01-14: qty 50

## 2024-01-14 MED ORDER — LIDOCAINE HCL (CARDIAC) PF 100 MG/5ML IV SOSY
PREFILLED_SYRINGE | INTRAVENOUS | Status: DC | PRN
  Administered 2024-01-14: 100 mg via INTRAVENOUS

## 2024-01-14 MED ORDER — PROPOFOL 10 MG/ML IV BOLUS
INTRAVENOUS | Status: AC
  Filled 2024-01-14: qty 20

## 2024-01-14 NOTE — Op Note (Signed)
 [facility]enter[facility] Patient Name: Michael Rowe Procedure Date: 01/14/2024 1:52 PM MRN: [id number] Account #: [id number] Date of Birth: 06/19/1941 Admit Type: Inpatient Age: 83 Room: [facility] ENDO ROOM 4 Gender: Male Note Status: Finalized Instrument Name: Upper Endoscope [id number] Procedure:             Upper GI endoscopy Indications:           Hematemesis Providers:             [name] MD, MD Referring MD:          Samual Crochet (Referring MD) Medicines:             Propofol  per Anesthesia Complications:         No immediate complications. Procedure:             Pre-Anesthesia Assessment:                        - Prior to the procedure, a History and Physical was                         performed, and patient medications and allergies were                         reviewed. The patient's tolerance of previous                         anesthesia was also reviewed. The risks and benefits                         of the procedure and the sedation options and risks                         were discussed with the patient. All questions were                         answered, and informed consent was obtained. Prior                         Anticoagulants: The patient has taken no anticoagulant                         or antiplatelet agents. ASA Grade Assessment: II - A                         patient with mild systemic disease. After reviewing                         the risks and benefits, the patient was deemed in                         satisfactory condition to undergo the procedure.                        After obtaining informed consent, the endoscope was                         passed under direct vision. Throughout the procedure,  the patient's blood pressure, pulse, and oxygen                         saturations were monitored continuously. The Endoscope                         was introduced through the mouth, and advanced to the                          second part of duodenum. The upper GI endoscopy was                         accomplished without difficulty. The patient tolerated                         the procedure well. Findings:      A hiatal hernia was present.      The stomach was normal.      The examined duodenum was normal.      Fresh blood in the mouth. Impression:            - Hiatal hernia.                        - Normal stomach.                        - Normal examined duodenum.                        - Fresh blood in the mouth.                        - No specimens collected. Recommendation:        - Return patient to hospital ward for ongoing care.                        - Resume previous diet.                        - Continue present medications. Procedure Code(s):     --- Professional ---                        [phone], Esophagogastroduodenoscopy, flexible,                         transoral; diagnostic, including collection of                         specimen(s) by brushing or washing, when performed                         (separate procedure) Diagnosis Code(s):     --- Professional ---                        K92.0, Hematemesis CPT copyright 2022 American Medical Association. All rights reserved. The codes documented in this report are preliminary and upon coder review may  be revised to meet current compliance requirements. [name] MD, MD 01/14/2024 2:07:06 PM This report has been signed electronically. Number of Addenda: 0 Note Initiated  On: 01/14/2024 1:52 PM Estimated Blood Loss:  Estimated blood loss: none.      [facility]

## 2024-01-14 NOTE — Care Management CC44 (Signed)
 Condition Code 44 Documentation Completed  Patient Details  Name: Nyle Olesh MRN: [id number] Date of Birth: [birth year]   Condition Code 44 given:  Yes Patient signature on Condition Code 44 notice:  Yes Documentation of 2 MD's agreement:  Yes Code 44 added to claim:  Yes    Alyas Creary A Vanshika Jastrzebski, RN 01/14/2024, 4:28 PM

## 2024-01-14 NOTE — Consult Note (Signed)
 [name], MD [facility]  [address], [address] [place], [place] [address] Phone: [phone] Fax : [phone]  Consultation  Referring Provider:     Dr. Reinhold Carbine Primary Care Physician:  Deliah Fells, NP Primary Gastroenterologist:  Dr. Emerick Hanlon         Reason for Consultation:     GI bleed  Date of Admission:  01/13/2024 Date of Consultation:  01/14/2024         HPI:   Michael Rowe is a 83 y.o. male with a history of peptic ulcer disease.  The patient had an upper endoscopy back in June 2023 with a ulcer found at that time.  The patient was supposed to have a repeat upper endoscopy in 3 months but did not have that done.  The patient now comes in with a history of intermittent black stools.  He reports it to be every few days.  The patient also reports that he continues to drink 2-3 shots per day and has been doing that since he was old enough to drink.  The patient has a history of B12 deficiency with a history of iron deficiency anemia and had labs that showed his hemoglobin of 5.7 with T. bili of 1.6.  The patient's hemoglobin and hematocrit have shown:  Component     Latest Ref Rng 03/02/2022 01/13/2024 01/14/2024  Hemoglobin     13.0 - 17.0 g/dL 9.0 (L)  7.0 (L)  7.2 (L)   Hemoglobin      7.9 (L)  6.5 (L)    Hemoglobin       5.7 (L)    HCT     39.0 - 52.0 % 27.0 (L)  23.1 (L)  23.7 (L)   HCT      24.0 (L)  22.3 (L)    HCT       20.6 (L)     The patient's INR is also prolonged at 3.2 with his INR of 2.6 one year ago.  The patient has been on Xarelto  which was held on admission.  He was also started on a PPI.  The patient was also transfused overnight. GI is now being consulted for a suspected upper GI bleed and for consideration of a upper endoscopy.  Past Medical History:  Diagnosis Date   AKI (acute kidney injury) (HCC) 09/30/2017   Alcohol  abuse    Atrial fibrillation (HCC)    a. patient reports being diagnosed with A fib ~ 15-20 years prior in [place],  [place]; b. noted to be in Afib 1/19; c. CHADS2VASc => 4 (HTN, age x 2, vascular dosease)   CHF (congestive heart failure) (HCC)    HLD (hyperlipidemia)    Hypertension    Macrocytic anemia    Morbid obesity (HCC)     Past Surgical History:  Procedure Laterality Date   CARDIOVERSION N/A 11/14/2017   Procedure: CARDIOVERSION;  Surgeon: Devorah Fonder, MD;  Location: [facility] ORS;  Service: Cardiovascular;  Laterality: N/A;   ESOPHAGOGASTRODUODENOSCOPY (EGD) WITH PROPOFOL  N/A 02/28/2022   Procedure: ESOPHAGOGASTRODUODENOSCOPY (EGD) WITH PROPOFOL ;  Surgeon: Shane Darling, MD;  Location: [facility] ENDOSCOPY;  Service: Endoscopy;  Laterality: N/A;   KNEE SURGERY      Prior to Admission medications   Medication Sig Start Date End Date Taking? Authorizing Provider  cyanocobalamin  (VITAMIN B12) 1000 MCG tablet Take 1,000 mcg by mouth daily. 08/13/23 08/12/24 Yes [provider]  KLOR-CON  M20 20 MEQ tablet Take 20 mEq by mouth daily.  10/06/23  Yes [provider]  mirtazapine (REMERON) 15 MG tablet Take 1 tablet by mouth at bedtime. 08/15/23 08/14/24 Yes [provider]  XARELTO  20 MG TABS tablet Take 1 tablet by mouth daily. 11/28/23  Yes [provider]  atorvastatin  (LIPITOR) 40 MG tablet Take 1 tablet (40 mg total) by mouth daily. Please call for an appointment for more refills 05/19/19   Terre Ferri P, DO  folic acid  (FOLVITE ) 1 MG tablet Take 1 mg by mouth daily. 04/30/21   [provider]  furosemide  (LASIX ) 20 MG tablet Take 1 tablet (20 mg total) by mouth daily as needed (swelling). 03/02/22   Montey Apa, DO  metoprolol  tartrate (LOPRESSOR ) 25 MG tablet Take 1 tablet (25 mg total) by mouth 2 (two) times daily. 03/02/22   Montey Apa, DO  pantoprazole  (PROTONIX ) 40 MG tablet Take 1 tablet (40 mg total) by mouth 2 (two) times daily. 03/02/22 05/01/22  Montey Apa, DO    Family History  Problem Relation Age of Onset   Liver disease Mother     Heart disease Father    CAD Brother    Cancer Brother    Cancer Brother    Cancer Brother      Social History   Tobacco Use   Smoking status: Never   Smokeless tobacco: Never  Vaping Use   Vaping status: Never Used  Substance Use Topics   Alcohol  use: Yes    Alcohol /week: 4.0 - 5.0 standard drinks of alcohol     Types: 3 - 4 Cans of beer, 1 Shots of liquor per week    Comment: last drink yesterday morning 02/27/22   Drug use: No    Allergies as of 01/13/2024   (No Known Allergies)    Review of Systems:    All systems reviewed and negative except where noted in HPI.   Physical Exam:  Vital signs in last 24 hours: Temp:  [97.5 F (36.4 C)-99 F (37.2 C)] 97.5 F (36.4 C) (05/14 0355) Pulse Rate:  [89-120] 94 (05/14 0355) Resp:  [16-27] 18 (05/14 0355) BP: (106-127)/(57-71) 106/70 (05/14 0355) SpO2:  [96 %-100 %] 99 % (05/14 0355) Last BM Date : 01/12/24 General:   Pleasant, cooperative in NAD Head:  Normocephalic and atraumatic. Eyes:   No icterus.   Conjunctiva pink. PERRLA. Ears:  Normal auditory acuity. Neck:  Supple; no masses or thyroidomegaly Lungs: Respirations even and unlabored. Lungs clear to auscultation bilaterally.   No wheezes, crackles, or rhonchi.  Heart:  Regular rate and rhythm;  Without murmur, clicks, rubs or gallops Abdomen:  Soft, nondistended, nontender. Normal bowel sounds. No appreciable masses or hepatomegaly.  No rebound or guarding.  Rectal:  Not performed. Msk:  Symmetrical without gross deformities.   Extremities:  Without edema, cyanosis or clubbing. Neurologic:  Alert and oriented x3;  grossly normal neurologically. Skin:  Intact without significant lesions or rashes. Cervical Nodes:  No significant cervical adenopathy. Psych:  Alert and cooperative. Normal affect.  LAB RESULTS: Recent Labs    01/13/24 1255 01/13/24 1737 01/13/24 2124 01/14/24 0615  WBC 7.0  --   --  5.5  HGB 5.7* 6.5* 7.0* 7.2*  HCT 20.6* 22.3* 23.1*  23.7*  PLT 296  --   --  200   BMET Recent Labs    01/13/24 1255 01/14/24 0615  NA 136 137  K 3.8 3.4*  CL 103 108  CO2 22 25  GLUCOSE 134* 98  BUN 35* 28*  CREATININE 1.75* 1.61*  CALCIUM  8.7* 8.0*   LFT Recent Labs    01/13/24 1255  PROT 6.1*  ALBUMIN 3.3*  AST 17  ALT 7  ALKPHOS 42  BILITOT 1.6*   PT/INR Recent Labs    01/13/24 1255  LABPROT 32.8*  INR 3.2*    STUDIES: No results found.    Impression / Plan:   Assessment: Principal Problem:   Symptomatic anemia Active Problems:   HTN (hypertension)   HLD (hyperlipidemia)   AKI (acute kidney injury) (HCC)   Alcohol  abuse   Depression, major, single episode, in partial remission (HCC)   Tris Gowder is a 83 y.o. y/o male with with a history of peptic ulcer disease and was supposed to follow-up with a repeat EGD 3 months after the past scope but it does not appear that the patient pursued that recommendation.  The patient has intermittent black stools but denies any NSAID use.  It also appears that the patient has not had a colonoscopy as per his recollection and chart review.  Plan:  The patient will be set up for an upper endoscopy for today.  The patient has been on a clear liquid diet but states that he has not eaten or drank anything today.  The patient has been explained the plan and agrees with it.  If the EGD does not show a cause for his anemia the patient may need to undergo a colonoscopy.    Thank you for involving me in the care of this patient.      LOS: 1 day   [name], MD, MD. Sylvan Evener 01/14/2024, 8:12 AM,  Pager [phone] 7am-5pm  Check AMION for 5pm -7am coverage and on weekends   Note: This dictation was prepared with Dragon dictation along with smaller phrase technology. Any transcriptional errors that result from this process are unintentional.

## 2024-01-14 NOTE — Progress Notes (Signed)
 PROGRESS NOTE    Michael Rowe  ZOX:[id number] DOB: [birth year] DOA: 01/13/2024 PCP: Deliah Fells, NP    Assessment & Plan:   Principal Problem:   Symptomatic anemia Active Problems:   AKI (acute kidney injury) (HCC)   HTN (hypertension)   Depression, major, single episode, in partial remission (HCC)   HLD (hyperlipidemia)   Alcohol  abuse  Assessment and Plan: Symptomatic anemia: w/ component of acute blood loss anemia. Etiology unclear. S/p EGD which showed normal, stomach, normal examined duodenum & fresh blood in the mouth. S/p 1 unit of pRBCs transfused so far. Drinks approx 3 shots of vodka daily.   AKI: Cr is trending down from day prior. Avoid nephrotoxic meds    HTN: continue on home dose of metoprolol     Alcohol  abuse: drinks 3 shots of vodka daily. Continue on CIWA protocol. Continue on PPI. Received alcohol  cessation counseling    HLD: will restart home statin       DVT prophylaxis: SCDs Code Status: full  Family Communication:  Disposition Plan: likely d/c back home   Level of care: Telemetry Medical  Status is: Inpatient Remains inpatient appropriate because: severity of illness    Consultants:  GI    Procedures:   Antimicrobials:    Subjective: Pt c/o malaise. Pt denies any black stools today   Objective: Vitals:   01/13/24 1832 01/13/24 1858 01/13/24 2103 01/14/24 0355  BP: 119/71 114/63 114/69 106/70  Pulse: 96 95 97 94  Resp: 20 16 16 18   Temp: 98 F (36.7 C) 97.6 F (36.4 C) 97.9 F (36.6 C) (!) 97.5 F (36.4 C)  TempSrc: Oral  Oral Oral  SpO2: 100%  99% 99%  Height:        Intake/Output Summary (Last 24 hours) at 01/14/2024 0754 Last data filed at 01/13/2024 2212 Gross per 24 hour  Intake 983.67 ml  Output 350 ml  Net 633.67 ml   There were no vitals filed for this visit.  Examination:  General exam: Appears calm and comfortable  Respiratory system: Clear to auscultation. Respiratory effort  normal. Cardiovascular system: S1 & S2+. No rubs, gallops or clicks. Gastrointestinal system: Abdomen is nondistended, soft and nontender.  Normal bowel sounds heard. Central nervous system: Alert and oriented. Moves all extremities  Psychiatry: Judgement and insight appear normal. Mood & affect appropriate.     Data Reviewed: I have personally reviewed following labs and imaging studies  CBC: Recent Labs  Lab 01/13/24 1255 01/13/24 1737 01/13/24 2124 01/14/24 0615  WBC 7.0  --   --  5.5  HGB 5.7* 6.5* 7.0* 7.2*  HCT 20.6* 22.3* 23.1* 23.7*  MCV 73.0*  --   --  74.1*  PLT 296  --   --  200   Basic Metabolic Panel: Recent Labs  Lab 01/13/24 1255 01/14/24 0615  NA 136 137  K 3.8 3.4*  CL 103 108  CO2 22 25  GLUCOSE 134* 98  BUN 35* 28*  CREATININE 1.75* 1.61*  CALCIUM  8.7* 8.0*   GFR: CrCl cannot be calculated (Unknown ideal weight.). Liver Function Tests: Recent Labs  Lab 01/13/24 1255  AST 17  ALT 7  ALKPHOS 42  BILITOT 1.6*  PROT 6.1*  ALBUMIN 3.3*   No results for input(s): "LIPASE", "AMYLASE" in the last 168 hours. No results for input(s): "AMMONIA" in the last 168 hours. Coagulation Profile: Recent Labs  Lab 01/13/24 1255  INR 3.2*   Cardiac Enzymes: No results for input(s): "CKTOTAL", "CKMB", "CKMBINDEX", "  TROPONINI" in the last 168 hours. BNP (last 3 results) No results for input(s): "PROBNP" in the last 8760 hours. HbA1C: No results for input(s): "HGBA1C" in the last 72 hours. CBG: No results for input(s): "GLUCAP" in the last 168 hours. Lipid Profile: No results for input(s): "CHOL", "HDL", "LDLCALC", "TRIG", "CHOLHDL", "LDLDIRECT" in the last 72 hours. Thyroid  Function Tests: No results for input(s): "TSH", "T4TOTAL", "FREET4", "T3FREE", "THYROIDAB" in the last 72 hours. Anemia Panel: No results for input(s): "VITAMINB12", "FOLATE", "FERRITIN", "TIBC", "IRON", "RETICCTPCT" in the last 72 hours. Sepsis Labs: No results for input(s):  "PROCALCITON", "LATICACIDVEN" in the last 168 hours.  No results found for this or any previous visit (from the past 240 hours).       Radiology Studies: No results found.      Scheduled Meds:  cyanocobalamin   1,000 mcg Oral Daily   feeding supplement  237 mL Oral BID BM   folic acid   1 mg Oral Daily   metoprolol  tartrate  25 mg Oral BID   multivitamin with minerals  1 tablet Oral Daily   pantoprazole  (PROTONIX ) IV  40 mg Intravenous BID   thiamine   100 mg Oral Daily   Or   thiamine   100 mg Intravenous Daily   Continuous Infusions:  sodium chloride  Stopped (01/13/24 2026)     LOS: 1 day       Alphonsus Jeans, MD [facility] Pager [phone]  If 7PM-7AM, please contact night-coverage [email or web address] 01/14/2024, 7:54 AM

## 2024-01-14 NOTE — Transfer of Care (Signed)
 Immediate Anesthesia Transfer of Care Note  Patient: Michael Rowe  Procedure(s) Performed: EGD (ESOPHAGOGASTRODUODENOSCOPY)  Patient Location: PACU and Endoscopy Unit  Anesthesia Type:General  Level of Consciousness: awake, alert , oriented, and patient cooperative  Airway & Oxygen Therapy: Patient Spontanous Breathing  Post-op Assessment: Report given to RN and Post -op Vital signs reviewed and stable  Post vital signs: Reviewed and stable  Last Vitals:  Vitals Value Taken Time  BP 95/60 01/14/24 1408  Temp 36.9 C 01/14/24 1407  Pulse 91 01/14/24 1410  Resp 20 01/14/24 1410  SpO2 98 % 01/14/24 1410  Vitals shown include unfiled device data.  Last Pain:  Vitals:   01/14/24 1407  TempSrc: Temporal  PainSc: 0-No pain         Complications: No notable events documented.

## 2024-01-14 NOTE — Anesthesia Postprocedure Evaluation (Signed)
 Anesthesia Post Note  Patient: Michael Rowe  Procedure(s) Performed: EGD (ESOPHAGOGASTRODUODENOSCOPY)  Patient location during evaluation: Endoscopy Anesthesia Type: General Level of consciousness: awake and alert Pain management: pain level controlled Vital Signs Assessment: post-procedure vital signs reviewed and stable Respiratory status: spontaneous breathing, nonlabored ventilation, respiratory function stable and patient connected to nasal cannula oxygen Cardiovascular status: blood pressure returned to baseline and stable Postop Assessment: no apparent nausea or vomiting Anesthetic complications: no  No notable events documented.   Last Vitals:  Vitals:   01/14/24 1407 01/14/24 1427  BP: 95/60 115/71  Pulse: 96   Resp: 20   Temp: 36.9 C   SpO2: 98%     Last Pain:  Vitals:   01/14/24 1427  TempSrc:   PainSc: 0-No pain                 Enrique Harvest

## 2024-01-14 NOTE — Progress Notes (Signed)
 The patient underwent an EGD today without any sign of any bleeding in the small bowel or stomach.  There was blood seen in the oropharynx and mouth traveling down the esophagus.  On withdrawal of the scope into the mouth there was bright red fresh blood in the mouth.  The patient was informed of the findings and states that he had known that he was bleeding from his mouth and googled it and thought it may be coming from his teeth.  There is no sign of any GI bleeding varices ulcers gastritis or esophagitis seen.  Nothing further to do from a GI point of view.  I will sign off.  Please call if any further GI concerns or questions.  We would like to thank you for the opportunity to participate in the care of Michael Rowe.

## 2024-01-14 NOTE — Anesthesia Preprocedure Evaluation (Addendum)
 Anesthesia Evaluation  Patient identified by MRN, date of birth, ID band Patient awake    Reviewed: Allergy & Precautions, NPO status , Patient's Chart, lab work & pertinent test results  History of Anesthesia Complications Negative for: history of anesthetic complications  Airway Mallampati: IV   Neck ROM: Full    Dental  (+) Poor Dentition, Missing, Chipped   Pulmonary sleep apnea    Pulmonary exam normal breath sounds clear to auscultation       Cardiovascular hypertension, +CHF  Normal cardiovascular exam+ dysrhythmias (a fib on Xarelto )  Rhythm:Regular Rate:Normal  ECG 01/13/24: Atrial fibrillation Ventricular premature complex Low voltage, extremity and precordial leads Abnormal R-wave progression, early transition Minimal ST depression, anterolateral leads   Neuro/Psych Alcohol  use disorder, 3 shots per day    GI/Hepatic negative GI ROS,,,  Endo/Other  Obesity   Renal/GU Renal disease (stage III CKD)     Musculoskeletal   Abdominal   Peds  Hematology  (+) Blood dyscrasia, anemia   Anesthesia Other Findings   Reproductive/Obstetrics                             Anesthesia Physical Anesthesia Plan  ASA: 3  Anesthesia Plan: General   Post-op Pain Management:    Induction: Intravenous  PONV Risk Score and Plan: 2 and Propofol  infusion, TIVA and Treatment may vary due to age or medical condition  Airway Management Planned: Natural Airway  Additional Equipment:   Intra-op Plan:   Post-operative Plan:   Informed Consent: I have reviewed the patients History and Physical, chart, labs and discussed the procedure including the risks, benefits and alternatives for the proposed anesthesia with the patient or authorized representative who has indicated his/her understanding and acceptance.       Plan Discussed with: CRNA  Anesthesia Plan Comments: (LMA/GETA backup  discussed.  Patient consented for risks of anesthesia including but not limited to:  - adverse reactions to medications - damage to eyes, teeth, lips or other oral mucosa - nerve damage due to positioning  - sore throat or hoarseness - damage to heart, brain, nerves, lungs, other parts of body or loss of life  Informed patient about role of CRNA in peri- and intra-operative care.  Patient voiced understanding.)        Anesthesia Quick Evaluation

## 2024-01-14 NOTE — Care Management Obs Status (Signed)
 MEDICARE OBSERVATION STATUS NOTIFICATION   Patient Details  Name: Michael Rowe MRN: [id number] Date of Birth: Jan 26, 1941   Medicare Observation Status Notification Given:  Yes    Diana Armijo A Hiliary Osorto, RN 01/14/2024, 4:28 PM

## 2024-01-14 NOTE — Plan of Care (Signed)
  Problem: Activity: Goal: Risk for activity intolerance will decrease Outcome: Progressing   Problem: Coping: Goal: Level of anxiety will decrease Outcome: Progressing   Problem: Elimination: Goal: Will not experience complications related to bowel motility Outcome: Progressing Goal: Will not experience complications related to urinary retention Outcome: Progressing   Problem: Pain Managment: Goal: General experience of comfort will improve and/or be controlled Outcome: Progressing

## 2024-01-15 ENCOUNTER — Encounter
Admission: EM | Disposition: A | Discharge status: Home / Self Care | Payer: Self-pay | Source: Home / Self Care | Attending: Emergency Medicine

## 2024-01-15 ENCOUNTER — Observation Stay: Admitting: Anesthesiology

## 2024-01-15 ENCOUNTER — Encounter: Payer: Self-pay | Admitting: Gastroenterology

## 2024-01-15 ENCOUNTER — Observation Stay

## 2024-01-15 DIAGNOSIS — K922 Gastrointestinal hemorrhage, unspecified: Secondary | ICD-10-CM

## 2024-01-15 DIAGNOSIS — I4811 Longstanding persistent atrial fibrillation: Secondary | ICD-10-CM | POA: Diagnosis not present

## 2024-01-15 DIAGNOSIS — Z01810 Encounter for preprocedural cardiovascular examination: Secondary | ICD-10-CM

## 2024-01-15 DIAGNOSIS — K640 First degree hemorrhoids: Secondary | ICD-10-CM

## 2024-01-15 DIAGNOSIS — C187 Malignant neoplasm of sigmoid colon: Secondary | ICD-10-CM

## 2024-01-15 DIAGNOSIS — K921 Melena: Secondary | ICD-10-CM

## 2024-01-15 DIAGNOSIS — D125 Benign neoplasm of sigmoid colon: Secondary | ICD-10-CM | POA: Diagnosis not present

## 2024-01-15 DIAGNOSIS — K5730 Diverticulosis of large intestine without perforation or abscess without bleeding: Secondary | ICD-10-CM

## 2024-01-15 DIAGNOSIS — D649 Anemia, unspecified: Secondary | ICD-10-CM | POA: Diagnosis not present

## 2024-01-15 DIAGNOSIS — C189 Malignant neoplasm of colon, unspecified: Secondary | ICD-10-CM | POA: Diagnosis not present

## 2024-01-15 DIAGNOSIS — D490 Neoplasm of unspecified behavior of digestive system: Secondary | ICD-10-CM

## 2024-01-15 DIAGNOSIS — F1010 Alcohol abuse, uncomplicated: Secondary | ICD-10-CM | POA: Diagnosis not present

## 2024-01-15 DIAGNOSIS — N281 Cyst of kidney, acquired: Secondary | ICD-10-CM | POA: Diagnosis not present

## 2024-01-15 DIAGNOSIS — I4891 Unspecified atrial fibrillation: Secondary | ICD-10-CM | POA: Diagnosis not present

## 2024-01-15 DIAGNOSIS — K6389 Other specified diseases of intestine: Secondary | ICD-10-CM

## 2024-01-15 HISTORY — DX: Neoplasm of unspecified behavior of digestive system: D49.0

## 2024-01-15 HISTORY — DX: Longstanding persistent atrial fibrillation: I48.11

## 2024-01-15 HISTORY — DX: Melena: K92.1

## 2024-01-15 HISTORY — PX: COLONOSCOPY: SHX5424

## 2024-01-15 LAB — BASIC METABOLIC PANEL WITH GFR
Anion gap: 7 (ref 5–15)
BUN: 20 mg/dL (ref 8–23)
CO2: 24 mmol/L (ref 22–32)
Calcium: 7.9 mg/dL — ABNORMAL LOW (ref 8.9–10.3)
Chloride: 108 mmol/L (ref 98–111)
Creatinine, Ser: 1.34 mg/dL — ABNORMAL HIGH (ref 0.61–1.24)
GFR, Estimated: 53 mL/min — ABNORMAL LOW (ref >60)
Glucose, Bld: 97 mg/dL (ref 70–99)
Potassium: 4.6 mmol/L (ref 3.5–5.1)
Sodium: 139 mmol/L (ref 135–145)

## 2024-01-15 LAB — CBC
HCT: 26.9 % — ABNORMAL LOW (ref 39.0–52.0)
Hemoglobin: 8 g/dL — ABNORMAL LOW (ref 13.0–17.0)
MCH: 22.6 pg — ABNORMAL LOW (ref 26.0–34.0)
MCHC: 29.7 g/dL — ABNORMAL LOW (ref 30.0–36.0)
MCV: 76 fL — ABNORMAL LOW (ref 80.0–100.0)
Platelets: 211 10*3/uL (ref 150–400)
RBC: 3.54 MIL/uL — ABNORMAL LOW (ref 4.22–5.81)
RDW: 20.4 % — ABNORMAL HIGH (ref 11.5–15.5)
WBC: 5.6 10*3/uL (ref 4.0–10.5)
nRBC: 0 % (ref 0.0–0.2)

## 2024-01-15 SURGERY — COLONOSCOPY
Anesthesia: General

## 2024-01-15 MED ORDER — SPOT INK MARKER SYRINGE KIT
PACK | SUBMUCOSAL | Status: DC | PRN
  Administered 2024-01-15: 1 mL via SUBMUCOSAL

## 2024-01-15 MED ORDER — PROPOFOL 10 MG/ML IV BOLUS
INTRAVENOUS | Status: DC | PRN
  Administered 2024-01-15: 80 mg via INTRAVENOUS

## 2024-01-15 MED ORDER — IRON SUCROSE 200 MG IVPB - SIMPLE MED
200.0000 mg | Freq: Once | Status: AC
  Administered 2024-01-15: 200 mg via INTRAVENOUS
  Filled 2024-01-15: qty 200

## 2024-01-15 MED ORDER — IOHEXOL 300 MG/ML  SOLN
30.0000 mL | Freq: Once | INTRAMUSCULAR | Status: AC | PRN
  Administered 2024-01-15: 30 mL via ORAL

## 2024-01-15 MED ORDER — ATORVASTATIN CALCIUM 20 MG PO TABS
40.0000 mg | ORAL_TABLET | Freq: Every day | ORAL | Status: DC
  Administered 2024-01-16: 40 mg via ORAL
  Filled 2024-01-15: qty 2

## 2024-01-15 MED ORDER — VITAMIN K1 10 MG/ML IJ SOLN
5.0000 mg | Freq: Once | INTRAMUSCULAR | Status: AC
  Administered 2024-01-15: 5 mg via INTRAVENOUS
  Filled 2024-01-15: qty 0.5

## 2024-01-15 MED ORDER — IOHEXOL 300 MG/ML  SOLN
100.0000 mL | Freq: Once | INTRAMUSCULAR | Status: AC | PRN
  Administered 2024-01-15: 100 mL via INTRAVENOUS

## 2024-01-15 MED ORDER — PROPOFOL 500 MG/50ML IV EMUL
INTRAVENOUS | Status: DC | PRN
Start: 2024-01-15 — End: 2024-01-15
  Administered 2024-01-15: 100 ug/kg/min via INTRAVENOUS

## 2024-01-15 MED ORDER — LIDOCAINE HCL (PF) 2 % IJ SOLN
INTRAMUSCULAR | Status: AC
  Filled 2024-01-15: qty 5

## 2024-01-15 MED ORDER — SODIUM CHLORIDE 0.9 % IV SOLN: INTRAVENOUS | Status: DC

## 2024-01-15 NOTE — Anesthesia Preprocedure Evaluation (Signed)
 Anesthesia Evaluation  Patient identified by MRN, date of birth, ID band Patient awake    Reviewed: Allergy & Precautions, NPO status , Patient's Chart, lab work & pertinent test results  History of Anesthesia Complications Negative for: history of anesthetic complications  Airway Mallampati: III  TM Distance: <3 FB Neck ROM: full    Dental  (+) Missing   Pulmonary neg pulmonary ROS, neg shortness of breath   Pulmonary exam normal        Cardiovascular Exercise Tolerance: Good hypertension, (-) angina +CHF  + dysrhythmias Atrial Fibrillation      Neuro/Psych negative neurological ROS     GI/Hepatic negative GI ROS, Neg liver ROS,neg GERD  ,,  Endo/Other  negative endocrine ROS    Renal/GU Renal disease  negative genitourinary   Musculoskeletal   Abdominal   Peds  Hematology  (+) Blood dyscrasia, anemia   Anesthesia Other Findings Past Medical History: 09/30/2017: AKI (acute kidney injury) (HCC) No date: Alcohol  abuse No date: Atrial fibrillation (HCC)     Comment:  a. patient reports being diagnosed with A fib ~ 15-20               years prior in [place], [place]; b. noted to be in Afib 1/19;               c. CHADS2VASc => 4 (HTN, age x 2, vascular dosease) No date: CHF (congestive heart failure) (HCC) No date: HLD (hyperlipidemia) No date: Hypertension No date: Macrocytic anemia No date: Morbid obesity [facility])  Past Surgical History: 11/14/2017: CARDIOVERSION; N/A     Comment:  Procedure: CARDIOVERSION;  Surgeon: Devorah Fonder,               MD;  Location: [facility] ORS;  Service: Cardiovascular;                Laterality: N/A; 01/14/2024: ESOPHAGOGASTRODUODENOSCOPY; N/A     Comment:  Procedure: EGD (ESOPHAGOGASTRODUODENOSCOPY);  Surgeon:               [name], MD;  Location: [facility] ENDOSCOPY;  Service:               Endoscopy;  Laterality: N/A; 02/28/2022: ESOPHAGOGASTRODUODENOSCOPY (EGD) WITH PROPOFOL ; N/A      Comment:  Procedure: ESOPHAGOGASTRODUODENOSCOPY (EGD) WITH               PROPOFOL ;  Surgeon: Shane Darling, MD;  Location:               [facility] ENDOSCOPY;  Service: Endoscopy;  Laterality: N/A; No date: KNEE SURGERY  BMI    Body Mass Index: 29.29 kg/m      Reproductive/Obstetrics negative OB ROS                             Anesthesia Physical Anesthesia Plan  ASA: 3  Anesthesia Plan: General   Post-op Pain Management:    Induction: Intravenous  PONV Risk Score and Plan: Propofol  infusion and TIVA  Airway Management Planned: Natural Airway and Nasal Cannula  Additional Equipment:   Intra-op Plan:   Post-operative Plan:   Informed Consent: I have reviewed the patients History and Physical, chart, labs and discussed the procedure including the risks, benefits and alternatives for the proposed anesthesia with the patient or authorized representative who has indicated his/her understanding and acceptance.     Dental Advisory Given  Plan Discussed with: Anesthesiologist, CRNA and Surgeon  Anesthesia Plan  Comments: (Patient consented for risks of anesthesia including but not limited to:  - adverse reactions to medications - risk of airway placement if required - damage to eyes, teeth, lips or other oral mucosa - nerve damage due to positioning  - sore throat or hoarseness - Damage to heart, brain, nerves, lungs, other parts of body or loss of life  Patient voiced understanding and assent.)       Anesthesia Quick Evaluation

## 2024-01-15 NOTE — Anesthesia Postprocedure Evaluation (Signed)
 Anesthesia Post Note  Patient: Michael Rowe  Procedure(s) Performed: COLONOSCOPY  Patient location during evaluation: Endoscopy Anesthesia Type: General Level of consciousness: awake and alert Pain management: pain level controlled Vital Signs Assessment: post-procedure vital signs reviewed and stable Respiratory status: spontaneous breathing, nonlabored ventilation, respiratory function stable and patient connected to nasal cannula oxygen Cardiovascular status: blood pressure returned to baseline and stable Postop Assessment: no apparent nausea or vomiting Anesthetic complications: no   No notable events documented.   Last Vitals:  Vitals:   01/15/24 1205 01/15/24 1215  BP:  110/65  Pulse:  80  Resp: (!) 22 20  Temp:    SpO2:  100%    Last Pain:  Vitals:   01/15/24 1215  TempSrc:   PainSc: 0-No pain                 Portia Brittle Siya Flurry

## 2024-01-15 NOTE — Op Note (Signed)
 [facility] Gastroenterology Patient Name: Michael Rowe Procedure Date: 01/15/2024 11:26 AM MRN: [id number] Account #: [id number] Date of Birth: [birth year] Admit Type: Outpatient Age: 83 Room: [facility] ENDO ROOM 4 Gender: Male Note Status: Finalized Instrument Name: Hyman Main [id number] Procedure:             Colonoscopy Indications:           Hematochezia Providers:             [name] MD, MD Referring MD:          Samual Crochet (Referring MD) Medicines:             Propofol  per Anesthesia Complications:         No immediate complications. Procedure:             Pre-Anesthesia Assessment:                        - Prior to the procedure, a History and Physical was                         performed, and patient medications and allergies were                         reviewed. The patient's tolerance of previous                         anesthesia was also reviewed. The risks and benefits                         of the procedure and the sedation options and risks                         were discussed with the patient. All questions were                         answered, and informed consent was obtained. Prior                         Anticoagulants: The patient has taken no anticoagulant                         or antiplatelet agents. ASA Grade Assessment: II - A                         patient with mild systemic disease. After reviewing                         the risks and benefits, the patient was deemed in                         satisfactory condition to undergo the procedure.                        After obtaining informed consent, the colonoscope was                         passed under direct vision. Throughout the procedure,  the patient's blood pressure, pulse, and oxygen                         saturations were monitored continuously. The                         Colonoscope was introduced through the anus and                          advanced to the the cecum, identified by appendiceal                         orifice and ileocecal valve. The colonoscopy was                         performed without difficulty. The patient tolerated                         the procedure well. The quality of the bowel                         preparation was excellent. Findings:      The perianal and digital rectal examinations were normal.      An ulcerated non-obstructing small mass was found in the proximal       sigmoid colon. The mass was non-circumferential. No bleeding was       present. This was biopsied with a cold forceps for histology. Area was       tattooed with an injection of 3 mL of India ink.      Multiple small-mouthed diverticula were found in the entire colon.      Non-bleeding internal hemorrhoids were found during retroflexion. The       hemorrhoids were Grade I (internal hemorrhoids that do not prolapse). 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 Biopsied. Tattooed.                        - Diverticulosis in the entire examined colon.                        - Non-bleeding internal hemorrhoids. Recommendation:        - Discharge patient to home.                        - Resume previous diet.                        - Continue present medications.                        - Await pathology results.                        - Refer to a Careers adviser. Procedure Code(s):     --- Professional ---                        [phone],  Colonoscopy, flexible; with biopsy, single or                         multiple                        45381, Colonoscopy, flexible; with directed submucosal                         injection(s), any substance Diagnosis Code(s):     --- Professional ---                        K92.1, Melena (includes Hematochezia)                        D49.0, Neoplasm of unspecified behavior of digestive                         system CPT copyright 2022 [employer]  [employer] Association. All rights reserved. The codes documented in this report are preliminary and upon coder review may  be revised to meet current compliance requirements. [name] MD, MD 01/15/2024 11:53:07 AM This report has been signed electronically. Number of Addenda: 0 Note Initiated On: 01/15/2024 11:26 AM Scope Withdrawal Time: 0 hours 6 minutes 44 seconds  Total Procedure Duration: 0 hours 12 minutes 34 seconds  Estimated Blood Loss:  Estimated blood loss: none.      [facility]

## 2024-01-15 NOTE — Plan of Care (Signed)

## 2024-01-15 NOTE — Consult Note (Signed)
 Cardiology Consultation   Patient ID: Michael Rowe MRN: [id number]; DOB: [birth year]  Admit date: 01/13/2024 Date of Consult: 01/15/2024  PCP:  Deliah Fells, NP   [facility] Providers Cardiologist:  new to [facility] Physician requesting consult: Dr. Dana Duncan Reason for consult: Preop cardiovascular evaluation, atrial fibrillation  Patient Profile:   Michael Rowe is a 83 y.o. male with a hx of long-term persistent atrial fibrillation, hypertension, chronic alcohol  abuse, peptic ulcer disease, gastric ulcer June 2023, chronic anemia CHF presenting with anemia  History of Present Illness:   Michael Rowe presented via [facility] with hemoglobin 5.3, orthostatic hypotension, weakness In triage heart rate up to 170 bpm, down to 107 with rest, notes indicating he was on Xarelto  Increasing activity intolerance secondary to symptoms, reported intermittent black stools every few days, chronic bleeding from his mouth After arrival to the emergency room, had transfusion Denied NSAIDs, reports drinking 2-4 shots of vodka every night, last drink night before admission, daily alcohol  Baseline hemoglobin typically 8-9  INR 3.2 Xarelto  held on admission, started on PPI  Underwent EGD showing fresh blood in the mouth traveling down the esophagus, normal stomach, duodenum, positive hiatal hernia Plan for colonoscopy Sigmoid mass  CT scan chest abdomen pelvis June 2023 Coronary calcification, aortic atherosclerosis Just pulled up and reviewed personally showing mild diffuse aortic atherosclerosis, moderate three-vessel coronary calcification  Past Medical History:  Diagnosis Date   AKI (acute kidney injury) (HCC) 09/30/2017   Alcohol  abuse    Atrial fibrillation (HCC)    a. patient reports being diagnosed with A fib ~ 15-20 years prior in [place], [place]; b. noted to be in Afib 1/19; c. CHADS2VASc => 4 (HTN, age x 2, vascular dosease)   CHF (congestive heart failure) (HCC)     HLD (hyperlipidemia)    Hypertension    Macrocytic anemia    Morbid obesity (HCC)     Past Surgical History:  Procedure Laterality Date   CARDIOVERSION N/A 11/14/2017   Procedure: CARDIOVERSION;  Surgeon: Devorah Fonder, MD;  Location: [facility] ORS;  Service: Cardiovascular;  Laterality: N/A;   COLONOSCOPY N/A 01/15/2024   Procedure: COLONOSCOPY;  Surgeon: [name], MD;  Location: [facility] ENDOSCOPY;  Service: Endoscopy;  Laterality: N/A;   ESOPHAGOGASTRODUODENOSCOPY N/A 01/14/2024   Procedure: EGD (ESOPHAGOGASTRODUODENOSCOPY);  Surgeon: [name], MD;  Location: [facility] ENDOSCOPY;  Service: Endoscopy;  Laterality: N/A;   ESOPHAGOGASTRODUODENOSCOPY (EGD) WITH PROPOFOL  N/A 02/28/2022   Procedure: ESOPHAGOGASTRODUODENOSCOPY (EGD) WITH PROPOFOL ;  Surgeon: Shane Darling, MD;  Location: [facility] ENDOSCOPY;  Service: Endoscopy;  Laterality: N/A;   KNEE SURGERY       Home Medications:  Prior to Admission medications   Medication Sig Start Date End Date Taking? Authorizing Provider  atorvastatin  (LIPITOR) 40 MG tablet Take 1 tablet (40 mg total) by mouth daily. Please call for an appointment for more refills 05/19/19  Yes Johnson, Megan P, DO  cyanocobalamin  (VITAMIN B12) 1000 MCG tablet Take 1,000 mcg by mouth daily. 08/13/23 08/12/24 Yes [provider]  folic acid  (FOLVITE ) 1 MG tablet Take 1 mg by mouth daily. 04/30/21  Yes [provider]  furosemide  (LASIX ) 20 MG tablet Take 1 tablet (20 mg total) by mouth daily as needed (swelling). 03/02/22  Yes Darus Engels A, DO  KLOR-CON  M20 20 MEQ tablet Take 20 mEq by mouth daily. 10/06/23  Yes [provider]  metoprolol  tartrate (LOPRESSOR ) 25 MG tablet Take 1 tablet (25 mg total) by mouth 2 (two) times daily. 03/02/22  Yes Darus Engels A, DO  mirtazapine (REMERON) 15 MG tablet Take 1 tablet by mouth at bedtime. 08/15/23 08/14/24 Yes [provider]  pantoprazole  (PROTONIX ) 40 MG tablet Take 1 tablet (40 mg total) by  mouth 2 (two) times daily. 03/02/22 01/15/24 Yes Darus Engels A, DO  XARELTO  20 MG TABS tablet Take 1 tablet by mouth daily. 11/28/23  Yes [provider]    Inpatient Medications: Scheduled Meds:  atorvastatin   40 mg Oral Daily   cyanocobalamin   1,000 mcg Oral Daily   feeding supplement  237 mL Oral BID BM   folic acid   1 mg Oral Daily   metoprolol  tartrate  25 mg Oral BID   multivitamin with minerals  1 tablet Oral Daily   pantoprazole  (PROTONIX ) IV  40 mg Intravenous BID   thiamine   100 mg Oral Daily   Or   thiamine   100 mg Intravenous Daily   Continuous Infusions:  iron sucrose     PRN Meds: acetaminophen  **OR** acetaminophen , hydrALAZINE, iohexol , LORazepam  **OR** LORazepam , metoprolol  tartrate, ondansetron  **OR** ondansetron  (ZOFRAN ) IV, senna-docusate  Allergies:   No Known Allergies  Social History:   Social History   Socioeconomic History   Marital status: Married    Spouse name: Not on file   Number of children: 3   Years of education: college   Highest education level: Bachelor's degree (e.g., BA, AB, BS)  Occupational History   Not on file  Tobacco Use   Smoking status: Never   Smokeless tobacco: Never  Vaping Use   Vaping status: Never Used  Substance and Sexual Activity   Alcohol  use: Yes    Alcohol /week: 4.0 - 5.0 standard drinks of alcohol     Types: 3 - 4 Cans of beer, 1 Shots of liquor per week    Comment: last drink yesterday morning 02/27/22   Drug use: No   Sexual activity: Never  Other Topics Concern   Not on file  Social History Narrative   Not on file   Social Drivers of Health   Financial Resource Strain: Low Risk  (02/13/2023)   Received from [facility]   Overall Financial Resource Strain (CARDIA)    Difficulty of Paying Living Expenses: Not hard at all  Food Insecurity: No Food Insecurity (01/13/2024)   Hunger Vital Sign    Worried About Running Out of Food in the Last Year: Never true    Ran Out of Food  in the Last Year: Never true  Transportation Needs: No Transportation Needs (01/13/2024)   PRAPARE - Administrator, Civil Service (Medical): No    Lack of Transportation (Non-Medical): No  Physical Activity: Inactive (10/24/2017)   Exercise Vital Sign    Days of Exercise per Week: 0 days    Minutes of Exercise per Session: 0 min  Stress: No Stress Concern Present (10/24/2017)   [employer] of Occupational Health - Occupational Stress Questionnaire    Feeling of Stress : Not at all  Social Connections: Moderately Isolated (01/13/2024)   Social Connection and Isolation Panel [NHANES]    Frequency of Communication with Friends and Family: Three times a week    Frequency of Social Gatherings with Friends and Family: More than three times a week    Attends Religious Services: Never    Database administrator or Organizations: No    Attends Banker Meetings: Never    Marital Status: Married  Catering manager Violence: Not At Risk (01/13/2024)  Humiliation, Afraid, Rape, and Kick questionnaire    Fear of Current or Ex-Partner: No    Emotionally Abused: No    Physically Abused: No    Sexually Abused: No    Family History:    Family History  Problem Relation Age of Onset   Liver disease Mother    Heart disease Father    CAD Brother    Cancer Brother    Cancer Brother    Cancer Brother      ROS:  Please see the history of present illness.  Review of Systems  Constitutional: Negative.   HENT: Negative.    Respiratory: Negative.    Cardiovascular: Negative.   Gastrointestinal: Negative.   Musculoskeletal: Negative.   Neurological: Negative.   Psychiatric/Behavioral: Negative.    All other systems reviewed and are negative.    Physical Exam/Data:   Vitals:   01/15/24 1204 01/15/24 1205 01/15/24 1215 01/15/24 1240  BP: 98/62  110/65 116/70  Pulse:   80 86  Resp: (!) 22 (!) 22 20 20   Temp:      TempSrc:      SpO2:   100% 100%  Weight:       Height:        Intake/Output Summary (Last 24 hours) at 01/15/2024 1454 Last data filed at 01/15/2024 0747 Gross per 24 hour  Intake 215.33 ml  Output 200 ml  Net 15.33 ml      01/14/2024   12:09 PM 10/05/2022    3:20 PM 04/12/2022    9:32 AM  Last 3 Weights  Weight (lbs) 210 lb 220 lb 0.3 oz 220 lb  Weight (kg) 95.255 kg 99.8 kg 99.791 kg     Body mass index is 29.29 kg/m.  General: Pale, well nourished, well developed, in no acute distress HEENT: normal Neck: no JVD Vascular: No carotid bruits; Distal pulses 2+ bilaterally Cardiac: Irregularly irregular, no murmur  Lungs:  clear to auscultation bilaterally, no wheezing, rhonchi or rales  Abd: soft, nontender, no hepatomegaly  Ext: no edema Musculoskeletal:  No deformities, BUE and BLE strength normal and equal Skin: warm and dry  Neuro:  CNs 2-12 intact, no focal abnormalities noted Psych:  Normal affect   EKG:  The EKG was personally reviewed and demonstrates:   Atrial fibrillation rate 105 bpm Telemetry  Telemetry was personally reviewed and demonstrates:   Atrial fibrillation rate 102 bpm  Relevant CV Studies:   Laboratory Data:  High Sensitivity Troponin:  No results for input(s): "TROPONINIHS" in the last 720 hours.   Chemistry Recent Labs  Lab 01/13/24 1255 01/14/24 0615 01/14/24 2128 01/15/24 0913  NA 136 137  --  139  K 3.8 3.4*  --  4.6  CL 103 108  --  108  CO2 22 25  --  24  GLUCOSE 134* 98  --  97  BUN 35* 28*  --  20  CREATININE 1.75* 1.61*  --  1.34*  CALCIUM  8.7* 8.0*  --  7.9*  MG  --   --  1.5*  --   GFRNONAA 38* 42*  --  53*  ANIONGAP 11 4*  --  7    Recent Labs  Lab 01/13/24 1255  PROT 6.1*  ALBUMIN 3.3*  AST 17  ALT 7  ALKPHOS 42  BILITOT 1.6*   Lipids No results for input(s): "CHOL", "TRIG", "HDL", "LABVLDL", "LDLCALC", "CHOLHDL" in the last 168 hours.  Hematology Recent Labs  Lab 01/13/24 1255 01/13/24 1737 01/13/24 2124 01/14/24  0615 01/15/24 0913  WBC 7.0  --    --  5.5 5.6  RBC 2.82*  --   --  3.20* 3.54*  HGB 5.7*   < > 7.0* 7.2* 8.0*  HCT 20.6*   < > 23.1* 23.7* 26.9*  MCV 73.0*  --   --  74.1* 76.0*  MCH 20.2*  --   --  22.5* 22.6*  MCHC 27.7*  --   --  30.4 29.7*  RDW 20.5*  --   --  19.9* 20.4*  PLT 296  --   --  200 211   < > = values in this interval not displayed.   Thyroid  No results for input(s): "TSH", "FREET4" in the last 168 hours.  BNPNo results for input(s): "BNP", "PROBNP" in the last 168 hours.  DDimer No results for input(s): "DDIMER" in the last 168 hours.   Radiology/Studies:  No results found.   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 - Plan is for follow-up in surgical clinic early next week to discuss biopsy results - No high risk features concerning for unstable angina - Echocardiogram pending, no strong indication for stress testing If echocardiogram with no notable findings, would be acceptable risk for surgery  Long-term persistent atrial fibrillation Prior cardioversion 2019 normal sinus rhythm restored at that time -Was seen in the hospital June 2023 for anemia, suspected GI bleed, was back in atrial fibrillation at that time - Xarelto  held at discharge, reports that he put himself back on Xarelto  - Did not seek cardiac attention of his atrial fibrillation in follow-up - Remains in atrial fibrillation this admission, unclear if he is paroxysmal or long-term persistent - Recommend continued hold of Xarelto , discussed possible options for anticoagulation.  If felt not to be a good candidate for  long-term anticoagulation, he could meet with the EP for outpatient discussion  for Watchman device - Continue metoprolol  tartrate 25 twice daily for rate control, unable to increase dosing secondary to borderline low blood pressure  Blood loss anemia Presenting hemoglobin 5.7, now in the 8 range after transfusion -Receiving iron infusion - Xarelto  on hold - Has completed EGD and colonoscopy  Alcohol  abuse Cessation recommended Reports 2-4 shots of vodka every night   For questions or updates, please contact [facility] Please consult [email or web address] for contact info under    Signed, Linden Mikes, MD  01/15/2024 2:54 PM

## 2024-01-15 NOTE — Consult Note (Signed)
 Patient ID: Michael Rowe, male   DOB: [birth year], 83 y.o.   MRN: [id number]  HPI Michael Rowe is a 83 y.o. male seen in consultation at the request of Dr.Wohl, his discussed with him in detail.  Really came in with failure to thrive and symptomatic anemia.  The patient reports some intermittent hematochezia around 2 or 3 times prior to admission.  He denies any hematemesis.  He is on chronic anticoagulation for atrial fibs.  He went to the ER where he was found to have a hemoglobin of 5.7.  He has baseline hemoglobin is around 9. He also reports chronic fatigue and some ataxia. He was hospitalized and was given 2 units of blood with great response He Drinks 3-5 mixed drinks a day (vodka and ginger ale) He was found to have an INR of 3.2. Recently he has been struggling w walking  Note that have personally reviewed the colonoscopy images.  Pathology is pending at this time, did also have a CT of the chest abdomen pelvis a couple years ago that I personally reviewed no evidence of significant thoracic or abdominal acute illness He Did have a history of upper GI bleed and had prepyloric ulcer in 2023. Recent endoscopy yesterday failed to reveal any active pathology  HPI  Past Medical History:  Diagnosis Date   AKI (acute kidney injury) (HCC) 09/30/2017   Alcohol  abuse    Atrial fibrillation (HCC)    a. patient reports being diagnosed with A fib ~ 15-20 years prior in [place], [place]; b. noted to be in Afib 1/19; c. CHADS2VASc => 4 (HTN, age x 2, vascular dosease)   CHF (congestive heart failure) (HCC)    HLD (hyperlipidemia)    Hypertension    Macrocytic anemia    Morbid obesity (HCC)     Past Surgical History:  Procedure Laterality Date   CARDIOVERSION N/A 11/14/2017   Procedure: CARDIOVERSION;  Surgeon: Devorah Fonder, MD;  Location: [facility] ORS;  Service: Cardiovascular;  Laterality: N/A;   COLONOSCOPY N/A 01/15/2024   Procedure: COLONOSCOPY;  Surgeon: [name], MD;  Location:  [facility] ENDOSCOPY;  Service: Endoscopy;  Laterality: N/A;   ESOPHAGOGASTRODUODENOSCOPY N/A 01/14/2024   Procedure: EGD (ESOPHAGOGASTRODUODENOSCOPY);  Surgeon: [name], MD;  Location[facility] ENDOSCOPY;  Service: Endoscopy;  Laterality: N/A;   ESOPHAGOGASTRODUODENOSCOPY (EGD) WITH PROPOFOL  N/A 02/28/2022   Procedure: ESOPHAGOGASTRODUODENOSCOPY (EGD) WITH PROPOFOL ;  Surgeon: Shane Darling, MD;  Location: [facility] ENDOSCOPY;  Service: Endoscopy;  Laterality: N/A;   KNEE SURGERY      Family History  Problem Relation Age of Onset   Liver disease Mother    Heart disease Father    CAD Brother    Cancer Brother    Cancer Brother    Cancer Brother     Social History Social History   Tobacco Use   Smoking status: Never   Smokeless tobacco: Never  Vaping Use   Vaping status: Never Used  Substance Use Topics   Alcohol  use: Yes    Alcohol /week: 4.0 - 5.0 standard drinks of alcohol     Types: 3 - 4 Cans of beer, 1 Shots of liquor per week    Comment: last drink yesterday morning 02/27/22   Drug use: No    No Known Allergies  Current Facility-Administered Medications  Medication Dose Route Frequency Provider Last Rate Last Admin   acetaminophen  (TYLENOL ) tablet 650 mg  650 mg Oral Q6H PRN [name], MD   650 mg at 01/14/24 1033   Or  acetaminophen  (TYLENOL ) suppository 650 mg  650 mg Rectal Q6H PRN [name], MD       atorvastatin  (LIPITOR) tablet 40 mg  40 mg Oral Daily [name], MD       cyanocobalamin  (VITAMIN B12) tablet 1,000 mcg  1,000 mcg Oral Daily [name], MD       feeding supplement (ENSURE ENLIVE / ENSURE PLUS) liquid 237 mL  237 mL Oral BID BM [name], MD       folic acid  (FOLVITE ) tablet 1 mg  1 mg Oral Daily Wohl, Darren, MD   1 mg at 01/13/24 1709   hydrALAZINE (APRESOLINE) injection 5 mg  5 mg Intravenous Q6H PRN [name], MD       iron sucrose (VENOFER) 200 mg in sodium chloride  0.9 % 100 mL IVPB  200 mg Intravenous Once Raenette Sakata F, MD        LORazepam  (ATIVAN ) tablet 1-4 mg  1-4 mg Oral Q1H PRN [name], MD       Or   LORazepam  (ATIVAN ) injection 1-4 mg  1-4 mg Intravenous Q1H PRN [name], MD       metoprolol  tartrate (LOPRESSOR ) injection 2.5 mg  2.5 mg Intravenous Q6H PRN [name], MD       metoprolol  tartrate (LOPRESSOR ) tablet 25 mg  25 mg Oral BID [name], MD   25 mg at 01/14/24 2123   multivitamin with minerals tablet 1 tablet  1 tablet Oral Daily [name], MD   1 tablet at 01/13/24 1709   ondansetron  (ZOFRAN ) tablet 4 mg  4 mg Oral Q6H PRN [name], MD       Or   ondansetron  (ZOFRAN ) injection 4 mg  4 mg Intravenous Q6H PRN [name], MD       pantoprazole  (PROTONIX ) injection 40 mg  40 mg Intravenous BID [name], MD   40 mg at 01/14/24 2122   senna-docusate (Senokot-S) tablet 1 tablet  1 tablet Oral QHS PRN [name], MD       thiamine  (VITAMIN B1) tablet 100 mg  100 mg Oral Daily [name], MD   100 mg at 01/13/24 1709   Or   thiamine  (VITAMIN B1) injection 100 mg  100 mg Intravenous Daily [name], MD         Review of Systems Full ROS  was asked and was negative except for the information on the HPI  Physical Exam Blood pressure 116/70, pulse 86, temperature (!) 97 F (36.1 C), temperature source Temporal, resp. rate 20, height 5\' 11"  (1.803 m), weight 95.3 kg, SpO2 100%. CONSTITUTIONAL: chronically ill . EYES: Pupils are equal, round, and reactive to light, Sclera are non-icteric. EARS, NOSE, MOUTH AND THROAT: The oropharynx is clear. The oral mucosa is pink and moist. Hearing is intact to voice. LYMPH NODES:  Lymph nodes in the neck are normal. RESPIRATORY:  Lungs are clear. There is normal respiratory effort, with equal breath sounds bilaterally, and without pathologic use of accessory muscles. CARDIOVASCULAR: Heart is iregular without murmurs, gallops, or rubs. GI: The abdomen is  soft, nontender, and nondistended. There are no palpable masses. There is no  hepatosplenomegaly. There are normal bowel sounds in all quadrants. GU: Rectal deferred.   MUSCULOSKELETAL: Normal muscle strength and tone. No cyanosis or edema.   SKIN: Turgor is good and there are no pathologic skin lesions or ulcers. NEUROLOGIC: Motor and sensation is grossly normal. Cranial nerves are grossly intact. PSYCH:  Oriented to person, place  and time. Affect is normal.  Data Reviewed  I have personally reviewed the patient's imaging, laboratory findings and medical records.    Assessment/Plan 83 year old male with symptomatic anemia chronic debility and workup revealing a sigmoid mass.  Currently he is not having active bleeding or obstruction.  Discussed with the patient and the family in detail about endoscopic findings.  I had also an extensive discussion about further next steps to include appropriate staging, awaiting pathology and performing a preoperative cardiac evaluation. I am puzzled about the fact that his INR is greater than 3 and he is on Xarelto . We may need to d/w hematology about potential explanation for INR elevation, he does not seem to be cirrhotic I am Going to repeat a CBC CMP INR as well as CEA tomorrow   I do think that this likely will require colectomy in an elective fashion.  We talked about the procedure and the risk the benefits and the possible complications.  I do think that he is functional and he wishes to have further therapy for the sigmoid mass. I have contacted cardiology they will see him in consultation.  Will start also IV iron due to likely chronicity of the anemia and the need for more rapid improvement.  Please note that I spent 75 minutes in this encounter including extensive review of medical records, coordinating his care, personally reviewing imaging studies, placing orders and performing documentation No need for urgent intervention at this time .     Evelia Hipp, MD FACS General Surgeon 01/15/2024, 2:29 PM

## 2024-01-15 NOTE — TOC Initial Note (Signed)
 Transition of Care [facility]) - Initial/Assessment Note    Patient Details  Name: Michael Rowe MRN: [id number] Date of Birth: [birth year]  [facility] CM/SW Contact:    Odilia Bennett, LCSW Phone Number: 01/15/2024, 11:40 AM  Clinical Narrative:  CSW met with patient. No family at bedside. CSW introduced role and inquired about interest in SA resources. Patient declined. CSW encouraged him to notify team if he changes his mind prior to discharge. No further concerns. CSW will continue to follow patient for support and facilitate return home once stable. He confirmed he will have a ride home at discharge.                Expected Discharge Plan: Home/Self Care Barriers to Discharge: Continued Medical Work up   Patient Goals and CMS Choice            Expected Discharge Plan and Services     Post Acute Care Choice: NA Living arrangements for the past 2 months: Single Family Home                                      Prior Living Arrangements/Services Living arrangements for the past 2 months: Single Family Home   Patient language and need for interpreter reviewed:: Yes Do you feel safe going back to the place where you live?: Yes      Need for Family Participation in Patient Care: Yes (Comment)     Criminal Activity/Legal Involvement Pertinent to Current Situation/Hospitalization: No - Comment as needed  Activities of Daily Living   ADL Screening (condition at time of admission) Independently performs ADLs?: Yes (appropriate for developmental age) Is the patient deaf or have difficulty hearing?: No Does the patient have difficulty seeing, even when wearing glasses/contacts?: No Does the patient have difficulty concentrating, remembering, or making decisions?: No  Permission Sought/Granted                  Emotional Assessment Appearance:: Appears stated age Attitude/Demeanor/Rapport: Engaged Affect (typically observed): Appropriate, Calm,  Pleasant Orientation: : Oriented to Self, Oriented to Place, Oriented to  Time, Oriented to Situation Alcohol  / Substance Use: Alcohol  Use Psych Involvement: No (comment)  Admission diagnosis:  Symptomatic anemia [D64.9] Gastrointestinal hemorrhage, unspecified gastrointestinal hemorrhage type [K92.2] GI bleed [K92.2] Patient Active Problem List   Diagnosis Date Noted   GI bleed 01/14/2024   Symptomatic anemia 01/13/2024   Obesity (BMI 30-39.9) 02/28/2022   Hypomagnesemia 02/28/2022   ABLA (acute blood loss anemia) 02/27/2022   Depression, major, single episode, in partial remission (HCC) 11/06/2018   Chronic diastolic heart failure (HCC) 10/24/2017   Snoring 10/24/2017   Morbid obesity (HCC) 10/01/2017   Alcohol  abuse 10/01/2017   HTN (hypertension) 09/30/2017   HLD (hyperlipidemia) 09/30/2017   Atrial fibrillation with RVR (HCC) 09/30/2017   AKI (acute kidney injury) (HCC) 09/30/2017   PCP:  Deliah Fells, NP Pharmacy:   [facility] [address] [place] - [address] [place] [address] Phone: [phone] Fax: [phone]  [facility] - [place], [place] - [address] [place] [address] Phone: [phone] Fax: [phone]     Social Drivers of Health (SDOH) Social History: SDOH Screenings   Food Insecurity: No Food Insecurity (01/13/2024)  Housing: Low Risk  (01/13/2024)  Transportation Needs: No Transportation Needs (01/13/2024)  Utilities: Not  At Risk (01/13/2024)  Depression (PHQ2-9): Low Risk  (11/06/2018)  Financial Resource Strain: Low Risk  (02/13/2023)   Received from [facility]  Physical Activity: Inactive (10/24/2017)  Social Connections: Moderately Isolated (01/13/2024)  Stress: No Stress Concern Present (10/24/2017)  Tobacco Use: Low Risk  (01/15/2024)   SDOH Interventions:     Readmission Risk Interventions    03/02/2022   10:32 AM  Readmission  Risk Prevention Plan  Post Dischage Appt Complete  Medication Screening Complete  Transportation Screening Complete

## 2024-01-15 NOTE — Progress Notes (Signed)
 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 Gen surg consulted.   Symptomatic anemia: w/ component of acute blood loss anemia. Possibly secondary to sigmoid mass found on colonoscopy. S/p EGD which showed normal, stomach, normal examined duodenum & fresh blood in the mouth. S/p 1 unit of pRBCs transfused so far.  Will continue to monitor H&H. Drinks approx 3 shots of vodka daily.   AKI: Cr is trending down again. Avoid nephrotoxic meds    HTN: continue on home dose of metoprolol     Alcohol  abuse: drinks 3 shots of vodka daily. Continue on CIWA protocol. Continue on PPI. Received alcohol  cessation counseling    HLD: will restart home statin       DVT prophylaxis: SCDs Code Status: full  Family Communication:  Disposition Plan: likely d/c back home   Level of care: Telemetry Medical  Status is: Inpatient Remains inpatient appropriate because: severity of illness    Consultants:  GI  Gen surg   Procedures:   Antimicrobials:    Subjective: Pt denies any black stools today so far   Objective: Vitals:   01/15/24 1204 01/15/24 1205 01/15/24 1215 01/15/24 1240  BP: 98/62  110/65 116/70  Pulse:   80 86  Resp: (!) 22 (!) 22 20 20   Temp:      TempSrc:      SpO2:   100% 100%  Weight:      Height:        Intake/Output Summary (Last 24 hours) at 01/15/2024 1404 Last data filed at 01/15/2024 0747 Gross per 24 hour  Intake 215.33 ml  Output 200 ml  Net 15.33 ml   Filed Weights   01/14/24 1209   Weight: 95.3 kg    Examination:  General exam: appears comfortable  Respiratory system: clear breath sounds b/l  Cardiovascular system: S1/S2+. No rubs or clicks  Gastrointestinal system: abd is soft, NT, ND & hypoactive bowel sounds  Central nervous system: alert & oriented. Moves all extremities  Psychiatry: Judgement and insight appears at baseline. Flat mood and affect     Data Reviewed: I have personally reviewed following labs and imaging studies  CBC: Recent Labs  Lab 01/13/24 1255 01/13/24 1737 01/13/24 2124 01/14/24 0615 01/15/24 0913  WBC 7.0  --   --  5.5 5.6  HGB 5.7* 6.5* 7.0* 7.2* 8.0*  HCT 20.6* 22.3* 23.1* 23.7* 26.9*  MCV 73.0*  --   --  74.1* 76.0*  PLT 296  --   --  200 211   Basic Metabolic Panel: Recent Labs  Lab 01/13/24 1255 01/14/24 0615 01/14/24 2128 01/15/24 0913  NA 136 137  --  139  K 3.8 3.4*  --  4.6  CL 103 108  --  108  CO2 22 25  --  24  GLUCOSE 134* 98  --  97  BUN 35* 28*  --  20  CREATININE 1.75* 1.61*  --  1.34*  CALCIUM  8.7* 8.0*  --  7.9*  MG  --   --  1.5*  --    GFR: Estimated Creatinine Clearance: 49.2 mL/min (A) (by C-G formula based on SCr of 1.34 mg/dL (H)). Liver Function Tests: Recent Labs  Lab 01/13/24 1255  AST 17  ALT 7  ALKPHOS 42  BILITOT 1.6*  PROT 6.1*  ALBUMIN 3.3*   No results for input(s): "LIPASE", "AMYLASE" in the last 168 hours. No results for input(s): "AMMONIA" in the last 168 hours. Coagulation Profile: Recent Labs  Lab 01/13/24 1255  INR 3.2*   Cardiac Enzymes: No results for input(s): "CKTOTAL", "CKMB", "CKMBINDEX", "TROPONINI" in the last 168 hours. BNP (last 3 results) No results for input(s): "PROBNP" in the last 8760 hours. HbA1C: No results for input(s): "HGBA1C" in the last 72 hours. CBG: No results for input(s): "GLUCAP" in the last 168 hours. Lipid Profile: No results for input(s): "CHOL", "HDL", "LDLCALC", "TRIG", "CHOLHDL", "LDLDIRECT" in the last 72  hours. Thyroid  Function Tests: No results for input(s): "TSH", "T4TOTAL", "FREET4", "T3FREE", "THYROIDAB" in the last 72 hours. Anemia Panel: No results for input(s): "VITAMINB12", "FOLATE", "FERRITIN", "TIBC", "IRON", "RETICCTPCT" in the last 72 hours. Sepsis Labs: No results for input(s): "PROCALCITON", "LATICACIDVEN" in the last 168 hours.  No results found for this or any previous visit (from the past 240 hours).       Radiology Studies: No results found.      Scheduled Meds:  atorvastatin   40 mg Oral Daily   cyanocobalamin   1,000 mcg Oral Daily   feeding supplement  237 mL Oral BID BM   folic acid   1 mg Oral Daily   metoprolol  tartrate  25 mg Oral BID   multivitamin with minerals  1 tablet Oral Daily   pantoprazole  (PROTONIX ) IV  40 mg Intravenous BID   thiamine   100 mg Oral Daily   Or   thiamine   100 mg Intravenous Daily   Continuous Infusions:  iron sucrose        LOS: 1 day       Alphonsus Jeans, MD [facility] Pager [phone]  If 7PM-7AM, please contact night-coverage [email or web address] 01/15/2024, 2:04 PM

## 2024-01-15 NOTE — Transfer of Care (Signed)
 Immediate Anesthesia Transfer of Care Note  Patient: Michael Rowe  Procedure(s) Performed: COLONOSCOPY  Patient Location: PACU and Endoscopy Unit  Anesthesia Type:General  Level of Consciousness: drowsy and patient cooperative  Airway & Oxygen Therapy: Patient Spontanous Breathing  Post-op Assessment: Report given to RN and Post -op Vital signs reviewed and stable  Post vital signs: Reviewed and stable  Last Vitals:  Vitals Value Taken Time  BP 79/56 01/15/24 1155  Temp    Pulse 88 01/15/24 1155  Resp 19 01/15/24 1155  SpO2 95 % 01/15/24 1155  Vitals shown include unfiled device data.  Last Pain:  Vitals:   01/15/24 1155  TempSrc:   PainSc: 0-No pain         Complications: No notable events documented.

## 2024-01-15 NOTE — Evaluation (Signed)
 Physical Therapy Evaluation Patient Details Name: Michael Rowe MRN: [id number] DOB: 11-26-1940 Today's Date: 01/15/2024  History of Present Illness  Patient admitted with symptomatic anemia: w/ component of acute blood loss anemia. Possibly secondary to sigmoid mass found on colonoscopy.  Clinical Impression  Patient received in bed, wife at bedside. He is agreeable to PT assessment. Patient is mod I with bed mobility, transfers with min A. Patient is able to ambulate 120 feet with RW and cga. No lob. Mild fatigue. He will continue to benefit from skilled PT to improve endurance and mobility while here.         If plan is discharge home, recommend the following: A little help with walking and/or transfers;A little help with bathing/dressing/bathroom   Can travel by private vehicle    yes    Equipment Recommendations None recommended by PT  Recommendations for Other Services       Functional Status Assessment Patient has had a recent decline in their functional status and demonstrates the ability to make significant improvements in function in a reasonable and predictable amount of time.     Precautions / Restrictions Precautions Precautions: Fall Recall of Precautions/Restrictions: Intact Restrictions Weight Bearing Restrictions Per Provider Order: No      Mobility  Bed Mobility Overal bed mobility: Modified Independent                  Transfers Overall transfer level: Needs assistance Equipment used: Rolling walker (2 wheels) Transfers: Sit to/from Stand Sit to Stand: Min assist           General transfer comment: trying to pull up on walker, required cues to push from bed    Ambulation/Gait Ambulation/Gait assistance: Contact guard assist Gait Distance (Feet): 120 Feet Assistive device: Rolling walker (2 wheels) Gait Pattern/deviations: Step-through pattern Gait velocity: decr     General Gait Details: B foot drop, cga required for safety.  Mild fatigue with this distance.  Stairs            Wheelchair Mobility     Tilt Bed    Modified Rankin (Stroke Patients Only)       Balance Overall balance assessment: Needs assistance Sitting-balance support: Feet supported Sitting balance-Leahy Scale: Normal     Standing balance support: Bilateral upper extremity supported, During functional activity, Reliant on assistive device for balance Standing balance-Leahy Scale: Good Standing balance comment: light UE support                             Pertinent Vitals/Pain Pain Assessment Pain Assessment: No/denies pain    Home Living Family/patient expects to be discharged to:: Private residence Living Arrangements: Spouse/significant other Available Help at Discharge: Family;Available 24 hours/day Type of Home: House Home Access: Stairs to enter Entrance Stairs-Rails: Right;Left;Can reach both Entrance Stairs-Number of Steps: 6   Home Layout: One level Home Equipment: Agricultural consultant (2 wheels);Cane - single point      Prior Function Prior Level of Function : Independent/Modified Independent;Driving             Mobility Comments: uses cane when out of the house, no AD when inside. 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  Cervical / Trunk Assessment Cervical / Trunk Assessment: Normal  Communication   Communication Communication: No apparent difficulties    Cognition Arousal: Alert Behavior During Therapy: WFL for tasks assessed/performed   PT - Cognitive impairments: No apparent impairments                         Following commands: Intact       Cueing  Cueing Techniques: Verbal cues     General Comments      Exercises     Assessment/Plan    PT Assessment Patient needs continued PT services  PT Problem List Decreased strength;Decreased activity tolerance;Decreased mobility       PT Treatment Interventions Gait training;DME instruction;Stair training;Functional mobility training;Therapeutic activities;Therapeutic exercise;Patient/family education    PT Goals (Current goals can be found in the Care Plan section)  Acute Rehab PT Goals Patient Stated Goal: return home PT Goal Formulation: With patient Time For Goal Achievement: 01/29/24 Potential to Achieve Goals: Good    Frequency Min 2X/week     Co-evaluation               AM-PAC PT "6 Clicks" Mobility  Outcome Measure Help needed turning from your back to your side while in a flat bed without using bedrails?: None Help needed moving from lying on your back to sitting on the side of a flat bed without using bedrails?: None Help needed moving to and from a bed to a chair (including a wheelchair)?: A Little Help needed standing up from a chair using your arms (e.g., wheelchair or bedside chair)?: A Little Help needed to walk in hospital room?: A Little Help needed climbing 3-5 steps with a railing? : A Little 6 Click Score: 20    End of Session Equipment Utilized During Treatment: Gait belt Activity Tolerance: Patient tolerated treatment well Patient left: in bed;with call bell/phone within reach;with bed alarm set;with family/visitor present Nurse Communication: Mobility status PT Visit Diagnosis: Other abnormalities of gait and mobility (R26.89);Muscle weakness (generalized) (M62.81);Difficulty in walking, not elsewhere classified (R26.2)    Time: 1610-9604 PT Time Calculation (min) (ACUTE ONLY): 13 min   Charges:   PT Evaluation $PT Eval Low Complexity: 1 Low   PT General Charges $$ ACUTE PT VISIT: 1 Visit         Oaklen Thiam, PT,  [facility] 01/15/24,3:01 PM

## 2024-01-16 ENCOUNTER — Observation Stay (HOSPITAL_BASED_OUTPATIENT_CLINIC_OR_DEPARTMENT_OTHER)
Admit: 2024-01-16 | Discharge: 2024-01-16 | Disposition: A | Discharge status: Home / Self Care | Attending: Surgery | Admitting: Surgery

## 2024-01-16 DIAGNOSIS — K6389 Other specified diseases of intestine: Secondary | ICD-10-CM

## 2024-01-16 DIAGNOSIS — I5021 Acute systolic (congestive) heart failure: Secondary | ICD-10-CM | POA: Diagnosis not present

## 2024-01-16 DIAGNOSIS — D649 Anemia, unspecified: Secondary | ICD-10-CM | POA: Diagnosis not present

## 2024-01-16 HISTORY — DX: Other specified diseases of intestine: K63.89

## 2024-01-16 LAB — CBC
HCT: 26.5 % — ABNORMAL LOW (ref 39.0–52.0)
Hemoglobin: 7.8 g/dL — ABNORMAL LOW (ref 13.0–17.0)
MCH: 22.7 pg — ABNORMAL LOW (ref 26.0–34.0)
MCHC: 29.4 g/dL — ABNORMAL LOW (ref 30.0–36.0)
MCV: 77 fL — ABNORMAL LOW (ref 80.0–100.0)
Platelets: 204 10*3/uL (ref 150–400)
RBC: 3.44 MIL/uL — ABNORMAL LOW (ref 4.22–5.81)
RDW: 21.3 % — ABNORMAL HIGH (ref 11.5–15.5)
WBC: 7.1 10*3/uL (ref 4.0–10.5)
nRBC: 0 % (ref 0.0–0.2)

## 2024-01-16 LAB — COMPREHENSIVE METABOLIC PANEL WITH GFR
ALT: 6 U/L (ref 0–44)
AST: 17 U/L (ref 15–41)
Albumin: 2.7 g/dL — ABNORMAL LOW (ref 3.5–5.0)
Alkaline Phosphatase: 38 U/L (ref 38–126)
Anion gap: 6 (ref 5–15)
BUN: 14 mg/dL (ref 8–23)
CO2: 23 mmol/L (ref 22–32)
Calcium: 7.7 mg/dL — ABNORMAL LOW (ref 8.9–10.3)
Chloride: 108 mmol/L (ref 98–111)
Creatinine, Ser: 1.31 mg/dL — ABNORMAL HIGH (ref 0.61–1.24)
GFR, Estimated: 54 mL/min — ABNORMAL LOW (ref >60)
Glucose, Bld: 102 mg/dL — ABNORMAL HIGH (ref 70–99)
Potassium: 4.2 mmol/L (ref 3.5–5.1)
Sodium: 137 mmol/L (ref 135–145)
Total Bilirubin: 1.6 mg/dL — ABNORMAL HIGH (ref 0.0–1.2)
Total Protein: 5.2 g/dL — ABNORMAL LOW (ref 6.5–8.1)

## 2024-01-16 LAB — ECHOCARDIOGRAM COMPLETE
AR max vel: 2.52 cm2
AV Area VTI: 3.01 cm2
AV Area mean vel: 2.62 cm2
AV Mean grad: 2 mmHg
AV Peak grad: 4 mmHg
Ao pk vel: 1 m/s
Area-P 1/2: 4.24 cm2
Height: 71 in
S' Lateral: 3.5 cm
Weight: 3360 [oz_av]

## 2024-01-16 LAB — PROTIME-INR
INR: 1.4 — ABNORMAL HIGH (ref 0.8–1.2)
Prothrombin Time: 17.3 s — ABNORMAL HIGH (ref 11.4–15.2)

## 2024-01-16 LAB — MAGNESIUM: Magnesium: 2.1 mg/dL (ref 1.7–2.4)

## 2024-01-16 MED ORDER — FERROUS SULFATE 325 (65 FE) MG PO TBEC
325.0000 mg | DELAYED_RELEASE_TABLET | Freq: Two times a day (BID) | ORAL | Status: AC
Start: 2024-01-16 — End: ?

## 2024-01-16 NOTE — Progress Notes (Signed)
 Physical Therapy Treatment Patient Details Name: Michael Rowe MRN: [id number] DOB: [birth year] Today's Date: 01/16/2024   History of Present Illness Patient admitted with symptomatic anemia: w/ component of acute blood loss anemia. Possibly secondary to sigmoid mass found on colonoscopy.    PT Comments  Patient received in bed, he is pleasant and agrees to PT session. Patient states he is hopefully going home later today. He is mod I with bed mobility and transfers with min A. Lost balance with initial standing this session and sat back down on bed. Patient requires cga/min A for mobility due to unsteadiness. Ambulated 120 feet with RW and cga. Patient appeared more sob with activity this session, O2 sats on room air were >95%. He will continue to benefit from skilled PT to improve endurance, strength and safety with mobility.     If plan is discharge home, recommend the following: A little help with walking and/or transfers;A little help with bathing/dressing/bathroom;Help with stairs or ramp for entrance;Assist for transportation   Can travel by private vehicle      yes  Equipment Recommendations  None recommended by PT    Recommendations for Other Services  Asked about HHPT, he declined.     Precautions / Restrictions Precautions Precautions: Fall Recall of Precautions/Restrictions: Intact Restrictions Weight Bearing Restrictions Per Provider Order: No     Mobility  Bed Mobility Overal bed mobility: Modified Independent                  Transfers Overall transfer level: Needs assistance Equipment used: Rolling walker (2 wheels) Transfers: Sit to/from Stand Sit to Stand: Min assist           General transfer comment: Patient lost balance with initial standing and sat back down on bed. Cues needed for hand placement and to take his time.    Ambulation/Gait Ambulation/Gait assistance: Contact guard assist, Min assist Gait Distance (Feet): 120  Feet Assistive device: Rolling walker (2 wheels) Gait Pattern/deviations: Step-through pattern, Decreased dorsiflexion - right, Decreased dorsiflexion - left Gait velocity: decr     General Gait Details: B foot drop, cga required for safety. Mild fatigue with this distance. Seemd to have increased sob with mobility this session, O2 sats on room air >95%   Stairs             Wheelchair Mobility     Tilt Bed    Modified Rankin (Stroke Patients Only)       Balance Overall balance assessment: Needs assistance Sitting-balance support: Feet supported Sitting balance-Leahy Scale: Normal     Standing balance support: Bilateral upper extremity supported, During functional activity, Reliant on assistive device for balance Standing balance-Leahy Scale: Good Standing balance comment: light UE support                            Communication Communication Communication: No apparent difficulties  Cognition Arousal: Alert Behavior During Therapy: WFL for tasks assessed/performed                             Following commands: Intact      Cueing Cueing Techniques: Verbal cues  Exercises      General Comments        Pertinent Vitals/Pain Pain Assessment Pain Assessment: No/denies pain    Home Living  Prior Function            PT Goals (current goals can now be found in the care plan section) Acute Rehab PT Goals Patient Stated Goal: return home PT Goal Formulation: With patient Time For Goal Achievement: 01/29/24 Potential to Achieve Goals: Good Progress towards PT goals: Progressing toward goals    Frequency    Min 2X/week      PT Plan      Co-evaluation              AM-PAC PT "6 Clicks" Mobility   Outcome Measure  Help needed turning from your back to your side while in a flat bed without using bedrails?: None Help needed moving from lying on your back to sitting on the side of a  flat bed without using bedrails?: None Help needed moving to and from a bed to a chair (including a wheelchair)?: A Little Help needed standing up from a chair using your arms (e.g., wheelchair or bedside chair)?: A Little Help needed to walk in hospital room?: A Little Help needed climbing 3-5 steps with a railing? : A Little 6 Click Score: 20    End of Session   Activity Tolerance: Patient tolerated treatment well Patient left: in bed;with call bell/phone within reach;with bed alarm set Nurse Communication: Mobility status PT Visit Diagnosis: Other abnormalities of gait and mobility (R26.89);Muscle weakness (generalized) (M62.81);Difficulty in walking, not elsewhere classified (R26.2);Unsteadiness on feet (R26.81)     Time: 6962-9528 PT Time Calculation (min) (ACUTE ONLY): 13 min  Charges:    $Gait Training: 8-22 mins PT General Charges $$ ACUTE PT VISIT: 1 Visit                     Davin Archuletta, PT, [facility] 01/16/24,12:30 PM

## 2024-01-16 NOTE — Progress Notes (Signed)
 This patient had an EGD and colonoscopy with a colon mass seen and some bleeding from his mouth.  The patient has been seen by surgery and cardiology.  Nothing further to do from a GI point of view at this time.  I will sign off.  Please call if any further GI concerns or questions.  We would like to thank you for the opportunity to participate in the care of Michael Rowe.

## 2024-01-16 NOTE — Progress Notes (Signed)
*  PRELIMINARY RESULTS* Echocardiogram 2D Echocardiogram has been performed.  Michael Rowe 01/16/2024, 9:59 AM

## 2024-01-16 NOTE — Discharge Summary (Signed)
 Physician Discharge Summary  Gad Emge Akerley WJX:[id number] DOB: 04/02/1941 DOA: 01/13/2024  PCP: Deliah Fells, NP  Admit date: 01/13/2024 Discharge date: 01/16/2024  Admitted From: home  Disposition:  home   Recommendations for Outpatient Follow-up:  Follow up with PCP in 1-2 weeks F/u w/ gen surg, Dr. Dana Duncan, within 1 week  Home Health: no  Equipment/Devices:  Discharge Condition: stable  CODE STATUS: full  Diet recommendation:  Regular  Brief/Interim Summary: HPI was taken from Dr. Reinhold Carbine:  Michael Rowe is an 83 year old male with history of physical deconditioning, B12 deficiency, chronic alcohol  use, iron deficiency anemia, chronic anemia, who presents emergency department for chief concerns of abnormal labs, and generalized weakness.   Vitals in the ED showed t of 97.8, rr 27, heart rate 120, blood pressure 117/71, SpO2 96% on room air.   Serum sodium is 136, potassium 3.8, chloride 103, bicarb 22, BUN of 35, serum creatinine 1.75, EGFR 38, T. bili was elevated at 1.6.   WBC 7.0, hemoglobin 5.7, platelets of 296.   ED treatment: Patient has been typed and screened.  1 unit of PRBC have been ordered for transfusion. ---------------------------------- At bedside, patient was able to tell me his name, age, location, current calendar year.   He reports that he has been having weakness, fatigue, shortness of breath with exertion for many many months however it has been worsening over the last 1 to 2 weeks.   He reports that his stool is usually brown however sometimes it is black and sometimes it has streaks of blood.  He denies nausea and vomiting.  He denies trauma to his person.   He denies over-the-counter NSAID use including BC powder, Goody powder, ibuprofen, Motrin.  He reports he drinks alcohol  daily.  He reports he drinks about 2-3 or 4 shots of vodka every night.  His last drink was yesterday.    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 Gen surg will f/u outpatient w/ pt on 12/22/23 after pathology results are back. Will ikely need sigmoid colectomy as per gen surg    Symptomatic anemia: w/ component of acute blood loss anemia. Possibly secondary to sigmoid mass found on colonoscopy. S/p EGD which showed normal, stomach, normal examined duodenum & fresh blood in the mouth. S/p 1 unit of pRBCs transfused so far.  Will continue to monitor H&H. Drinks approx 3 shots of vodka daily. Continue on iron supplements   AKI: Cr is trending down daily.    HTN: continue on home dose of metoprolol     Alcohol  abuse: drinks 3 shots of vodka daily. Continue on CIWA protocol. Continue on PPI. Received alcohol  cessation counseling    HLD: will restart home statin   Persistent a. fib: as per cardio. Continue on metoprolol . Holding xarelto     Discharge Instructions  Discharge Instructions     Diet general   Complete by: As directed    Discharge instructions   Complete by: As directed    F/u w/ gen surg, Dr. Dana Duncan, within 1 week. F/u w/ PCP in 1-2 weeks. Do not take xarelto   until Dr. Dana Duncan tells you it is ok to restart taking this medication   Increase activity slowly   Complete by: As directed       Allergies as of 01/16/2024   No Known Allergies      Medication List     PAUSE taking these medications    Xarelto  20 MG Tabs tablet Wait to take this until: February 16, 2024 Generic drug: rivaroxaban  Take 1 tablet by mouth daily.       TAKE these medications    atorvastatin  40 MG tablet Commonly known as: LIPITOR Take 1 tablet (40 mg total) by  mouth daily. Please call for an appointment for more refills   cyanocobalamin  1000 MCG tablet Commonly known as: VITAMIN B12 Take 1,000 mcg by mouth daily.   ferrous sulfate 325 (65 FE) MG EC tablet Take 1 tablet (325 mg total) by mouth 2 (two) times daily.   folic acid  1 MG tablet Commonly known as: FOLVITE  Take 1 mg by mouth daily.   furosemide  20 MG tablet Commonly known as: Lasix  Take 1 tablet (20 mg total) by mouth daily as needed (swelling).   Klor-Con  M20 20 MEQ tablet Generic drug: potassium chloride  SA Take 20 mEq by mouth daily.   metoprolol  tartrate 25 MG tablet Commonly known as: LOPRESSOR  Take 1 tablet (25 mg total) by mouth 2 (two) times daily.   mirtazapine 15 MG tablet Commonly known as: REMERON Take 1 tablet by mouth at bedtime.   pantoprazole  40 MG tablet Commonly known as: Protonix  Take 1 tablet (40 mg total) by mouth 2 (two) times daily.        Follow-up Information     [place], [place], MD Follow up on 01/21/2024.   Specialty: General Surgery Why: colon CA Contact information: [address] [place] [address] [phone]         Deliah Fells, NP Follow up.   Specialty: Internal Medicine Why: F/u in 1-2 weeks Contact information: [address] [place] [address] [phone]                No Known Allergies  Consultations: Gen surg GI  Cardio    Procedures/Studies: ECHOCARDIOGRAM COMPLETE Result Date: 01/16/2024    ECHOCARDIOGRAM REPORT   Patient Name:   Michael Rowe Date of Exam: 01/16/2024 Medical Rec #:  [id number]           Height:       71.0 in Accession #:    [id number]          Weight:       210.0 lb Date of Birth:  07/04/1941           BSA:          2.153 m Patient Age:    82 years            BP:           111/68 mmHg Patient Gender: M                   HR:           84 bpm. Exam Location:  [facility] Procedure: 2D Echo, Color Doppler and Cardiac Doppler (Both Spectral and Color             Flow Doppler were utilized during procedure). Indications:     CHF---acute systolic I50.21  History:         Patient has prior history of Echocardiogram examinations,  most                  recent 10/01/2017. CHF, Arrythmias:Atrial Fibrillation; Risk                  Factors:Hypertension.  Sonographer:     Broadus Canes Referring Phys:  [id number] DIEGO F PABON Diagnosing Phys: Constancia Delton MD IMPRESSIONS  1. Left ventricular ejection fraction, by estimation, is 55 to 60%. The left ventricle has normal function. The left ventricle has no regional wall motion abnormalities. There is mild left ventricular hypertrophy. Left ventricular diastolic parameters are indeterminate.  2. Right ventricular systolic function is normal. The right ventricular size is moderately enlarged.  3. Left atrial size was moderately dilated.  4. Right atrial size was severely dilated.  5. The mitral valve is normal in structure. Mild mitral valve regurgitation.  6. The aortic valve is tricuspid. Aortic valve regurgitation is not visualized.  7. Aortic dilatation noted. There is mild dilatation of the aortic root, measuring 40 mm. FINDINGS  Left Ventricle: Left ventricular ejection fraction, by estimation, is 55 to 60%. The left ventricle has normal function. The left ventricle has no regional wall motion abnormalities. The left ventricular internal cavity size was normal in size. There is  mild left ventricular hypertrophy. Left ventricular diastolic parameters are indeterminate. Right Ventricle: The right ventricular size is moderately enlarged. No increase in right ventricular wall thickness. Right ventricular systolic function is normal. Left Atrium: Left atrial size was moderately dilated. Right Atrium: Right atrial size was severely dilated. Pericardium: There is no evidence of pericardial effusion. Mitral Valve: The mitral valve is normal in structure. Mild mitral valve regurgitation. Tricuspid Valve: The tricuspid valve is normal  in structure. Tricuspid valve regurgitation is mild. Aortic Valve: The aortic valve is tricuspid. Aortic valve regurgitation is not visualized. Aortic valve mean gradient measures 2.0 mmHg. Aortic valve peak gradient measures 4.0 mmHg. Aortic valve area, by VTI measures 3.01 cm. Pulmonic Valve: The pulmonic valve was normal in structure. Pulmonic valve regurgitation is not visualized. Aorta: Aortic dilatation noted. There is mild dilatation of the aortic root, measuring 40 mm. IAS/Shunts: No atrial level shunt detected by color flow Doppler.  LEFT VENTRICLE PLAX 2D LVIDd:         4.70 cm LVIDs:         3.50 cm LV PW:         1.50 cm LV IVS:        1.30 cm LVOT diam:     2.20 cm LV SV:         51 LV SV Index:   24 LVOT Area:     3.80 cm  RIGHT VENTRICLE RV Basal diam:  5.50 cm RV Mid diam:    4.80 cm LEFT ATRIUM             Index        RIGHT ATRIUM           Index LA diam:        4.60 cm 2.14 cm/m   RA Area:     32.00 cm LA Vol (A2C):   96.7 ml 44.92 ml/m  RA Volume:   123.00 ml 57.14 ml/m LA Vol (A4C):   81.0 ml 37.63 ml/m LA Biplane Vol: 92.2 ml 42.83 ml/m  AORTIC VALVE AV Area (Vmax):    2.52 cm AV Area (Vmean):   2.62 cm AV Area (VTI):     3.01 cm AV Vmax:  100.00 cm/s AV Vmean:          64.200 cm/s AV VTI:            0.169 m AV Peak Grad:      4.0 mmHg AV Mean Grad:      2.0 mmHg LVOT Vmax:         66.30 cm/s LVOT Vmean:        44.300 cm/s LVOT VTI:          0.134 m LVOT/AV VTI ratio: 0.79  AORTA Ao Root diam: 4.00 cm MITRAL VALVE                TRICUSPID VALVE MV Area (PHT): 4.24 cm     TR Peak grad:   42.5 mmHg MV Decel Time: 179 msec     TR Vmax:        326.00 cm/s MV E velocity: 127.00 cm/s                             SHUNTS                             Systemic VTI:  0.13 m                             Systemic Diam: 2.20 cm Constancia Delton MD Electronically signed by Constancia Delton MD Signature Date/Time: 01/16/2024/1:17:49 PM    Final    CT ABDOMEN PELVIS W CONTRAST Result Date:  01/15/2024 CLINICAL DATA:  Follow-up colon cancer colon mass on endoscopy EXAM: CT ABDOMEN AND PELVIS WITH CONTRAST TECHNIQUE: Multidetector CT imaging of the abdomen and pelvis was performed using the standard protocol following bolus administration of intravenous contrast. RADIATION DOSE REDUCTION: This exam was performed according to the departmental dose-optimization program which includes automated exposure control, adjustment of the mA and/or kV according to patient size and/or use of iterative reconstruction technique. CONTRAST:  OMNIPAQUE  IOHEXOL  300 MG/ML SOLN, 30mL OMNIPAQUE  IOHEXOL  300 MG/ML SOLN COMPARISON:  CT 02/27/2022 FINDINGS: Lower chest: Lung bases demonstrate small bilateral pleural effusions. Cardiomegaly and coronary vascular calcifications. Biatrial enlargement. Hepatobiliary: No focal liver abnormality is seen. No gallstones, gallbladder wall thickening, or biliary dilatation. Pancreas: Unremarkable. No pancreatic ductal dilatation or surrounding inflammatory changes. Spleen: Normal in size without focal abnormality. Adrenals/Urinary Tract: Adrenal glands are normal. Kidneys show no hydronephrosis. Cyst off the mid to upper right kidney, no imaging follow-up is recommended. 2 cm indeterminate exophytic lesion off the mid to lower left kidney, series 2, image 36, slightly increased compared to prior measured of 13 mm. Kidneys show no hydronephrosis. The bladder is slightly thick walled with multiple diverticula. Stomach/Bowel: Moderate gastric distension. No dilated small bowel. Negative appendix. Diverticular disease of the colon. Focal wall thickening with some surrounding stranding at the sigmoid colon, series 5, image 33 and series 2, image 65 through 67. No perforation or abscess. Vascular/Lymphatic: Aortic atherosclerosis. No enlarged abdominal or pelvic lymph nodes. Reproductive: Negative prostate Other: Negative for pelvic effusion or free air Musculoskeletal: Fatty atrophy of  the pelvic muscles. No suspicious bone lesions. Chronic compression deformities T11, T12 and L1. IMPRESSION: 1. Diverticular disease of the colon. Focal wall thickening with some surrounding stranding at the sigmoid colon, this presumably corresponds to the history of sigmoid colon mass on colonoscopy, correlate with biopsy results. Focal diverticulitis is included in  the differential as it could also produce this imaging appearance. There is no evidence for perforation or abscess. 2. 2 cm indeterminate exophytic lesion off the mid to lower left kidney, slightly increased compared to prior measured of 13 mm. When the patient is clinically stable and able to follow directions and hold their breath (preferably as an outpatient) further evaluation with dedicated abdominal MRI should be considered. 3. Small bilateral pleural effusions. Cardiomegaly. 4. Thick-walled urinary bladder with multiple diverticula. 5. Aortic atherosclerosis. Aortic Atherosclerosis (ICD10-I70.0). Electronically Signed   By: Esmeralda Hedge M.D.   On: 01/15/2024 22:18   (Echo, Carotid, EGD, Colonoscopy, ERCP)    Subjective: Pt c/o fatigue    Discharge Exam: Vitals:   01/16/24 0456 01/16/24 0829  BP: (!) 99/58 111/68  Pulse: 85 84  Resp: 19 18  Temp: (!) 97.4 F (36.3 C) 97.9 F (36.6 C)  SpO2: 99% 100%   Vitals:   01/15/24 1936 01/15/24 2237 01/16/24 0456 01/16/24 0829  BP: 108/66 105/61 (!) 99/58 111/68  Pulse: 98 84 85 84  Resp: 19  19 18   Temp: 97.9 F (36.6 C)  (!) 97.4 F (36.3 C) 97.9 F (36.6 C)  TempSrc: Oral  Oral Oral  SpO2: 100%  99% 100%  Weight:      Height:        General: Pt is alert, awake, not in acute distress Cardiovascular:  irregularly irregular, no rubs, no gallops Respiratory: CTA bilaterally, no wheezing, no rhonchi Abdominal: Soft, NT, ND, bowel sounds + Extremities: no cyanosis    The results of significant diagnostics from this hospitalization (including imaging, microbiology,  ancillary and laboratory) are listed below for reference.     Microbiology: No results found for this or any previous visit (from the past 240 hours).   Labs: BNP (last 3 results) No results for input(s): "BNP" in the last 8760 hours. Basic Metabolic Panel: Recent Labs  Lab 01/13/24 1255 01/14/24 0615 01/14/24 2128 01/15/24 0913 01/16/24 0613  NA 136 137  --  139 137  K 3.8 3.4*  --  4.6 4.2  CL 103 108  --  108 108  CO2 22 25  --  24 23  GLUCOSE 134* 98  --  97 102*  BUN 35* 28*  --  20 14  CREATININE 1.75* 1.61*  --  1.34* 1.31*  CALCIUM  8.7* 8.0*  --  7.9* 7.7*  MG  --   --  1.5*  --  2.1   Liver Function Tests: Recent Labs  Lab 01/13/24 1255 01/16/24 0613  AST 17 17  ALT 7 6  ALKPHOS 42 38  BILITOT 1.6* 1.6*  PROT 6.1* 5.2*  ALBUMIN 3.3* 2.7*   No results for input(s): "LIPASE", "AMYLASE" in the last 168 hours. No results for input(s): "AMMONIA" in the last 168 hours. CBC: Recent Labs  Lab 01/13/24 1255 01/13/24 1737 01/13/24 2124 01/14/24 0615 01/15/24 0913 01/16/24 0613  WBC 7.0  --   --  5.5 5.6 7.1  HGB 5.7* 6.5* 7.0* 7.2* 8.0* 7.8*  HCT 20.6* 22.3* 23.1* 23.7* 26.9* 26.5*  MCV 73.0*  --   --  74.1* 76.0* 77.0*  PLT 296  --   --  200 211 204   Cardiac Enzymes: No results for input(s): "CKTOTAL", "CKMB", "CKMBINDEX", "TROPONINI" in the last 168 hours. BNP: Invalid input(s): "POCBNP" CBG: No results for input(s): "GLUCAP" in the last 168 hours. D-Dimer No results for input(s): "DDIMER" in the last 72 hours. Hgb A1c No  results for input(s): "HGBA1C" in the last 72 hours. Lipid Profile No results for input(s): "CHOL", "HDL", "LDLCALC", "TRIG", "CHOLHDL", "LDLDIRECT" in the last 72 hours. Thyroid  function studies No results for input(s): "TSH", "T4TOTAL", "T3FREE", "THYROIDAB" in the last 72 hours.  Invalid input(s): "FREET3" Anemia work up No results for input(s): "VITAMINB12", "FOLATE", "FERRITIN", "TIBC", "IRON", "RETICCTPCT" in the last  72 hours. Urinalysis    Component Value Date/Time   COLORURINE YELLOW (A) 02/27/2022 2228   APPEARANCEUR CLEAR (A) 02/27/2022 2228   APPEARANCEUR Clear 11/06/2018 1447   LABSPEC 1.041 (H) 02/27/2022 2228   PHURINE 5.0 02/27/2022 2228   GLUCOSEU NEGATIVE 02/27/2022 2228   HGBUR NEGATIVE 02/27/2022 2228   BILIRUBINUR NEGATIVE 02/27/2022 2228   BILIRUBINUR Negative 11/06/2018 1447   KETONESUR NEGATIVE 02/27/2022 2228   PROTEINUR NEGATIVE 02/27/2022 2228   NITRITE NEGATIVE 02/27/2022 2228   LEUKOCYTESUR NEGATIVE 02/27/2022 2228   Sepsis Labs Recent Labs  Lab 01/13/24 1255 01/14/24 0615 01/15/24 0913 01/16/24 0613  WBC 7.0 5.5 5.6 7.1   Microbiology No results found for this or any previous visit (from the past 240 hours).   Time coordinating discharge: Over 30 minutes  SIGNED:   Alphonsus Jeans, MD  [facility] 01/16/2024, 1:40 PM Pager   If 7PM-7AM, please contact night-coverage [email or web address]

## 2024-01-16 NOTE — Care Management Important Message (Signed)
 Important Message  Patient Details  Name: Michael Rowe MRN: [id number] Date of Birth: 08/29/1941   Important Message Given:  Yes - Medicare IM     Anise Kerns 01/16/2024, 1:17 PM

## 2024-01-16 NOTE — Progress Notes (Signed)
 CC: Sigmoid mass Subjective: Went well.  Tolerated diet.  No hematemesis or melena.  No evidence of active GI bleed.  Hemoglobin is stable. I did give him IV iron empirically and vitamin K.  INR is now 1.4 Discussed with Dr. Randy Buttery  CT personally reviewed no evidence of distant metastatic disease, evidence of diverticulosis in left small kidney nodule  Objective: Vital signs in last 24 hours: Temp:  [96.7 F (35.9 C)-97.9 F (36.6 C)] 97.9 F (36.6 C) (05/16 0829) Pulse Rate:  [80-98] 84 (05/16 0829) Resp:  [16-22] 18 (05/16 0829) BP: (80-119)/(49-73) 111/68 (05/16 0829) SpO2:  [95 %-100 %] 100 % (05/16 0829) Last BM Date : 01/15/24  Intake/Output from previous day: 05/15 0701 - 05/16 0700 In: 180 [I.V.:20; IV Piggyback:160] Out: 450 [Urine:450] Intake/Output this shift: No intake/output data recorded.  Physical exam:  NAD alert ABd: soft, nt Ext: well perfused and warm Neuro: awake and alert, gcs 15 no motor or sen deficits  Lab Results: CBC  Recent Labs    01/15/24 0913 01/16/24 0613  WBC 5.6 7.1  HGB 8.0* 7.8*  HCT 26.9* 26.5*  PLT 211 204   BMET Recent Labs    01/15/24 0913 01/16/24 0613  NA 139 137  K 4.6 4.2  CL 108 108  CO2 24 23  GLUCOSE 97 102*  BUN 20 14  CREATININE 1.34* 1.31*  CALCIUM  7.9* 7.7*   PT/INR Recent Labs    01/13/24 1255 01/16/24 0613  LABPROT 32.8* 17.3*  INR 3.2* 1.4*   ABG No results for input(s): "PHART", "HCO3" in the last 72 hours.  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 RADIATION DOSE REDUCTION: This exam was performed according to the departmental dose-optimization program which includes  automated exposure control, adjustment of the mA and/or kV according to patient size and/or use of iterative reconstruction technique. CONTRAST:  OMNIPAQUE  IOHEXOL  300 MG/ML SOLN, 30mL OMNIPAQUE  IOHEXOL  300 MG/ML SOLN COMPARISON:  CT 02/27/2022 FINDINGS: Lower chest: Lung bases demonstrate small bilateral pleural effusions. Cardiomegaly and coronary vascular calcifications. Biatrial enlargement. Hepatobiliary: No focal liver abnormality is seen. No gallstones, gallbladder wall thickening, or biliary dilatation. Pancreas: Unremarkable. No pancreatic ductal dilatation or surrounding inflammatory changes. Spleen: Normal in size without focal abnormality. Adrenals/Urinary Tract: Adrenal glands are normal. Kidneys show no hydronephrosis. Cyst off the mid to upper right kidney, no imaging follow-up is recommended. 2 cm indeterminate exophytic lesion off the mid to lower left kidney, series 2, image 36, slightly increased compared to prior measured of 13 mm. Kidneys show no hydronephrosis. The bladder is slightly thick walled with multiple diverticula. Stomach/Bowel: Moderate gastric distension. No dilated small bowel. Negative appendix. Diverticular disease of the colon. Focal wall thickening with some surrounding stranding at the sigmoid colon, series 5, image 33 and series 2, image 65 through 67. No perforation or abscess. Vascular/Lymphatic: Aortic atherosclerosis. No enlarged abdominal or pelvic lymph nodes. Reproductive: Negative prostate Other: Negative for pelvic effusion or free air Musculoskeletal: Fatty atrophy of the pelvic muscles. No suspicious bone lesions. Chronic compression deformities T11, T12 and L1. IMPRESSION: 1. Diverticular disease of the colon. Focal wall thickening with some surrounding stranding at the sigmoid colon, this presumably corresponds to the history of sigmoid colon mass on  colonoscopy, correlate with biopsy results. Focal diverticulitis is included in the differential as it could  also produce this imaging appearance. There is no evidence for perforation or abscess. 2. 2 cm indeterminate exophytic lesion off the mid to lower left kidney, slightly increased compared to prior measured of 13 mm. When the patient is clinically stable and able to follow directions and hold their breath (preferably as an outpatient) further evaluation with dedicated abdominal MRI should be considered. 3. Small bilateral pleural effusions. Cardiomegaly. 4. Thick-walled urinary bladder with multiple diverticula. 5. Aortic atherosclerosis. Aortic Atherosclerosis (ICD10-I70.0). Electronically Signed   By: Esmeralda Hedge M.D.   On: 01/15/2024 22:18    Anti-infectives: Anti-infectives (From admission, onward)    None       Assessment/Plan:  Sigmoid colon mass highly suspicious for carcinoma. Will see him back next Wednesday once we have pathology. Alcohol  abuse.  Extensive counseling provided.  I do think that alcohol  abuse is the cause of his increased INR.  He seems to be understanding. No obvious evidence of metastatic disease based on CT scan. Currently tolerating diet ambulating and back to baseline. I had an extensive discussion with them about potential robotic sigmoid colectomy.  Risk the benefits and the possible complications. Please note that I spent 50 minutes in this encounter including extensive review of medical records, personally reviewing imaging studies, coordinating his care, placing orders and performing documentation   Evelia Hipp, MD, FACS  01/16/2024

## 2024-01-16 NOTE — Progress Notes (Signed)
PIV removed. AVS reviewed. Patient verbalizes understanding of necessary follow up appointments.

## 2024-01-17 LAB — CEA: CEA: 1.8 ng/mL (ref 0.0–4.7)

## 2024-01-19 LAB — SURGICAL PATHOLOGY

## 2024-01-21 ENCOUNTER — Telehealth: Payer: Self-pay | Admitting: Surgery

## 2024-01-21 ENCOUNTER — Ambulatory Visit: Admitting: Surgery

## 2024-01-21 ENCOUNTER — Encounter: Payer: Self-pay | Admitting: Surgery

## 2024-01-21 VITALS — BP 111/71 | HR 112 | Temp 97.8°F | Ht 70.5 in | Wt 212.2 lb

## 2024-01-21 DIAGNOSIS — C187 Malignant neoplasm of sigmoid colon: Secondary | ICD-10-CM | POA: Diagnosis not present

## 2024-01-21 MED ORDER — NEOMYCIN SULFATE 500 MG PO TABS: ORAL_TABLET | ORAL | 0 refills | Status: DC

## 2024-01-21 MED ORDER — METRONIDAZOLE 500 MG PO TABS: ORAL_TABLET | ORAL | 0 refills | Status: DC

## 2024-01-21 MED ORDER — POLYETHYLENE GLYCOL 3350 17 GM/SCOOP PO POWD: ORAL | 0 refills | Status: DC

## 2024-01-21 NOTE — Telephone Encounter (Signed)
 Patient has been advised of Pre-Admission date/time, and Surgery date at [facility].  Surgery Date: 01/27/24 Preadmission Testing Date: In person visit on 01/23/24 @ 11:00 am as will need site markings for possible ostomy. Patient is informed of this.  Patient informed of the scheduling process and surgery information given at time of office visit.   Patient has been made aware to call [phone], between 1-3:00pm the day before surgery, to find out what time to arrive for surgery.

## 2024-01-21 NOTE — Patient Instructions (Addendum)
 Colon Surgery Prep  Your surgery is scheduled for 01/27/2024 Your last solid meal should be on 01/24/2024 (3 days before surgery) You should start a Clear liquid diet on 01/25/2024 (2 days before surgery) Continue clear liquid diet on 01/25/2026  A clear diet is anything you can see through(coffee, tea, coke, soda, ginger ale, water, Gatorade,apple juice, chicken or beef broth, jello with no fruit, popsicles, italian ice, etc.). No milk, creamers, and no Red liquids.                                                      Do Not Drink any RED Liquids  ** Be sure to pick up your Dulcolax, Miralax, Neomycin, Metronidazole and 64 oz bottle of Gatorade, no red. **  -Your medications have been called into [employer].   The day before surgery: 01/26/2024  - At 8:00 am, take all 4 Dulcolax tablets(5 mg each) along with 2 Neomycin tablets and 2 Metronidazole tablets.  - At 2:00 pm take 2 Neomycin tablets and 2 Metronidazole tablets. Also you will start your Miralax prep. Mix the entire bottle of Miralax into the 64 oz of Gatorade and mix well. Drink an 8oz(1 cup) glass of the mixture every 15-20 minutes until it is all gone.   -At 8:00 pm, take the last 2 tablets of Neomycin and 2 tablets of Metronidazole. -You may continue your clear liquid diet until midnight. After midnight follow the directions from Pre Admit testing as to what you may have and when to stop drinking.   *If you are having a colostomy reversal, use 1 Fleets enema rectally 2-3 days prior to your surgery date.    We have discussed removing a portion of your damaged colon through 4 small incisions today. We will schedule this surgery at [facility] with Dr. Dana Duncan. Please plan a hospital stay of 3-7 days for surgery and recovery time.  If you are on any injectable weight loss medication, you will need to stop taking your GLP-1 injectable (weight loss) medications 8 days before your surgery to avoid any complications with anesthesia.    We will have you complete a bowel prep prior to your surgery. Please see information provided.  You have also been given a (Blue) Pre-Care Sheet with more information regarding your particular surgery. Our surgery scheduler will call you to verify surgery date and to go over information.  Please review all information given.  You will need to arrange to be out of work for approximately 2 weeks and then you may return with a lifting restriction for 4 more weeks. If you have FMLA or Disability paperwork that needs to be filled out, please have your company fax your paperwork to [phone] or you may drop this by either office. This paperwork will be filled out within 3 days after your surgery has been completed.  Please call our office with any questions or concerns prior to your scheduled surgery.   Laparoscopic Colectomy Laparoscopic colectomy is surgery to remove part or all of the large intestine (colon). This procedure may be used to treat several conditions, including: Inflammation and infection of the colon (diverticulitis). Tumors or masses in the colon. Inflammatory bowel disease, such as Crohn disease or ulcerative colitis. Colectomy is an option when symptoms cannot be controlled with medicines. Bleeding from the colon that  cannot be controlled by another method. Blockage or obstruction of the colon.  Tell a health care provider about: Any allergies you have. All medicines you are taking, including vitamins, herbs, eye drops, creams, and over-the-counter medicines. Any problems you or family members have had with anesthetic medicines. Any blood disorders you have. Any surgeries you have had. Any medical conditions you have. What are the risks? Generally, this is a safe procedure. However, problems may occur, including: Infection. Bleeding. Allergic reactions to medicines or dyes. Damage to other structures or organs. Leaking from where the colon was sewn  together. Future blockage of the small intestines from scar tissue. Another surgery may be needed to repair this. Needing to convert to an open procedure. Complications such as damage to other organs or excessive bleeding may require the surgeon to convert from a laparoscopic procedure to an open procedure. This involves making a larger incision in the abdomen.  What happens before the procedure?  Medicines Ask your health care provider about: Changing or stopping your regular medicines. This is especially important if you are taking diabetes medicines or blood thinners. Taking medicines such as aspirin and ibuprofen. These medicines can thin your blood. Do not take these medicines before your procedure if your health care provider instructs you not to. You may be given antibiotic medicine to clean out bacteria from your colon. Follow the directions carefully and take the medicine at the correct time. General instructions You may be prescribed an oral bowel prep to clean out your colon in preparation for the surgery: Follow instructions from your health care provider about how to do this. Do not eat or drink anything else after you have started the bowel prep, unless your health care provider tells you it is safe to do so. Do not use any products that contain nicotine or tobacco, such as cigarettes and e-cigarettes. If you need help quitting, ask your health care provider. What happens during the procedure? To reduce your risk of infection: Your health care team will wash or sanitize their hands. Your skin will be washed with soap. An IV tube will be inserted into one of your veins to deliver fluid and medication. You will be given one of the following: A medicine to help you relax (sedative). A medicine to make you fall asleep (general anesthetic). Small monitors will be connected to your body. They will be used to check your heart, blood pressure, and oxygen level. A breathing tube may be  placed into your lungs during the procedure. A thin, flexible tube (catheter) will be placed into your bladder to drain urine. A tube may be placed through your nose and into your stomach to drain stomach fluids (nasogastric tube, or NG tube). Your abdomen will be filled with air so it expands. This gives the surgeon more room to operate and makes your organs easier to see. Several small cuts (incisions) will be made in your abdomen. A thin, lighted tube with a tiny camera on the end (laparoscope) will be put through one of the small incisions. The camera on the laparoscope will send a picture to a computer screen in the operating room. This will give the surgeon a good view inside your abdomen. Hollow tubes will be put through the other small incisions in your abdomen. The tools that are needed for the procedure will be put through these tubes. Clamps or staples will be put on both ends of the diseased part of the colon. The part of the intestine between  the clamps or staples will be removed. If possible, the ends of the healthy colon that remain will be stitched (sutured) or stapled together to allow your body to pass waste (stool). Sometimes, the remaining colon cannot be stitched back together. If this is the case, a colostomy will be needed. If you need a colostomy: An opening to the outside of your body (stoma) will be made through your abdomen. The end of your colon will be brought to the opening. It will be stitched to the skin. A bag will be attached to the opening. Stool will drain into this removable bag. The colostomy may be temporary or permanent. The incisions from the colectomy will be closed with sutures or staples. The procedure may vary among health care providers and hospitals. What happens after the procedure? Your blood pressure, heart rate, breathing rate, and blood oxygen level will be monitored until the medicines you were given have worn off. You will receive fluids  through an IV tube until your bowels start to work properly. Once your bowels are working again, you will be given clear liquids first and then solid food as tolerated. You will be given medicines to control your pain and nausea, if needed. Do not drive for 24 hours if you were given a sedative. This information is not intended to replace advice given to you by your health care provider. Make sure you discuss any questions you have with your health care provider. Document Released: 11/09/2002 Document Revised: 05/20/2016 Document Reviewed: 05/20/2016 Elsevier Interactive Patient Education  2018 [employer].     Laparoscopic Colectomy, Care After This sheet gives you information about how to care for yourself after your procedure. Your health care provider may also give you more specific instructions. If you have problems or questions, contact your health care provider. What can I expect after the procedure? After your procedure, it is common to have the following: Pain in your abdomen, especially in the incision areas. You will be given medicine to control the pain. Tiredness. This is a normal part of the recovery process. Your energy level will return to normal over the next several weeks. Changes in your bowel movements, such as constipation or needing to go more often. Talk with your health care provider about how to manage this.  Follow these instructions at home: Medicines Take over-the-counter and prescription medicines only as told by your health care provider. Do not drive or use heavy machinery while taking prescription pain medicine. Do not drink alcohol  while taking prescription pain medicine. If you were prescribed an antibiotic medicine, use it as told by your health care provider. Do not stop using the antibiotic even if you start to feel better. Incision care Follow instructions from your health care provider about how to take care of your incision areas. Make sure  you: Keep your incisions clean and dry. Wash your hands with soap and water before and after applying medicine to the areas, and before and after changing your bandage (dressing). If soap and water are not available, use hand sanitizer. Change your dressing as told by your health care provider. Leave stitches (sutures), skin glue, or adhesive strips in place. These skin closures may need to stay in place for 2 weeks or longer. If adhesive strip edges start to loosen and curl up, you may trim the loose edges. Do not remove adhesive strips completely unless your health care provider tells you to do that. Do not wear tight clothing over the incisions. Tight clothing  may rub and irritate the incision areas, which may cause the incisions to open. Do not take baths, swim, or use a hot tub until your health care provider approves. Ask your health care provider if you can take showers. You may only be allowed to take sponge baths for bathing. Check your incision area every day for signs of infection. Check for: More redness, swelling, or pain. More fluid or blood. Warmth. Pus or a bad smell. Activity Avoid lifting anything that is heavier than 10 lb (4.5 kg) for 2 weeks or until your health care provider says it is okay. You may resume normal activities as told by your health care provider. Ask your health care provider what activities are safe for you. Take rest breaks during the day as needed. Eating and drinking Follow instructions from your health care provider about what you can eat after surgery. To prevent or treat constipation while you are taking prescription pain medicine, your health care provider may recommend that you: Drink enough fluid to keep your urine clear or pale yellow. Take over-the-counter or prescription medicines. Eat foods that are high in fiber, such as fresh fruits and vegetables, whole grains, and beans. Limit foods that are high in fat and processed sugars, such as fried  and sweet foods. General instructions Ask your health care provider when you will need an appointment to get your sutures or staples removed. Keep all follow-up visits as told by your health care provider. This is important. Contact a health care provider if: You have more redness, swelling, or pain around your incisions. You have more fluid or blood coming from the incisions. Your incisions feel warm to the touch. You have pus or a bad smell coming from your incisions or your dressing. You have a fever. You have an incision that breaks open (edges not staying together) after sutures or staples have been removed. Get help right away if: You develop a rash. You have chest pain or difficulty breathing. You have pain or swelling in your legs. You feel light-headed or you faint. Your abdomen swells (becomes distended). You have nausea or vomiting. You have blood in your stool (feces). This information is not intended to replace advice given to you by your health care provider. Make sure you discuss any questions you have with your health care provider. Document Released: 03/08/2005 Document Revised: 05/20/2016 Document Reviewed: 05/20/2016 Elsevier Interactive Patient Education  [employer].

## 2024-01-21 NOTE — Progress Notes (Signed)
 Outpatient Surgical Follow Up  01/21/2024  Michael Rowe is an 83 y.o. male.   Chief Complaint  Patient presents with   New Patient (Initial Visit)    Colon tumor    HPI:  Michael Rowe is a 83 y.o. male seen in d/u sigmoid masss. He  came in to the hospital  with failure to thrive and symptomatic anemia.    He denies any hematemesis.   He was hospitalized and was given 2 units of blood with great response He Drinks 3-5 mixed drinks a day (vodka and ginger ale) He was found to have an INR of 3.2. Recent INR 1.4,  has stopped ETOH since last hospitalization  he has been struggling w walking better and has more strength Note that have personally reviewed the colonoscopy images.  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       -  SEPARATE FRAGMENT OF COLONIC MUCOSA WITH A SINGLE ATYPICAL CRYPT CONSISTENT       FOR HIGH-GRADE DYSPLASIA.    CT of the chest abdomen pelvis a couple years ago that I personally reviewed no evidence of significant thoracic or abdominal acute illness He Did have a history of upper GI bleed and had prepyloric ulcer in 2023. Recent endoscopy yesterday failed to reveal any active pathology     Past Medical History:  Diagnosis Date   AKI (acute kidney injury) (HCC) 09/30/2017   Alcohol  abuse    Atrial fibrillation (HCC)    a. patient reports being diagnosed with A fib ~ 15-20 years prior in [place], [place]; b. noted to be in Afib 1/19; c. CHADS2VASc => 4 (HTN, age x 2, vascular dosease)   CHF (congestive heart failure) (HCC)    HLD (hyperlipidemia)    Hypertension    Macrocytic anemia    Morbid obesity (HCC)     Past Surgical History:  Procedure Laterality Date   CARDIOVERSION N/A 11/14/2017   Procedure: CARDIOVERSION;  Surgeon: Devorah Fonder, MD;   Location: [facility] ORS;  Service: Cardiovascular;  Laterality: N/A;   COLONOSCOPY N/A 01/15/2024   Procedure: COLONOSCOPY;  Surgeon: [name], MD;  Location: [facility] ENDOSCOPY;  Service: Endoscopy;  Laterality: N/A;   ESOPHAGOGASTRODUODENOSCOPY N/A 01/14/2024   Procedure: EGD (ESOPHAGOGASTRODUODENOSCOPY);  Surgeon: [name], MD;  Location: [facility] ENDOSCOPY;  Service: Endoscopy;  Laterality: N/A;   ESOPHAGOGASTRODUODENOSCOPY (EGD) WITH PROPOFOL  N/A 02/28/2022   Procedure: ESOPHAGOGASTRODUODENOSCOPY (EGD) WITH PROPOFOL ;  Surgeon: Shane Darling, MD;  Location: [facility] ENDOSCOPY;  Service: Endoscopy;  Laterality: N/A;   KNEE SURGERY      Family History  Problem Relation Age of Onset   Liver disease Mother    Heart disease Father    CAD Brother    Cancer Brother    Cancer Brother    Cancer Brother     Social History:  reports that he has never smoked. He has never used smokeless tobacco. He reports current alcohol  use of about 4.0 - 5.0 standard drinks of alcohol  per week. He reports that he does not use drugs.  Allergies: No Known Allergies  Medications reviewed.    ROS Full ROS performed and is otherwise negative other than what is stated in HPI   BP 111/71   Pulse (!) 112   Temp 97.8 F (36.6 C) (Oral)  Ht 5' 10.5" (1.791 m)   Wt 212 lb 3.2 oz (96.3 kg)   SpO2 99%   BMI 30.02 kg/m   Physical Exam CONSTITUTIONAL: chronically ill . EYES: Pupils are equal, round, and reactive to light, Sclera are non-icteric. EARS, NOSE, MOUTH AND THROAT: The oropharynx is clear. The oral mucosa is pink and moist. Hearing is intact to voice. LYMPH NODES:  Lymph nodes in the neck are normal. RESPIRATORY:  Lungs are clear. There is normal respiratory effort, with equal breath sounds bilaterally, and without pathologic use of accessory muscles. CARDIOVASCULAR: Heart is iregular without murmurs, gallops, or rubs. GI: The abdomen is  soft, nontender, and nondistended. There are no palpable  masses. There is no hepatosplenomegaly. There are normal bowel sounds in all quadrants. GU: Rectal deferred.   MUSCULOSKELETAL: Normal muscle strength and tone. No cyanosis or edema.   SKIN: Turgor is good and there are no pathologic skin lesions or ulcers. NEUROLOGIC: Motor and sensation is grossly normal. Cranial nerves are grossly intact. PSYCH:  Oriented to person, place and time. Affect is normal. 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  Currently he is not having active bleeding or obstruction.  Discussed with the patient and the family in detail about endoscopic findings.  I had also an extensive discussion about further next steps to sigmoid colectomy. .  We talked about the procedure and the risk the benefits and the possible complications.  I do think that he is functional and he wishes to have further therapy for the sigmoid mass. I do think is reasonable to attempt resection robotically. Procedure d/w pt and family. R, B and possible complications including but not limited to bleeding, infection, anastomotic leak, ostomy creation, blood transfusion, CV and respiratory issues,They agree with procedure.   Please note that I spent 45 minutes in this encounter including extensive review of medical records, coordinating his care, personally reviewing imaging studies, placing orders and performing documentation No need for urgent intervention at this time  Evelia Hipp, MD [facility] General Surgeon

## 2024-01-21 NOTE — H&P (View-Only) (Signed)
 Outpatient Surgical Follow Up  01/21/2024  Michael Rowe is an 83 y.o. male.   Chief Complaint  Patient presents with   New Patient (Initial Visit)    Colon tumor    HPI:  Michael Rowe is a 83 y.o. male seen in d/u sigmoid masss. He  came in to the hospital  with failure to thrive and symptomatic anemia.    He denies any hematemesis.   He was hospitalized and was given 2 units of blood with great response He Drinks 3-5 mixed drinks a day (vodka and ginger ale) He was found to have an INR of 3.2. Recent INR 1.4,  has stopped ETOH since last hospitalization  he has been struggling w walking better and has more strength Note that have personally reviewed the colonoscopy images.  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       -  SEPARATE FRAGMENT OF COLONIC MUCOSA WITH A SINGLE ATYPICAL CRYPT CONSISTENT       FOR HIGH-GRADE DYSPLASIA.    CT of the chest abdomen pelvis a couple years ago that I personally reviewed no evidence of significant thoracic or abdominal acute illness He Did have a history of upper GI bleed and had prepyloric ulcer in 2023. Recent endoscopy yesterday failed to reveal any active pathology     Past Medical History:  Diagnosis Date   AKI (acute kidney injury) (HCC) 09/30/2017   Alcohol  abuse    Atrial fibrillation (HCC)    a. patient reports being diagnosed with A fib ~ 15-20 years prior in [place], [place]; b. noted to be in Afib 1/19; c. CHADS2VASc => 4 (HTN, age x 2, vascular dosease)   CHF (congestive heart failure) (HCC)    HLD (hyperlipidemia)    Hypertension    Macrocytic anemia    Morbid obesity (HCC)     Past Surgical History:  Procedure Laterality Date   CARDIOVERSION N/A 11/14/2017   Procedure: CARDIOVERSION;  Surgeon: Devorah Fonder, MD;   Location: [facility] ORS;  Service: Cardiovascular;  Laterality: N/A;   COLONOSCOPY N/A 01/15/2024   Procedure: COLONOSCOPY;  Surgeon: [name], MD;  Location: [facility] ENDOSCOPY;  Service: Endoscopy;  Laterality: N/A;   ESOPHAGOGASTRODUODENOSCOPY N/A 01/14/2024   Procedure: EGD (ESOPHAGOGASTRODUODENOSCOPY);  Surgeon: [name], MD;  Location: [facility] ENDOSCOPY;  Service: Endoscopy;  Laterality: N/A;   ESOPHAGOGASTRODUODENOSCOPY (EGD) WITH PROPOFOL  N/A 02/28/2022   Procedure: ESOPHAGOGASTRODUODENOSCOPY (EGD) WITH PROPOFOL ;  Surgeon: Shane Darling, MD;  Location: [facility] ENDOSCOPY;  Service: Endoscopy;  Laterality: N/A;   KNEE SURGERY      Family History  Problem Relation Age of Onset   Liver disease Mother    Heart disease Father    CAD Brother    Cancer Brother    Cancer Brother    Cancer Brother     Social History:  reports that he has never smoked. He has never used smokeless tobacco. He reports current alcohol  use of about 4.0 - 5.0 standard drinks of alcohol  per week. He reports that he does not use drugs.  Allergies: No Known Allergies  Medications reviewed.    ROS Full ROS performed and is otherwise negative other than what is stated in HPI   BP 111/71   Pulse (!) 112   Temp 97.8 F (36.6 C) (Oral)  Ht 5' 10.5" (1.791 m)   Wt 212 lb 3.2 oz (96.3 kg)   SpO2 99%   BMI 30.02 kg/m   Physical Exam CONSTITUTIONAL: chronically ill . EYES: Pupils are equal, round, and reactive to light, Sclera are non-icteric. EARS, NOSE, MOUTH AND THROAT: The oropharynx is clear. The oral mucosa is pink and moist. Hearing is intact to voice. LYMPH NODES:  Lymph nodes in the neck are normal. RESPIRATORY:  Lungs are clear. There is normal respiratory effort, with equal breath sounds bilaterally, and without pathologic use of accessory muscles. CARDIOVASCULAR: Heart is iregular without murmurs, gallops, or rubs. GI: The abdomen is  soft, nontender, and nondistended. There are no palpable  masses. There is no hepatosplenomegaly. There are normal bowel sounds in all quadrants. GU: Rectal deferred.   MUSCULOSKELETAL: Normal muscle strength and tone. No cyanosis or edema.   SKIN: Turgor is good and there are no pathologic skin lesions or ulcers. NEUROLOGIC: Motor and sensation is grossly normal. Cranial nerves are grossly intact. PSYCH:  Oriented to person, place and time. Affect is normal. 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  Currently he is not having active bleeding or obstruction.  Discussed with the patient and the family in detail about endoscopic findings.  I had also an extensive discussion about further next steps to sigmoid colectomy. .  We talked about the procedure and the risk the benefits and the possible complications.  I do think that he is functional and he wishes to have further therapy for the sigmoid mass. I do think is reasonable to attempt resection robotically. Procedure d/w pt and family. R, B and possible complications including but not limited to bleeding, infection, anastomotic leak, ostomy creation, blood transfusion, CV and respiratory issues,They agree with procedure.   Please note that I spent 45 minutes in this encounter including extensive review of medical records, coordinating his care, personally reviewing imaging studies, placing orders and performing documentation No need for urgent intervention at this time  Evelia Hipp, MD [facility] General Surgeon

## 2024-01-23 ENCOUNTER — Other Ambulatory Visit

## 2024-01-23 ENCOUNTER — Encounter
Admission: RE | Admit: 2024-01-23 | Discharge: 2024-01-23 | Disposition: A | Discharge status: Home / Self Care | Source: Ambulatory Visit | Attending: Surgery | Admitting: Surgery

## 2024-01-23 ENCOUNTER — Other Ambulatory Visit: Payer: Self-pay

## 2024-01-23 ENCOUNTER — Encounter: Payer: Self-pay | Admitting: Surgery

## 2024-01-23 VITALS — BP 111/69 | HR 78 | Temp 97.8°F | Resp 18 | Ht 70.5 in | Wt 209.0 lb

## 2024-01-23 DIAGNOSIS — K769 Liver disease, unspecified: Secondary | ICD-10-CM | POA: Insufficient documentation

## 2024-01-23 DIAGNOSIS — C187 Malignant neoplasm of sigmoid colon: Secondary | ICD-10-CM | POA: Diagnosis not present

## 2024-01-23 DIAGNOSIS — D689 Coagulation defect, unspecified: Secondary | ICD-10-CM | POA: Diagnosis not present

## 2024-01-23 DIAGNOSIS — I10 Essential (primary) hypertension: Secondary | ICD-10-CM

## 2024-01-23 DIAGNOSIS — Z01812 Encounter for preprocedural laboratory examination: Secondary | ICD-10-CM | POA: Insufficient documentation

## 2024-01-23 HISTORY — DX: Depression, unspecified: F32.A

## 2024-01-23 LAB — HEMOGLOBIN A1C
Hgb A1c MFr Bld: 4.8 % (ref 4.8–5.6)
Mean Plasma Glucose: 91.06 mg/dL

## 2024-01-23 LAB — CBC WITH DIFFERENTIAL/PLATELET
Abs Immature Granulocytes: 0.02 10*3/uL (ref 0.00–0.07)
Basophils Absolute: 0.1 10*3/uL (ref 0.0–0.1)
Basophils Relative: 1 %
Eosinophils Absolute: 0.3 10*3/uL (ref 0.0–0.5)
Eosinophils Relative: 5 %
HCT: 30.8 % — ABNORMAL LOW (ref 39.0–52.0)
Hemoglobin: 8.9 g/dL — ABNORMAL LOW (ref 13.0–17.0)
Immature Granulocytes: 0 %
Lymphocytes Relative: 15 %
Lymphs Abs: 0.8 10*3/uL (ref 0.7–4.0)
MCH: 23.1 pg — ABNORMAL LOW (ref 26.0–34.0)
MCHC: 28.9 g/dL — ABNORMAL LOW (ref 30.0–36.0)
MCV: 79.8 fL — ABNORMAL LOW (ref 80.0–100.0)
Monocytes Absolute: 0.5 10*3/uL (ref 0.1–1.0)
Monocytes Relative: 10 %
Neutro Abs: 3.6 10*3/uL (ref 1.7–7.7)
Neutrophils Relative %: 69 %
Platelets: 262 10*3/uL (ref 150–400)
RBC: 3.86 MIL/uL — ABNORMAL LOW (ref 4.22–5.81)
RDW: 23.6 % — ABNORMAL HIGH (ref 11.5–15.5)
Smear Review: NORMAL
WBC: 5.2 10*3/uL (ref 4.0–10.5)
nRBC: 0 % (ref 0.0–0.2)

## 2024-01-23 LAB — TYPE AND SCREEN
ABO/RH(D): A POS
Antibody Screen: NEGATIVE
Extend sample reason: TRANSFUSED

## 2024-01-23 LAB — COMPREHENSIVE METABOLIC PANEL WITH GFR
ALT: 7 U/L (ref 0–44)
AST: 17 U/L (ref 15–41)
Albumin: 3.3 g/dL — ABNORMAL LOW (ref 3.5–5.0)
Alkaline Phosphatase: 41 U/L (ref 38–126)
Anion gap: 9 (ref 5–15)
BUN: 15 mg/dL (ref 8–23)
CO2: 23 mmol/L (ref 22–32)
Calcium: 8.9 mg/dL (ref 8.9–10.3)
Chloride: 105 mmol/L (ref 98–111)
Creatinine, Ser: 1.29 mg/dL — ABNORMAL HIGH (ref 0.61–1.24)
GFR, Estimated: 55 mL/min — ABNORMAL LOW (ref >60)
Glucose, Bld: 98 mg/dL (ref 70–99)
Potassium: 4.6 mmol/L (ref 3.5–5.1)
Sodium: 137 mmol/L (ref 135–145)
Total Bilirubin: 1.3 mg/dL — ABNORMAL HIGH (ref 0.0–1.2)
Total Protein: 5.6 g/dL — ABNORMAL LOW (ref 6.5–8.1)

## 2024-01-23 LAB — PROTIME-INR
INR: 1.1 (ref 0.8–1.2)
Prothrombin Time: 14.5 s (ref 11.4–15.2)

## 2024-01-23 NOTE — Patient Instructions (Addendum)
 Your procedure is scheduled on:    TUESDAY MAY 27  Report to the Registration Desk on the 1st floor of the [facility]. To find out your arrival time, please call [phone] between 1PM - 3PM on:   MONDAY MAY 26  If your arrival time is 6:00 am, do not arrive before that time as the [facility] entrance doors do not open until 6:00 am.  REMEMBER: Instructions that are not followed completely may result in serious medical risk, up to and including death; or upon the discretion of your surgeon and anesthesiologist your surgery may need to be rescheduled.  Do not eat food after midnight the night before surgery.  No gum chewing or hard candies.  One week prior to surgery: Stop Anti-inflammatories (NSAIDS) such as Advil, Aleve, Ibuprofen, Motrin, Naproxen, Naprosyn and Aspirin based products such as Excedrin, Goody's Powder, BC Powder. Stop ANY OVER THE COUNTER supplements until after surgery. cyanocobalamin  (VITAMIN B12)  ferrous sulfate  325 (65 FE)  folic acid  (FOLVITE )  KLOR-CON   You may however, continue to take Tylenol  if needed for pain up until the day of surgery.  **Follow recommendations regarding stopping blood thinners.** XARELTO  Paused since Fri 01/16/2024. Resumes on Mon 02/16/2024.  Continue taking all of your other prescription medications up until the day of surgery.  ON THE DAY OF SURGERY ONLY TAKE THESE MEDICATIONS WITH SIPS OF WATER:  metoprolol  tartrate (LOPRESSOR )  pantoprazole  (PROTONIX )   No Alcohol  for 24 hours before or after surgery.  No Smoking including e-cigarettes for 24 hours before surgery.  No chewable tobacco products for at least 6 hours before surgery.  No nicotine patches on the day of surgery.  Do not use any "recreational" drugs for at least a week (preferably 2 weeks) before your surgery.  Please be advised that the combination of cocaine and anesthesia may have negative outcomes, up to and including death. If you test positive for cocaine,  your surgery will be cancelled.  On the morning of surgery brush your teeth with toothpaste and water, you may rinse your mouth with mouthwash if you wish. Do not swallow any toothpaste or mouthwash.  Use CHG Soap as directed on instruction sheet.  Do not wear jewelry, make-up, hairpins, clips or nail polish.  For welded (permanent) jewelry: bracelets, anklets, waist bands, etc.  Please have this removed prior to surgery.  If it is not removed, there is a chance that hospital personnel will need to cut it off on the day of surgery.  Do not wear lotions, powders, or perfumes.   Do not shave body hair from the neck down 48 hours before surgery.  Contact lenses, hearing aids and dentures may not be worn into surgery.  Do not bring valuables to the hospital. [facility] is not responsible for any missing/lost belongings or valuables.   Notify your doctor if there is any change in your medical condition (cold, fever, infection).  Wear comfortable clothing (specific to your surgery type) to the hospital.  After surgery, you can help prevent lung complications by doing breathing exercises.  Take deep breaths and cough every 1-2 hours.   When coughing or sneezing, hold a pillow firmly against your incision with both hands. This is called "splinting." Doing this helps protect your incision. It also decreases belly discomfort.  If you are being admitted to the hospital overnight, leave your suitcase in the car. After surgery it may be brought to your room.  In case of increased patient census, it  may be necessary for you, the patient, to continue your postoperative care in the Same Day Surgery department.  If you are being discharged the day of surgery, you will not be allowed to drive home. You will need a responsible individual to drive you home and stay with you for 24 hours after surgery.   If you are taking public transportation, you will need to have a responsible individual with  you.  Please call the Pre-admissions Testing Dept. at [phone] if you have any questions about these instructions.  Surgery Visitation Policy:  Patients having surgery or a procedure may have two visitors.  Children under the age of 92 must have an adult with them who is not the patient.  Inpatient Visitation:    Visiting hours are 7 a.m. to 8 p.m. Up to four visitors are allowed at one time in a patient room. The visitors may rotate out with other people during the day.  One visitor age 62 or older may stay with the patient overnight and must be in the room by 8 p.m.       Preparing for Surgery with CHLORHEXIDINE GLUCONATE (CHG) Soap  Chlorhexidine Gluconate (CHG) Soap  o An antiseptic cleaner that kills germs and bonds with the skin to continue killing germs even after washing  o Used for showering the night before surgery and morning of surgery  Before surgery, you can play an important role by reducing the number of germs on your skin.  CHG (Chlorhexidine gluconate) soap is an antiseptic cleanser which kills germs and bonds with the skin to continue killing germs even after washing.  Please do not use if you have an allergy to CHG or antibacterial soaps. If your skin becomes reddened/irritated stop using the CHG.  1. Shower the NIGHT BEFORE SURGERY and the MORNING OF SURGERY with CHG soap.  2. If you choose to wash your hair, wash your hair first as usual with your normal shampoo.  3. After shampooing, rinse your hair and body thoroughly to remove the shampoo.  4. Use CHG as you would any other liquid soap. You can apply CHG directly to the skin and wash gently with a scrungie or a clean washcloth.  5. Apply the CHG soap to your body only from the neck down. Do not use on open wounds or open sores. Avoid contact with your eyes, ears, mouth, and genitals (private parts). Wash face and genitals (private parts) with your normal soap.  6. Wash thoroughly, paying  special attention to the area where your surgery will be performed.  7. Thoroughly rinse your body with warm water.  8. Do not shower/wash with your normal soap after using and rinsing off the CHG soap.  9. Pat yourself dry with a clean towel.  10. Wear clean pajamas to bed the night before surgery.  11. Place clean sheets on your bed the night of your first shower and do not sleep with pets.  12. Shower again with the CHG soap on the day of surgery prior to arriving at the hospital.  13. Do not apply any deodorants/lotions/powders.  14. Please wear clean clothes to the hospital.

## 2024-01-23 NOTE — Consult Note (Addendum)
 WOC Nurse requested for preoperative stoma site marking  Discussed surgical procedure and possible stoma creation with patient. Explained role of the WOC nurse team.  Provided the patient with educational booklet and provided samples of pouching options.  Answered patient's questions.   Examined patient sitting, and standing in order to place the marking in the patient's visual field, away from any creases or abdominal contour issues and within the rectus muscle.  Attempted to mark below the patient's belt line, but this was not possible, since a significant crease occurs lower on the abd when he leans forward which should be avoided if possible.   Marked for colostomy in the LLQ  __6__ cm to the left of the umbilicus and __3__cm above the umbilicus.  Marked for ileostomy in the RLQ  __6__cm to the right of the umbilicus and  __3__ cm above the umbilicus.  Patient's abdomen cleansed with CHG wipes at site markings, allowed to air dry prior to marking.Covered mark with thin film transparent dressing to preserve mark until date of surgery. Provided with marking pen and instructed to re-color in if the marks begin to fade prior to surgery.   WOC Nurse team will follow up with patient after surgery for continued ostomy care and teaching if he receives an ostomy.   Please re-consult if further assistance is needed.  Thank-you,  Wiliam Harder MSN, RN, CWOCN, [place], CNS [phone]

## 2024-01-25 LAB — VITAMIN K1, SERUM: VITAMIN K1: 2.25 ng/mL — ABNORMAL HIGH (ref 0.10–2.20)

## 2024-01-27 ENCOUNTER — Inpatient Hospital Stay: Payer: Self-pay | Admitting: Urgent Care

## 2024-01-27 ENCOUNTER — Encounter
Admission: RE | Disposition: A | Discharge status: Home / Self Care | Payer: Self-pay | Source: Home / Self Care | Attending: Surgery

## 2024-01-27 ENCOUNTER — Inpatient Hospital Stay
Admission: RE | Admit: 2024-01-27 | Discharge: 2024-01-30 | DRG: 330 | Disposition: A | Discharge status: Home / Self Care | Attending: Surgery | Admitting: Surgery

## 2024-01-27 ENCOUNTER — Other Ambulatory Visit: Payer: Self-pay

## 2024-01-27 ENCOUNTER — Encounter: Payer: Self-pay | Admitting: Surgery

## 2024-01-27 DIAGNOSIS — N179 Acute kidney failure, unspecified: Secondary | ICD-10-CM | POA: Diagnosis not present

## 2024-01-27 DIAGNOSIS — C189 Malignant neoplasm of colon, unspecified: Secondary | ICD-10-CM | POA: Diagnosis not present

## 2024-01-27 DIAGNOSIS — I4819 Other persistent atrial fibrillation: Secondary | ICD-10-CM | POA: Diagnosis not present

## 2024-01-27 DIAGNOSIS — I110 Hypertensive heart disease with heart failure: Secondary | ICD-10-CM | POA: Diagnosis not present

## 2024-01-27 DIAGNOSIS — Z8711 Personal history of peptic ulcer disease: Secondary | ICD-10-CM | POA: Diagnosis not present

## 2024-01-27 DIAGNOSIS — E785 Hyperlipidemia, unspecified: Secondary | ICD-10-CM | POA: Diagnosis present

## 2024-01-27 DIAGNOSIS — Z8249 Family history of ischemic heart disease and other diseases of the circulatory system: Secondary | ICD-10-CM

## 2024-01-27 DIAGNOSIS — Z8719 Personal history of other diseases of the digestive system: Secondary | ICD-10-CM | POA: Diagnosis not present

## 2024-01-27 DIAGNOSIS — E860 Dehydration: Secondary | ICD-10-CM | POA: Diagnosis present

## 2024-01-27 DIAGNOSIS — R627 Adult failure to thrive: Secondary | ICD-10-CM | POA: Diagnosis not present

## 2024-01-27 DIAGNOSIS — C187 Malignant neoplasm of sigmoid colon: Principal | ICD-10-CM | POA: Diagnosis present

## 2024-01-27 DIAGNOSIS — D649 Anemia, unspecified: Secondary | ICD-10-CM | POA: Diagnosis present

## 2024-01-27 DIAGNOSIS — I509 Heart failure, unspecified: Secondary | ICD-10-CM | POA: Diagnosis not present

## 2024-01-27 DIAGNOSIS — I4891 Unspecified atrial fibrillation: Secondary | ICD-10-CM | POA: Diagnosis present

## 2024-01-27 HISTORY — PX: COLECTOMY, SIGMOID, ROBOT-ASSISTED: SHX7542

## 2024-01-27 LAB — PREPARE RBC (CROSSMATCH)

## 2024-01-27 SURGERY — COLECTOMY, SIGMOID, ROBOT-ASSISTED
Anesthesia: General | Site: Abdomen

## 2024-01-27 MED ORDER — GABAPENTIN 300 MG PO CAPS
ORAL_CAPSULE | ORAL | Status: AC
  Filled 2024-01-27: qty 1

## 2024-01-27 MED ORDER — ALVIMOPAN 12 MG PO CAPS
12.0000 mg | ORAL_CAPSULE | Freq: Every day | ORAL | Status: DC
  Administered 2024-01-28: 12 mg via ORAL

## 2024-01-27 MED ORDER — BUPIVACAINE LIPOSOME 1.3 % IJ SUSP
INTRAMUSCULAR | Status: DC | PRN
  Administered 2024-01-27: 20 mL

## 2024-01-27 MED ORDER — SUGAMMADEX SODIUM 200 MG/2ML IV SOLN
INTRAVENOUS | Status: DC | PRN
  Administered 2024-01-27: 200 mg via INTRAVENOUS

## 2024-01-27 MED ORDER — PANTOPRAZOLE SODIUM 40 MG IV SOLR
40.0000 mg | Freq: Two times a day (BID) | INTRAVENOUS | Status: DC
  Administered 2024-01-27 – 2024-01-30 (×6): 40 mg via INTRAVENOUS
  Filled 2024-01-27 (×5): qty 10

## 2024-01-27 MED ORDER — BUPIVACAINE-EPINEPHRINE (PF) 0.25% -1:200000 IJ SOLN
INTRAMUSCULAR | Status: AC
  Filled 2024-01-27: qty 30

## 2024-01-27 MED ORDER — DIPHENHYDRAMINE HCL 50 MG/ML IJ SOLN: 12.5000 mg | Freq: Four times a day (QID) | INTRAMUSCULAR | Status: DC | PRN

## 2024-01-27 MED ORDER — SODIUM CHLORIDE 0.9 % IV SOLN
2.0000 g | Freq: Two times a day (BID) | INTRAVENOUS | Status: AC
Start: 2024-01-27 — End: 2024-01-29
  Administered 2024-01-27 – 2024-01-28 (×3): 2 g via INTRAVENOUS

## 2024-01-27 MED ORDER — OXYCODONE HCL 5 MG PO TABS
5.0000 mg | ORAL_TABLET | Freq: Once | ORAL | Status: AC | PRN
  Administered 2024-01-27: 5 mg via ORAL

## 2024-01-27 MED ORDER — LACTATED RINGERS IV SOLN: INTRAVENOUS | Status: DC

## 2024-01-27 MED ORDER — FENTANYL CITRATE (PF) 100 MCG/2ML IJ SOLN
25.0000 ug | INTRAMUSCULAR | Status: DC | PRN
  Administered 2024-01-27: 25 ug via INTRAVENOUS
  Administered 2024-01-27 (×2): 50 ug via INTRAVENOUS

## 2024-01-27 MED ORDER — ACETAMINOPHEN 500 MG PO TABS
1000.0000 mg | ORAL_TABLET | ORAL | Status: AC
  Administered 2024-01-27: 1000 mg via ORAL

## 2024-01-27 MED ORDER — LORAZEPAM 1 MG PO TABS: 1.0000 mg | ORAL_TABLET | ORAL | Status: DC | PRN

## 2024-01-27 MED ORDER — SPY AGENT GREEN - (INDOCYANINE FOR INJECTION)
INTRAMUSCULAR | Status: DC | PRN
Start: 2024-01-27 — End: 2024-01-27
  Administered 2024-01-27: 2 mL via INTRAVENOUS

## 2024-01-27 MED ORDER — FENTANYL CITRATE (PF) 100 MCG/2ML IJ SOLN
INTRAMUSCULAR | Status: AC
Start: 2024-01-27 — End: ?
  Filled 2024-01-27: qty 2

## 2024-01-27 MED ORDER — PROPOFOL 10 MG/ML IV BOLUS
INTRAVENOUS | Status: AC
  Filled 2024-01-27: qty 20

## 2024-01-27 MED ORDER — ONDANSETRON HCL 4 MG/2ML IJ SOLN: 4.0000 mg | Freq: Four times a day (QID) | INTRAMUSCULAR | Status: DC | PRN

## 2024-01-27 MED ORDER — ALBUMIN HUMAN 5 % IV SOLN
INTRAVENOUS | Status: AC
  Filled 2024-01-27: qty 250

## 2024-01-27 MED ORDER — ALVIMOPAN 12 MG PO CAPS
ORAL_CAPSULE | ORAL | Status: AC
  Filled 2024-01-27: qty 1

## 2024-01-27 MED ORDER — HYDRALAZINE HCL 20 MG/ML IJ SOLN: 10.0000 mg | INTRAMUSCULAR | Status: DC | PRN

## 2024-01-27 MED ORDER — MORPHINE SULFATE (PF) 2 MG/ML IV SOLN: 2.0000 mg | INTRAVENOUS | Status: DC | PRN

## 2024-01-27 MED ORDER — ENOXAPARIN SODIUM 40 MG/0.4ML IJ SOSY
40.0000 mg | PREFILLED_SYRINGE | INTRAMUSCULAR | Status: DC
  Administered 2024-01-28 – 2024-01-30 (×3): 40 mg via SUBCUTANEOUS
  Filled 2024-01-27 (×3): qty 0.4

## 2024-01-27 MED ORDER — OXYCODONE HCL 5 MG PO TABS: 5.0000 mg | ORAL_TABLET | ORAL | Status: DC | PRN

## 2024-01-27 MED ORDER — CELECOXIB 200 MG PO CAPS
ORAL_CAPSULE | ORAL | Status: AC
  Filled 2024-01-27: qty 1

## 2024-01-27 MED ORDER — BUPIVACAINE LIPOSOME 1.3 % IJ SUSP
INTRAMUSCULAR | Status: AC
  Filled 2024-01-27: qty 20

## 2024-01-27 MED ORDER — FENTANYL CITRATE (PF) 100 MCG/2ML IJ SOLN
INTRAMUSCULAR | Status: AC
  Filled 2024-01-27: qty 2

## 2024-01-27 MED ORDER — SODIUM CHLORIDE 0.9 % IV SOLN
2.0000 g | INTRAVENOUS | Status: AC
  Administered 2024-01-27: 2 g via INTRAVENOUS

## 2024-01-27 MED ORDER — MIRTAZAPINE 15 MG PO TABS
15.0000 mg | ORAL_TABLET | Freq: Every day | ORAL | Status: DC
  Administered 2024-01-27 – 2024-01-29 (×3): 15 mg via ORAL
  Filled 2024-01-27 (×3): qty 1

## 2024-01-27 MED ORDER — THIAMINE MONONITRATE 100 MG PO TABS
100.0000 mg | ORAL_TABLET | Freq: Every day | ORAL | Status: DC
  Administered 2024-01-27 – 2024-01-30 (×4): 100 mg via ORAL
  Filled 2024-01-27 (×4): qty 1

## 2024-01-27 MED ORDER — ONDANSETRON HCL 4 MG/2ML IJ SOLN
INTRAMUSCULAR | Status: AC
  Filled 2024-01-27: qty 2

## 2024-01-27 MED ORDER — METHOCARBAMOL 500 MG PO TABS
500.0000 mg | ORAL_TABLET | Freq: Three times a day (TID) | ORAL | Status: DC | PRN
Start: 2024-01-27 — End: 2024-01-30

## 2024-01-27 MED ORDER — DEXAMETHASONE SODIUM PHOSPHATE 10 MG/ML IJ SOLN
INTRAMUSCULAR | Status: AC
  Filled 2024-01-27: qty 1

## 2024-01-27 MED ORDER — SODIUM CHLORIDE 0.9 % IV SOLN
INTRAVENOUS | Status: AC
  Filled 2024-01-27: qty 2

## 2024-01-27 MED ORDER — LIDOCAINE HCL (PF) 2 % IJ SOLN
INTRAMUSCULAR | Status: AC
  Filled 2024-01-27: qty 5

## 2024-01-27 MED ORDER — SODIUM CHLORIDE (PF) 0.9 % IJ SOLN
INTRAMUSCULAR | Status: AC
  Filled 2024-01-27: qty 50

## 2024-01-27 MED ORDER — ALVIMOPAN 12 MG PO CAPS
12.0000 mg | ORAL_CAPSULE | ORAL | Status: AC
  Administered 2024-01-27: 12 mg via ORAL

## 2024-01-27 MED ORDER — CELECOXIB 200 MG PO CAPS
200.0000 mg | ORAL_CAPSULE | ORAL | Status: AC
  Administered 2024-01-27: 200 mg via ORAL

## 2024-01-27 MED ORDER — ROCURONIUM BROMIDE 10 MG/ML (PF) SYRINGE
PREFILLED_SYRINGE | INTRAVENOUS | Status: AC
  Filled 2024-01-27: qty 10

## 2024-01-27 MED ORDER — PHENYLEPHRINE HCL-NACL 20-0.9 MG/250ML-% IV SOLN
INTRAVENOUS | Status: DC | PRN
  Administered 2024-01-27: 30 ug/min via INTRAVENOUS

## 2024-01-27 MED ORDER — BUPIVACAINE-EPINEPHRINE 0.25% -1:200000 IJ SOLN
INTRAMUSCULAR | Status: DC | PRN
  Administered 2024-01-27: 30 mL

## 2024-01-27 MED ORDER — PHENYLEPHRINE 80 MCG/ML (10ML) SYRINGE FOR IV PUSH (FOR BLOOD PRESSURE SUPPORT)
PREFILLED_SYRINGE | INTRAVENOUS | Status: DC | PRN
  Administered 2024-01-27: 160 ug via INTRAVENOUS
  Administered 2024-01-27: 240 ug via INTRAVENOUS
  Administered 2024-01-27 (×2): 80 ug via INTRAVENOUS
  Administered 2024-01-27: 16 ug via INTRAVENOUS

## 2024-01-27 MED ORDER — OXYCODONE HCL 5 MG PO TABS
ORAL_TABLET | ORAL | Status: AC
  Filled 2024-01-27: qty 1

## 2024-01-27 MED ORDER — ONDANSETRON 4 MG PO TBDP: 4.0000 mg | ORAL_TABLET | Freq: Four times a day (QID) | ORAL | Status: DC | PRN

## 2024-01-27 MED ORDER — LIDOCAINE HCL (CARDIAC) PF 100 MG/5ML IV SOSY
PREFILLED_SYRINGE | INTRAVENOUS | Status: DC | PRN
  Administered 2024-01-27: 100 mg via INTRAVENOUS

## 2024-01-27 MED ORDER — ROCURONIUM BROMIDE 100 MG/10ML IV SOLN
INTRAVENOUS | Status: DC | PRN
  Administered 2024-01-27: 60 mg via INTRAVENOUS
  Administered 2024-01-27: 20 mg via INTRAVENOUS
  Administered 2024-01-27: 10 mg via INTRAVENOUS

## 2024-01-27 MED ORDER — ORAL CARE MOUTH RINSE: 15.0000 mL | Freq: Once | OROMUCOSAL | Status: AC

## 2024-01-27 MED ORDER — OXYCODONE HCL 5 MG/5ML PO SOLN: 5.0000 mg | Freq: Once | ORAL | Status: AC | PRN

## 2024-01-27 MED ORDER — SODIUM CHLORIDE 0.9 % IV SOLN: INTRAVENOUS | Status: AC

## 2024-01-27 MED ORDER — PROCHLORPERAZINE EDISYLATE 10 MG/2ML IJ SOLN: 5.0000 mg | Freq: Four times a day (QID) | INTRAMUSCULAR | Status: DC | PRN

## 2024-01-27 MED ORDER — CHLORHEXIDINE GLUCONATE 0.12 % MT SOLN
15.0000 mL | Freq: Once | OROMUCOSAL | Status: AC
  Administered 2024-01-27: 15 mL via OROMUCOSAL

## 2024-01-27 MED ORDER — KETOROLAC TROMETHAMINE 15 MG/ML IJ SOLN
15.0000 mg | Freq: Four times a day (QID) | INTRAMUSCULAR | Status: DC
  Administered 2024-01-27 – 2024-01-29 (×7): 15 mg via INTRAVENOUS
  Filled 2024-01-27 (×7): qty 1

## 2024-01-27 MED ORDER — CHLORHEXIDINE GLUCONATE 0.12 % MT SOLN
OROMUCOSAL | Status: AC
  Filled 2024-01-27: qty 15

## 2024-01-27 MED ORDER — LORAZEPAM 2 MG/ML IJ SOLN: 1.0000 mg | INTRAMUSCULAR | Status: DC | PRN

## 2024-01-27 MED ORDER — ALBUMIN HUMAN 5 % IV SOLN: INTRAVENOUS | Status: DC | PRN

## 2024-01-27 MED ORDER — PROCHLORPERAZINE MALEATE 10 MG PO TABS: 10.0000 mg | ORAL_TABLET | Freq: Four times a day (QID) | ORAL | Status: DC | PRN

## 2024-01-27 MED ORDER — CHLORHEXIDINE GLUCONATE CLOTH 2 % EX PADS
6.0000 | MEDICATED_PAD | Freq: Once | CUTANEOUS | Status: AC
  Administered 2024-01-27: 6 via TOPICAL

## 2024-01-27 MED ORDER — PROPOFOL 10 MG/ML IV BOLUS
INTRAVENOUS | Status: DC | PRN
  Administered 2024-01-27: 150 mg via INTRAVENOUS

## 2024-01-27 MED ORDER — DIPHENHYDRAMINE HCL 12.5 MG/5ML PO ELIX: 12.5000 mg | ORAL_SOLUTION | Freq: Four times a day (QID) | ORAL | Status: DC | PRN

## 2024-01-27 MED ORDER — THIAMINE HCL 100 MG/ML IJ SOLN
100.0000 mg | Freq: Every day | INTRAMUSCULAR | Status: DC
  Filled 2024-01-27: qty 2

## 2024-01-27 MED ORDER — ONDANSETRON HCL 4 MG/2ML IJ SOLN
INTRAMUSCULAR | Status: DC | PRN
  Administered 2024-01-27: 4 mg via INTRAVENOUS

## 2024-01-27 MED ORDER — GLYCOPYRROLATE 0.2 MG/ML IJ SOLN
INTRAMUSCULAR | Status: DC | PRN
  Administered 2024-01-27: .2 mg via INTRAVENOUS

## 2024-01-27 MED ORDER — DEXAMETHASONE SODIUM PHOSPHATE 10 MG/ML IJ SOLN
INTRAMUSCULAR | Status: DC | PRN
  Administered 2024-01-27: 10 mg via INTRAVENOUS

## 2024-01-27 MED ORDER — FOLIC ACID 1 MG PO TABS
1.0000 mg | ORAL_TABLET | Freq: Every day | ORAL | Status: DC
  Administered 2024-01-27 – 2024-01-30 (×4): 1 mg via ORAL
  Filled 2024-01-27 (×4): qty 1

## 2024-01-27 MED ORDER — FENTANYL CITRATE (PF) 100 MCG/2ML IJ SOLN
INTRAMUSCULAR | Status: DC | PRN
  Administered 2024-01-27 (×2): 50 ug via INTRAVENOUS

## 2024-01-27 MED ORDER — METOPROLOL TARTRATE 25 MG PO TABS
25.0000 mg | ORAL_TABLET | Freq: Two times a day (BID) | ORAL | Status: DC
  Administered 2024-01-27: 25 mg via ORAL
  Filled 2024-01-27 (×3): qty 1

## 2024-01-27 MED ORDER — CHLORHEXIDINE GLUCONATE CLOTH 2 % EX PADS: 6.0000 | MEDICATED_PAD | Freq: Once | CUTANEOUS | Status: DC

## 2024-01-27 MED ORDER — ACETAMINOPHEN 500 MG PO TABS
ORAL_TABLET | ORAL | Status: AC
  Filled 2024-01-27: qty 2

## 2024-01-27 MED ORDER — METHOCARBAMOL 1000 MG/10ML IJ SOLN: 500.0000 mg | Freq: Three times a day (TID) | INTRAMUSCULAR | Status: DC | PRN

## 2024-01-27 MED ORDER — GABAPENTIN 300 MG PO CAPS
300.0000 mg | ORAL_CAPSULE | ORAL | Status: AC
  Administered 2024-01-27: 300 mg via ORAL

## 2024-01-27 MED ORDER — ADULT MULTIVITAMIN W/MINERALS CH
1.0000 | ORAL_TABLET | Freq: Every day | ORAL | Status: DC
  Administered 2024-01-27 – 2024-01-30 (×4): 1 via ORAL
  Filled 2024-01-27 (×4): qty 1

## 2024-01-27 MED ORDER — ACETAMINOPHEN 500 MG PO TABS
1000.0000 mg | ORAL_TABLET | Freq: Four times a day (QID) | ORAL | Status: DC
  Administered 2024-01-27 – 2024-01-30 (×11): 1000 mg via ORAL
  Filled 2024-01-27 (×11): qty 2

## 2024-01-27 SURGICAL SUPPLY — 70 items
BAG LAPAROSCOPIC 12 15 PORT 16 (BASKET) IMPLANT
CANNULA REDUCER 12-8 DVNC XI (CANNULA) ×1 IMPLANT
CATH ROBINSON RED A/P 16FR (CATHETERS) ×1 IMPLANT
CLIP APPLIE XI LRG REUSE DVNC (INSTRUMENTS) IMPLANT
CLIP LIGATING HEM O LOK PURPLE (MISCELLANEOUS) IMPLANT
COVER LIGHT HANDLE STERIS (MISCELLANEOUS) IMPLANT
COVER MAYO STAND STRL (DRAPES) ×1 IMPLANT
COVER TIP SHEARS 8 DVNC (MISCELLANEOUS) ×1 IMPLANT
DERMABOND ADVANCED .7 DNX12 (GAUZE/BANDAGES/DRESSINGS) ×1 IMPLANT
DRAPE ARM DVNC X/XI (DISPOSABLE) ×4 IMPLANT
DRAPE COLUMN DVNC XI (DISPOSABLE) ×1 IMPLANT
DRAPE LEGGINS SURG 28X43 STRL (DRAPES) ×1 IMPLANT
DRAPE UNDER BUTTOCK W/FLU (DRAPES) ×1 IMPLANT
ELECT BLADE 6.5 EXT (BLADE) ×1 IMPLANT
ELECTRODE REM PT RTRN 9FT ADLT (ELECTROSURGICAL) ×1 IMPLANT
FORCEPS BPLR R/ABLATION 8 DVNC (INSTRUMENTS) ×1 IMPLANT
GLOVE BIO SURGEON STRL SZ7 (GLOVE) ×3 IMPLANT
GOWN STRL REUS W/ TWL LRG LVL3 (GOWN DISPOSABLE) ×5 IMPLANT
GRASPER LAPSCPC 5X45 DSP (INSTRUMENTS) ×1 IMPLANT
GRASPER TIP-UP FEN DVNC XI (INSTRUMENTS) ×1 IMPLANT
HANDLE YANKAUER SUCT BULB TIP (MISCELLANEOUS) ×1 IMPLANT
IRRIGATION STRYKERFLOW (MISCELLANEOUS) ×1 IMPLANT
IV NS 1000ML BAXH (IV SOLUTION) ×1 IMPLANT
KIT IMAGING PINPOINTPAQ (MISCELLANEOUS) ×1 IMPLANT
KIT PINK PAD W/HEAD ARE REST (MISCELLANEOUS) ×1 IMPLANT
KIT PINK PAD W/HEAD ARM REST (MISCELLANEOUS) ×1 IMPLANT
LABEL OR SOLS (LABEL) ×1 IMPLANT
MANIFOLD NEPTUNE II (INSTRUMENTS) ×1 IMPLANT
NDL DRIVE SUT CUT DVNC (INSTRUMENTS) ×1 IMPLANT
NDL HYPO 22X1.5 SAFETY MO (MISCELLANEOUS) ×1 IMPLANT
NEEDLE DRIVE SUT CUT DVNC (INSTRUMENTS) ×1 IMPLANT
NEEDLE HYPO 22X1.5 SAFETY MO (MISCELLANEOUS) ×1 IMPLANT
NS IRRIG 500ML POUR BTL (IV SOLUTION) ×1 IMPLANT
OBTURATOR OPTICALSTD 8 DVNC (TROCAR) ×1 IMPLANT
PACK COLON CLEAN CLOSURE (MISCELLANEOUS) ×1 IMPLANT
PACK LAP CHOLECYSTECTOMY (MISCELLANEOUS) ×1 IMPLANT
PAD PREP OB/GYN DISP 24X41 (PERSONAL CARE ITEMS) ×1 IMPLANT
PORT ACCESS TROCAR AIRSEAL 5 (TROCAR) ×1 IMPLANT
RELOAD STAPLE 60 3.5 BLU DVNC (STAPLE) IMPLANT
RELOAD STAPLER 3.5X60 BLU DVNC (STAPLE) ×3 IMPLANT
SCISSORS MNPLR CVD DVNC XI (INSTRUMENTS) ×1 IMPLANT
SEAL UNIV 5-12 XI (MISCELLANEOUS) ×3 IMPLANT
SEALER VESSEL EXT DVNC XI (MISCELLANEOUS) ×1 IMPLANT
SET TRI-LUMEN FLTR TB AIRSEAL (TUBING) ×1 IMPLANT
SOLUTION ELECTROSURG ANTI STCK (MISCELLANEOUS) ×1 IMPLANT
SOLUTION PREP PVP 2OZ (MISCELLANEOUS) ×1 IMPLANT
SPIKE FLUID TRANSFER (MISCELLANEOUS) ×1 IMPLANT
SPONGE T-LAP 18X18 ~~LOC~~+RFID (SPONGE) ×3 IMPLANT
SPONGE T-LAP 4X18 ~~LOC~~+RFID (SPONGE) IMPLANT
STAPLER 60 SUREFORM DVNC (STAPLE) IMPLANT
STAPLER CIRCULAR MANUAL XL 25 (STAPLE) IMPLANT
STAPLER CIRCULAR MANUAL XL 29 (STAPLE) IMPLANT
STAPLER CIRCULAR MANUAL XL 33 (STAPLE) IMPLANT
SURGILUBE 2OZ TUBE FLIPTOP (MISCELLANEOUS) ×1 IMPLANT
SUT MNCRL AB 4-0 PS2 18 (SUTURE) ×1 IMPLANT
SUT PDS 2-0 27IN (SUTURE) ×1 IMPLANT
SUT PDS AB 0 CT1 27 (SUTURE) ×2 IMPLANT
SUT PROLENE 2 0 SH DA (SUTURE) ×1 IMPLANT
SUT SILK 2 0 SH (SUTURE) ×1 IMPLANT
SUT SILK 2-0 30XBRD TIE 12 (SUTURE) ×1 IMPLANT
SUT STRATA 2-0 23CM CT-2 (SUTURE) IMPLANT
SUT VIC AB 2-0 SH 27XBRD (SUTURE) ×1 IMPLANT
SUT VICRYL 0 UR6 27IN ABS (SUTURE) ×1 IMPLANT
SYR 20ML LL LF (SYRINGE) ×2 IMPLANT
SYRINGE TOOMEY IRRIG 70ML (MISCELLANEOUS) ×1 IMPLANT
SYSTEM TROCR 1.5-3 SLV ABD GEL (ENDOMECHANICALS) ×1 IMPLANT
TOWEL OR 17X26 4PK STRL BLUE (TOWEL DISPOSABLE) IMPLANT
TRAP FLUID SMOKE EVACUATOR (MISCELLANEOUS) ×1 IMPLANT
TRAY FOLEY SLVR 16FR LF STAT (SET/KITS/TRAYS/PACK) ×1 IMPLANT
WATER STERILE IRR 500ML POUR (IV SOLUTION) ×1 IMPLANT

## 2024-01-27 NOTE — Transfer of Care (Signed)
 Immediate Anesthesia Transfer of Care Note  Patient: Michael Rowe  Procedure(s) Performed: COLECTOMY, SIGMOID, ROBOT-ASSISTED (Abdomen)  Patient Location: PACU  Anesthesia Type:General  Level of Consciousness: awake, alert , and patient cooperative  Airway & Oxygen Therapy: Patient Spontanous Breathing and Patient connected to face mask oxygen  Post-op Assessment: Report given to RN, Post -op Vital signs reviewed and stable, and Patient moving all extremities  Post vital signs: Reviewed and stable  Last Vitals:  Vitals Value Taken Time  BP 101/56 01/27/24 1435  Temp 36.2 C 01/27/24 1435  Pulse 70 01/27/24 1438  Resp 31 01/27/24 1439  SpO2 100 % 01/27/24 1438  Vitals shown include unfiled device data.  Last Pain:  Vitals:   01/27/24 1040  TempSrc: Temporal  PainSc: 0-No pain         Complications: No notable events documented.

## 2024-01-27 NOTE — Interval H&P Note (Signed)
 History and Physical Interval Note:  01/27/2024 10:44 AM  Baby Bolt  has presented today for surgery, with the diagnosis of colon cancer.  The various methods of treatment have been discussed with the patient and family. After consideration of risks, benefits and other options for treatment, the patient has consented to  Procedure(s): COLECTOMY, SIGMOID, ROBOT-ASSISTED (N/A) as a surgical intervention.  The patient's history has been reviewed, patient examined, no change in status, stable for surgery.  I have reviewed the patient's chart and labs.  Questions were answered to the patient's satisfaction.     Hanah Moultry F Fredna Stricker

## 2024-01-27 NOTE — Anesthesia Procedure Notes (Cosign Needed)
 Procedure Name: Intubation Date/Time: 01/27/2024 11:30 AM  Performed by: Enrique Harvest, MDPre-anesthesia Checklist: Patient identified, Emergency Drugs available, Suction available and Patient being monitored Patient Re-evaluated:Patient Re-evaluated prior to induction Oxygen Delivery Method: Circle system utilized Preoxygenation: Pre-oxygenation with 100% oxygen Induction Type: IV induction Ventilation: Mask ventilation without difficulty Laryngoscope Size: McGrath and 4 Grade View: Grade I Tube type: Oral Tube size: 7.5 mm Number of attempts: 1 Airway Equipment and Method: Stylet and Oral airway Placement Confirmation: ETT inserted through vocal cords under direct vision, positive ETCO2 and breath sounds checked- equal and bilateral Secured at: 23 cm Tube secured with: Tape Dental Injury: Teeth and Oropharynx as per pre-operative assessment

## 2024-01-27 NOTE — Op Note (Addendum)
 PROCEDURES: 1. Robotic assisted Laparoscopic Low anterior resection 2. Robotic Laparoscopic takedown of splenic flexure 3. ICG perfusion check of pedicle graft for anastomosis   Pre-operative Diagnosis: Sigmoid CA  Post-operative Diagnosis: Same  Surgeon: Marcial Setting Emmogene Simson   Assistants: Gabriela Johns [facility]. ( required for the EEA anastomosis and for exposure)  Anesthesia: General endotracheal anesthesia  ASA Class: 2   Surgeon: Evelia Hipp , MD FACS  Anesthesia: Gen. with endotracheal tube   Findings: No evidence of metastatic disease Tattoo within sigmoid colon Great perfusion of Descending colon by ICG Tension free anastomosis with very good perfusion and negative intraoperative leak   Estimated Blood Loss: 20cc                Specimens: colon       Complications: none         Condition: stable  Procedure Details  The patient was seen again in the Holding Room. The benefits, complications, treatment options, and expected outcomes were discussed with the patient. The risks of bleeding, infection, recurrence of symptoms, failure to resolve symptoms, anastomotic leak, bowel injury, any of which could require further surgery were reviewed with the patient.   The patient was taken to Operating Room, identified  and the procedure verified.  A Time Out was held and the above information confirmed.  Prior to the induction of general anesthesia, antibiotic prophylaxis was administered. VTE prophylaxis was in place. General endotracheal anesthesia was then administered and tolerated well. After the induction, the abdomen was prepped with Chloraprep and draped in the sterile fashion. The patient was positioned in lithotomy position. 4 cm incision was created Right lower quadrant. The abdominal cavity was entered under direct visualization and the Mini GelPort device was placed and pneumoperitoneum was obtained, no hemodynamic changes were apparent. Three 8 mm robotic ports were placed under  direct visualization and an additional 5 mm assist port was placed. Patient was positioned in steep trendelenburg and left side up. Robot was brought to the field and docked in the standard fashion. WE maintained visualization of our instruments at all times and avoided any collision between arms. I scrubbed out and went to the console. There were  some adhesions from sigmoid to the abdominal wall that where lysed in the standard fashion with the scissors. The tattoo was clearly identified in the mid  sigmoid   We identified the takeoff of the inferior mesenteric artery dissected the pedicle and clipped divided it using XL clipps  in the standard fashion.   Using the sealer we were able to divide the mesorectum and and also divided the mesentery of the descending colon we mobilized the descending colon IN a medial to lateral fashion. We preserved the ureter at all times. Pelvic dissection was performed in a standard fashion, being in the areolar space anterior to the sacrum. THe mesorectum was divided with the vessel sealer. We dissected the rectum circumferentially and We divided at the mid rectum with standard 60 mm blue load  The white line of Toldtt was identified and divided and  We were also able to mobilize the splenic flexure using scisors in the standard fashion. The mesentery of the descending colon was divided and we divided the mid descending colon with a blue load 60mm stapler. We used ICG green IV to make sure the distal descneding colon had good perfusion. We made sure we had adequate reach so the anastomosis could be performed tension free. The specimen was removed in a bag. The robot  was undocked and  I scrubbed back in. Under direct visualization the descending colon stump was opened and measured the diameter of the bowel A 33 mm dilator was perfect size. A pursestring was used after inserting the anvil device. Mr Nowell Begun [facility] was able to pass a 33 mm standard EEA stapler device  through the anus and Under direct visualization we performed an end to end anastomosis with the EEA device. A leak test was performed inflating the colon with a Toomey syringe and a rubber catheter. No evidence of leak was observed. There was also adequate hemostasis.   All the laparoscopic ports were removed and a second look showed no evidence of any bleeding or any other injuries.  Liposomal marcaine was infiltrated at all incision sites in a full thickness fashion.  We changed gloves and  close the  abdomen with two -0 PDS sutures in a running fashion to close the anterior and posterior fascia respectively. The skin incisions were closed with 4-0 Monocryl. Dermabond was used to coat all the skin incisions. Needle and laparotomy count were correct and there were no immediate complications. Please not e that MR Nowell Begun [facility] was essential during the case and he helped with the creation of anastomosis, exposure and closure.  Evelia Hipp, MD, FACS

## 2024-01-28 ENCOUNTER — Encounter: Payer: Self-pay | Admitting: Surgery

## 2024-01-28 LAB — BASIC METABOLIC PANEL WITH GFR
Anion gap: 8 (ref 5–15)
BUN: 13 mg/dL (ref 8–23)
CO2: 20 mmol/L — ABNORMAL LOW (ref 22–32)
Calcium: 8 mg/dL — ABNORMAL LOW (ref 8.9–10.3)
Chloride: 107 mmol/L (ref 98–111)
Creatinine, Ser: 1.55 mg/dL — ABNORMAL HIGH (ref 0.61–1.24)
GFR, Estimated: 44 mL/min — ABNORMAL LOW (ref >60)
Glucose, Bld: 151 mg/dL — ABNORMAL HIGH (ref 70–99)
Potassium: 4.4 mmol/L (ref 3.5–5.1)
Sodium: 135 mmol/L (ref 135–145)

## 2024-01-28 LAB — CBC
HCT: 26.5 % — ABNORMAL LOW (ref 39.0–52.0)
Hemoglobin: 7.7 g/dL — ABNORMAL LOW (ref 13.0–17.0)
MCH: 23.3 pg — ABNORMAL LOW (ref 26.0–34.0)
MCHC: 29.1 g/dL — ABNORMAL LOW (ref 30.0–36.0)
MCV: 80.3 fL (ref 80.0–100.0)
Platelets: 220 10*3/uL (ref 150–400)
RBC: 3.3 MIL/uL — ABNORMAL LOW (ref 4.22–5.81)
RDW: 23.8 % — ABNORMAL HIGH (ref 11.5–15.5)
WBC: 9.9 10*3/uL (ref 4.0–10.5)
nRBC: 0 % (ref 0.0–0.2)

## 2024-01-28 MED ORDER — SODIUM CHLORIDE 0.9 % IV SOLN
INTRAVENOUS | Status: AC
  Filled 2024-01-28: qty 2

## 2024-01-28 MED ORDER — ALVIMOPAN 12 MG PO CAPS
ORAL_CAPSULE | ORAL | Status: AC
  Filled 2024-01-28: qty 1

## 2024-01-28 MED ORDER — ALBUMIN HUMAN 25 % IV SOLN
25.0000 g | Freq: Once | INTRAVENOUS | Status: AC
  Administered 2024-01-28: 25 g via INTRAVENOUS

## 2024-01-28 MED ORDER — METOCLOPRAMIDE HCL 10 MG PO TABS
ORAL_TABLET | ORAL | Status: AC
  Filled 2024-01-28: qty 1

## 2024-01-28 MED ORDER — IRON SUCROSE 200 MG IVPB - SIMPLE MED
200.0000 mg | Freq: Once | Status: AC
  Administered 2024-01-28: 200 mg via INTRAVENOUS
  Filled 2024-01-28: qty 200

## 2024-01-28 MED ORDER — FERROUS SULFATE 325 (65 FE) MG PO TABS
ORAL_TABLET | ORAL | Status: AC
  Filled 2024-01-28: qty 1

## 2024-01-28 MED ORDER — ALBUMIN NICU 25% IV SOLUTION
INTRAVENOUS | Status: AC
Start: 2024-01-28 — End: ?
  Filled 2024-01-28: qty 100

## 2024-01-28 NOTE — Progress Notes (Signed)
 [facility] SURGICAL PROGRESS NOTE  Hospital Day(s): 1.   Post op day(s): 1 Day Post-Op.   Interval History:  Patient seen and examined No acute events or new complaints overnight.  Patient reports he feels great He reports no abdominal pain No fever, chills, nausea, emesis  He is without leukocytosis; WBC 9.9K Hgb to 7.7 Renal function baseline; sCr - 1.55; UO - 175 ccs + unmeasured No electrolyte derangements CLD; tolerating without issue He reports he is "passing gas like crazy."   Vital signs in last 24 hours: [min-max] current  Temp:  [97 F (36.1 C)-98.4 F (36.9 C)] 97.8 F (36.6 C) (05/28 0714) Pulse Rate:  [63-104] 71 (05/28 0714) Resp:  [11-21] 15 (05/28 0714) BP: (100-122)/(52-77) 101/59 (05/28 0714) SpO2:  [92 %-100 %] 97 % (05/28 0714) Weight:  [94.8 kg] 94.8 kg (05/27 1040)     Height: 5' 10.5" (179.1 cm) Weight: 94.8 kg BMI (Calculated): 29.55   Intake/Output last 2 shifts:  05/27 0701 - 05/28 0700 In: 2264.3 [I.V.:1564.3; IV Piggyback:700] Out: 200 [Urine:175; Blood:25]   Physical Exam:  Constitutional: alert, cooperative and no distress  Respiratory: breathing non-labored at rest  Cardiovascular: regular rate and sinus rhythm  Gastrointestinal: soft, non-tender, and non-distended, no rebound/guarding  Genitourinary: Foley in place; good UO Integumentary: Laparoscopic incisions are CDI with dermabond, no erythema or drainage   Labs:     Latest Ref Rng & Units 01/28/2024    5:53 AM 01/23/2024   11:48 AM 01/16/2024    6:13 AM  CBC  WBC 4.0 - 10.5 K/uL 9.9  5.2  7.1   Hemoglobin 13.0 - 17.0 g/dL 7.7  8.9  7.8   Hematocrit 39.0 - 52.0 % 26.5  30.8  26.5   Platelets 150 - 400 K/uL 220  262  204       Latest Ref Rng & Units 01/28/2024    5:53 AM 01/23/2024   11:48 AM 01/16/2024    6:13 AM  CMP  Glucose 70 - 99 mg/dL 782  98  956   BUN 8 - 23 mg/dL 13  15  14    Creatinine 0.61 - 1.24 mg/dL 2.13  0.86  5.78   Sodium 135 - 145 mmol/L  135  137  137   Potassium 3.5 - 5.1 mmol/L 4.4  4.6  4.2   Chloride 98 - 111 mmol/L 107  105  108   CO2 22 - 32 mmol/L 20  23  23    Calcium  8.9 - 10.3 mg/dL 8.0  8.9  7.7   Total Protein 6.5 - 8.1 g/dL  5.6  5.2   Total Bilirubin 0.0 - 1.2 mg/dL  1.3  1.6   Alkaline Phos 38 - 126 U/L  41  38   AST 15 - 41 U/L  17  17   ALT 0 - 44 U/L  7  6      Imaging studies: No new pertinent imaging studies   Assessment/Plan:  83 y.o. male 1 Day Post-Op s/p robotic assisted laparoscopic LAR for sigmoid colon CA   - We can do FLD this AM; advance as tolerated  - Complete perioperative Abx - Discontinue Entereg given return of bowel function - Discontinue foley catheter   - Monitor abdominal examination; on-going bowel function   - Pain control prn; antiemetics prn - Mobilize; low threshold to engage therapies - CIWA protocol prn - Morning labs; monitor H&H - DVT prophylaxis     - Discharge Planning; Doing  well with ROBF. Likely benefit from another 24-48 hours prior to DC.   All of the above findings and recommendations were discussed with the patient, and the medical team, and all of patient's questions were answered to his expressed satisfaction.  -- Apolonio Bay, PA-C [facility] 01/28/2024, 8:18 AM M-F: 7am - 4pm

## 2024-01-28 NOTE — Evaluation (Signed)
 Physical Therapy Evaluation Patient Details Name: Michael Rowe MRN: [id number] DOB: 1941-06-25 Today's Date: 01/28/2024  History of Present Illness  83 y/o male with history of colon cancer, s/p Laparoscopic takedown of splenic flexure/anterior resection 5/27.  Clinical Impression  Pt very pleasant and engaged t/o PT eval and treat.  He reports no abdominal pain and generally showed good confidence and safety with mobility tasks.  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  Pt overall showed good confidence and feels close to his baseline with ~100 ft of ambulation with walker (reports only uses SPC at baseline) and managed to negotiate up/down steps with relatively ease.  Pt will not likely need f/u at home but will maintain on caseload to maximize mobility and safety in prep for return home post op.        If plan is discharge home, recommend the following: Assist for transportation;Help with stairs or ramp for entrance   Can travel by private vehicle        Equipment Recommendations None recommended by PT  Recommendations for Other Services       Functional Status Assessment Patient has had a recent decline in their functional status and demonstrates the ability to make significant improvements in function in a reasonable and predictable amount of time.     Precautions / Restrictions Precautions Precautions: Fall Recall of Precautions/Restrictions: Intact Required Braces or Orthoses:  (pt has b/l AFO, requests not to wear them) Restrictions Weight Bearing Restrictions Per Provider Order: No      Mobility  Bed Mobility Overal bed mobility: Modified Independent                  Transfers Overall transfer level:  Modified independent Equipment used: Rolling walker (2 wheels) Transfers: Sit to/from Stand Sit to Stand: Supervision           General transfer comment: need for UEs and heavy forward lean but no phyiscal assist needed    Ambulation/Gait Ambulation/Gait assistance: Contact guard assist, Min assist Gait Distance (Feet): 100 Feet Assistive device: Rolling walker (2 wheels) Gait Pattern/deviations: Step-through pattern, Decreased dorsiflexion - right, Decreased dorsiflexion - left       General Gait Details: B foot drop, CGA for safety - did catch toe X 2 w/o LOB. Mild fatigue with this distance. Seemd to have increased sob with mobility this session, O2 sats on room air >95%  Stairs Stairs: Yes Stairs assistance: Contact guard assist Stair Management: Two rails, Alternating pattern Number of Stairs: 4 General stair comments: despite foot drop able to negotiate steps head-on and with alteranting pattern - heavy UE use on rails  Wheelchair Mobility     Tilt Bed    Modified Rankin (Stroke Patients Only)       Balance Overall balance assessment: Needs assistance Sitting-balance support: Feet supported Sitting balance-Leahy Scale: Normal     Standing balance support: Bilateral upper extremity supported, During functional activity, Reliant on assistive device for balance Standing balance-Leahy Scale: Good                               Pertinent Vitals/Pain Pain Assessment Pain Assessment: (P) No/denies pain (denies even abdominal  pain)    Home Living Family/patient expects to be discharged to:: Private residence Living Arrangements: Spouse/significant other Available Help at Discharge: Family;Available 24 hours/day Type of Home: House Home Access: Stairs to enter Entrance Stairs-Rails: Right;Left;Can reach both Entrance Stairs-Number of Steps: 6   Home Layout: One level Home Equipment: Agricultural consultant (2 wheels);Cane - single point      Prior  Function Prior Level of Function : Independent/Modified Independent;Driving             Mobility Comments: uses cane when out of the house, no AD when inside. Has a walker if needed - reports walking on treadmill most days ADLs Comments: Independent     Extremity/Trunk Assessment   Upper Extremity Assessment Upper Extremity Assessment: Overall WFL for tasks assessed    Lower Extremity Assessment Lower Extremity Assessment: Overall WFL for tasks assessed RLE Deficits / Details: chronic b/l foot drop       Communication   Communication Communication: No apparent difficulties    Cognition Arousal: Alert Behavior During Therapy: WFL for tasks assessed/performed   PT - Cognitive impairments: No apparent impairments                         Following commands: Intact       Cueing Cueing Techniques: Verbal cues     General Comments General comments (skin integrity, edema, etc.): Pt was very pleasant and motivated t/o the session.  He showed ability to do a prolonged bout of ambulation w/o issue.    Exercises     Assessment/Plan    PT Assessment Patient needs continued PT services  PT Problem List Decreased strength;Decreased activity tolerance;Decreased mobility       PT Treatment Interventions Gait training;DME instruction;Stair training;Functional mobility training;Therapeutic activities;Therapeutic exercise;Patient/family education    PT Goals (Current goals can be found in the Care Plan section)  Acute Rehab PT Goals Patient Stated Goal: return home PT Goal Formulation: With patient Time For Goal Achievement: 02/10/24 Potential to Achieve Goals: Good    Frequency Min 1X/week     Co-evaluation               AM-PAC PT "6 Clicks" Mobility  Outcome Measure Help needed turning from your back to your side while in a flat bed without using bedrails?: None Help needed moving from lying on your back to sitting on the side of a flat bed without  using bedrails?: None Help needed moving to and from a bed to a chair (including a wheelchair)?: A Little Help needed standing up from a chair using your arms (e.g., wheelchair or bedside chair)?: A Little Help needed to walk in hospital room?: A Little Help needed climbing 3-5 steps with a railing? : A Little 6 Click Score: 20    End of Session Equipment Utilized During Treatment: Gait belt Activity Tolerance: Patient tolerated treatment well Patient left: in chair;with call bell/phone within reach;with nursing/sitter in room Nurse Communication: Mobility status PT Visit Diagnosis: Other abnormalities of gait and mobility (R26.89);Muscle weakness (generalized) (M62.81);Difficulty in walking, not elsewhere classified (R26.2);Unsteadiness on feet (R26.81)    Time: 7829-5621 PT Time Calculation (min) (ACUTE ONLY): 23 min   Charges:   PT Evaluation $PT Eval Low Complexity: 1 Low PT Treatments $Gait Training: 8-22 mins PT General Charges $$ ACUTE PT VISIT: 1 Visit         Darice Edelman, DPT 01/28/2024, 10:51 AM

## 2024-01-28 NOTE — Progress Notes (Signed)
 Pt's foley removed this morning. Pt unable to void, states "he has no urgency or bladder pressure". Bladder scan showed 25 mL. Nurse notified PA Zach and received orders to continue fluids overnight.

## 2024-01-28 NOTE — Plan of Care (Signed)

## 2024-01-29 LAB — BASIC METABOLIC PANEL WITH GFR
Anion gap: 8 (ref 5–15)
BUN: 14 mg/dL (ref 8–23)
CO2: 21 mmol/L — ABNORMAL LOW (ref 22–32)
Calcium: 7.7 mg/dL — ABNORMAL LOW (ref 8.9–10.3)
Chloride: 101 mmol/L (ref 98–111)
Creatinine, Ser: 1.99 mg/dL — ABNORMAL HIGH (ref 0.61–1.24)
GFR, Estimated: 33 mL/min — ABNORMAL LOW (ref >60)
Glucose, Bld: 95 mg/dL (ref 70–99)
Potassium: 3.8 mmol/L (ref 3.5–5.1)
Sodium: 130 mmol/L — ABNORMAL LOW (ref 135–145)

## 2024-01-29 LAB — CBC
HCT: 25 % — ABNORMAL LOW (ref 39.0–52.0)
Hemoglobin: 7.4 g/dL — ABNORMAL LOW (ref 13.0–17.0)
MCH: 23.7 pg — ABNORMAL LOW (ref 26.0–34.0)
MCHC: 29.6 g/dL — ABNORMAL LOW (ref 30.0–36.0)
MCV: 80.1 fL (ref 80.0–100.0)
Platelets: 224 10*3/uL (ref 150–400)
RBC: 3.12 MIL/uL — ABNORMAL LOW (ref 4.22–5.81)
RDW: 23.6 % — ABNORMAL HIGH (ref 11.5–15.5)
WBC: 7.3 10*3/uL (ref 4.0–10.5)
nRBC: 0 % (ref 0.0–0.2)

## 2024-01-29 MED ORDER — ALBUMIN NICU 25% IV SOLUTION
INTRAVENOUS | Status: AC
  Filled 2024-01-29: qty 100

## 2024-01-29 MED ORDER — ALBUMIN HUMAN 25 % IV SOLN
25.0000 g | Freq: Once | INTRAVENOUS | Status: AC
  Administered 2024-01-29: 25 g via INTRAVENOUS

## 2024-01-29 MED ORDER — FUROSEMIDE 10 MG/ML IJ SOLN
20.0000 mg | Freq: Once | INTRAMUSCULAR | Status: AC
  Administered 2024-01-29: 20 mg via INTRAVENOUS
  Filled 2024-01-29: qty 4

## 2024-01-29 MED ORDER — METOPROLOL TARTRATE 25 MG PO TABS
12.5000 mg | ORAL_TABLET | Freq: Two times a day (BID) | ORAL | Status: DC
  Administered 2024-01-29 – 2024-01-30 (×2): 12.5 mg via ORAL
  Filled 2024-01-29 (×2): qty 1

## 2024-01-29 NOTE — Progress Notes (Signed)
 [facility] SURGICAL PROGRESS NOTE  Hospital Day(s): 2.   Post op day(s): 2 Days Post-Op.   Interval History:  Patient seen and examined No acute events or new complaints overnight.  Patient reports he feels good; "feels brand new" He denied any abdominal pain, nausea, emesis  He is without leukocytosis; WBC 7.3K Hgb to 7.4 Renal function baseline; sCr - 1.99; UO - 150 ccs recorded since removal; bladder scan this AM with <100 ccs No electrolyte derangements Soft diet; tolerating without issue He reports he continues to pass flatus   Vital signs in last 24 hours: [min-max] current  Temp:  [98 F (36.7 C)-98.4 F (36.9 C)] 98.4 F (36.9 C) (05/28 2207) Pulse Rate:  [74-75] 75 (05/28 2207) Resp:  [16-17] 16 (05/28 2207) BP: (106-119)/(62-70) 106/70 (05/28 2207) SpO2:  [96 %-100 %] 96 % (05/28 2207)     Height: 5' 10.5" (179.1 cm) Weight: 94.8 kg BMI (Calculated): 29.55   Intake/Output last 2 shifts:  05/28 0701 - 05/29 0700 In: 312.6 [P.O.:120; I.V.:92.6; IV Piggyback:100] Out: 595 [Urine:595]   Physical Exam:  Constitutional: alert, cooperative and no distress  Respiratory: breathing non-labored at rest  Cardiovascular: regular rate and sinus rhythm  Gastrointestinal: soft, non-tender, and non-distended, no rebound/guarding  Integumentary: Laparoscopic incisions are CDI with dermabond, no erythema or drainage   Labs:     Latest Ref Rng & Units 01/29/2024    5:26 AM 01/28/2024    5:53 AM 01/23/2024   11:48 AM  CBC  WBC 4.0 - 10.5 K/uL 7.3  9.9  5.2   Hemoglobin 13.0 - 17.0 g/dL 7.4  7.7  8.9   Hematocrit 39.0 - 52.0 % 25.0  26.5  30.8   Platelets 150 - 400 K/uL 224  220  262       Latest Ref Rng & Units 01/29/2024    5:26 AM 01/28/2024    5:53 AM 01/23/2024   11:48 AM  CMP  Glucose 70 - 99 mg/dL 95  161  98   BUN 8 - 23 mg/dL 14  13  15    Creatinine 0.61 - 1.24 mg/dL 0.96  0.45  4.09   Sodium 135 - 145 mmol/L 130  135  137   Potassium 3.5 - 5.1  mmol/L 3.8  4.4  4.6   Chloride 98 - 111 mmol/L 101  107  105   CO2 22 - 32 mmol/L 21  20  23    Calcium  8.9 - 10.3 mg/dL 7.7  8.0  8.9   Total Protein 6.5 - 8.1 g/dL   5.6   Total Bilirubin 0.0 - 1.2 mg/dL   1.3   Alkaline Phos 38 - 126 U/L   41   AST 15 - 41 U/L   17   ALT 0 - 44 U/L   7      Imaging studies: No new pertinent imaging studies   Assessment/Plan:  83 y.o. male with AKI, likely secondary to dehydration from bowel prep + surgery, otherwise doing well 2 Days Post-Op s/p robotic assisted laparoscopic LAR for sigmoid colon CA   - Will increase IVF 75 ml/hr; added albumin as well given AKI  - Okay to continue soft diet as tolerated  - Monitor renal function/UO; bladder scan as needed; repeat BMP in AM  - Monitor abdominal examination; on-going bowel function   - Pain control prn; stop Toradol, avoid nephrotoxic agents  - Antiemetics prn - Mobilize; did well with therapies - CIWA protocol prn -  DVT prophylaxis     - Discharge Planning; AKI otherwise doing well. DC once resolved; hopefully 24-48 hours   All of the above findings and recommendations were discussed with the patient, and the medical team, and all of patient's questions were answered to his expressed satisfaction.  -- Apolonio Bay, PA-C [facility] 01/29/2024, 7:21 AM M-F: 7am - 4pm

## 2024-01-29 NOTE — Plan of Care (Signed)

## 2024-01-29 NOTE — Plan of Care (Signed)
See charting

## 2024-01-29 NOTE — Progress Notes (Signed)
 Per PA Zach, okay to hold metoprolol . Pt BP is 117/72 and HR is 72.

## 2024-01-29 NOTE — Progress Notes (Signed)
 Physical Therapy Treatment Patient Details Name: Michael Rowe MRN: [id number] DOB: 03-09-1941 Today's Date: 01/29/2024   History of Present Illness 83 y/o male with history of colon cancer, s/p Laparoscopic takedown of splenic flexure/anterior resection 5/27.    PT Comments  Pt did well with PT session again today, great effort and motivation.  He showed good tolerance with supine bed exercises and apart from chronic ankle weakness showed good strength and execution.  He was motivated to walk and did go ~100 ft farther than yesterday but clearly fatigued with the effort - SpO2 dropped into the mid 80s and he did have some DOE.  Overall pt doing well post op, activity tolerance remains far from his normally very active baseline.  Pt will benefit from further PT, continue with POC.     If plan is discharge home, recommend the following: Assist for transportation;Help with stairs or ramp for entrance;A little help with walking and/or transfers;Assistance with cooking/housework;Assistance with feeding   Can travel by private vehicle        Equipment Recommendations       Recommendations for Other Services       Precautions / Restrictions Precautions Precautions: Fall Restrictions Weight Bearing Restrictions Per Provider Order: No     Mobility  Bed Mobility Overal bed mobility: Modified Independent             General bed mobility comments: Pt was abe to get himself up to sitting with relative ease, did need UEs/rails    Transfers Overall transfer level: Modified independent Equipment used: Rolling walker (2 wheels) Transfers: Sit to/from Stand Sit to Stand: Supervision           General transfer comment: need for UEs and heavy forward lean but no phyiscal assist needed from standard height bed    Ambulation/Gait Ambulation/Gait assistance: Contact guard assist, Min assist Gait Distance (Feet): 300 Feet Assistive device: Rolling walker (2 wheels)          General Gait Details: B foot drop, CGA for safety He did have increased fatigue with this distance. Mild/mod SOB with the effort SpO2 on room air down to 86%.  Did recover to low 90s after ~3 minutes seated   Stairs         General stair comments: did them w/o issue yesterday, did not wish to do so again today   Wheelchair Mobility     Tilt Bed    Modified Rankin (Stroke Patients Only)       Balance Overall balance assessment: Needs assistance Sitting-balance support: Feet supported Sitting balance-Leahy Scale: Normal     Standing balance support: Bilateral upper extremity supported, During functional activity, Reliant on assistive device for balance Standing balance-Leahy Scale: Good Standing balance comment: certainly needed UE support during ambulation but no LOBs                            Communication Communication Communication: No apparent difficulties  Cognition Arousal: Alert Behavior During Therapy: WFL for tasks assessed/performed   PT - Cognitive impairments: No apparent impairments                         Following commands: Intact      Cueing Cueing Techniques: Verbal cues  Exercises General Exercises - Lower Extremity Quad Sets: Strengthening, 10 reps Short Arc Quad: Strengthening, 10 reps Heel Slides: Strengthening, 10 reps (with resisted leg ext) Hip ABduction/ADduction: Strengthening,  10 reps Straight Leg Raises: AROM, 10 reps    General Comments General comments (skin integrity, edema, etc.): Pt was able to increase ambulation distance today, but clearly nearing his current limit with significant fatigue walking ~300 ft      Pertinent Vitals/Pain Pain Assessment Pain Assessment: No/denies pain    Home Living                          Prior Function            PT Goals (current goals can now be found in the care plan section) Progress towards PT goals: Progressing toward goals    Frequency     Min 2X/week      PT Plan      Co-evaluation              AM-PAC PT "6 Clicks" Mobility   Outcome Measure  Help needed turning from your back to your side while in a flat bed without using bedrails?: None Help needed moving from lying on your back to sitting on the side of a flat bed without using bedrails?: None Help needed moving to and from a bed to a chair (including a wheelchair)?: A Little Help needed standing up from a chair using your arms (e.g., wheelchair or bedside chair)?: A Little Help needed to walk in hospital room?: A Little Help needed climbing 3-5 steps with a railing? : A Little 6 Click Score: 20    End of Session Equipment Utilized During Treatment: Gait belt Activity Tolerance: Patient tolerated treatment well Patient left: in chair;with call bell/phone within reach;with nursing/sitter in room Nurse Communication: Mobility status PT Visit Diagnosis: Other abnormalities of gait and mobility (R26.89);Muscle weakness (generalized) (M62.81);Difficulty in walking, not elsewhere classified (R26.2);Unsteadiness on feet (R26.81)     Time: 5409-8119 PT Time Calculation (min) (ACUTE ONLY): 30 min  Charges:    $Gait Training: 8-22 mins $Therapeutic Exercise: 8-22 mins PT General Charges $$ ACUTE PT VISIT: 1 Visit                     Darice Edelman, DPT 01/29/2024, 11:14 AM

## 2024-01-29 NOTE — Anesthesia Preprocedure Evaluation (Signed)
 Anesthesia Evaluation  Patient identified by MRN, date of birth, ID band Patient awake    Reviewed: Allergy & Precautions, NPO status , Patient's Chart, lab work & pertinent test results  History of Anesthesia Complications Negative for: history of anesthetic complications  Airway Mallampati: III  TM Distance: <3 FB Neck ROM: full    Dental  (+) Missing   Pulmonary neg pulmonary ROS, neg shortness of breath   Pulmonary exam normal        Cardiovascular Exercise Tolerance: Good hypertension, (-) angina +CHF  + dysrhythmias Atrial Fibrillation      Neuro/Psych  PSYCHIATRIC DISORDERS  Depression    negative neurological ROS     GI/Hepatic negative GI ROS, Neg liver ROS,neg GERD  ,,  Endo/Other  negative endocrine ROS    Renal/GU      Musculoskeletal   Abdominal   Peds  Hematology  (+) Blood dyscrasia, anemia   Anesthesia Other Findings Past Medical History: 09/30/2017: AKI (acute kidney injury) (HCC) No date: Alcohol  abuse No date: Atrial fibrillation (HCC)     Comment:  a. patient reports being diagnosed with A fib ~ 15-20               years prior in [place], [place]; b. noted to be in Afib 1/19;               c. CHADS2VASc => 4 (HTN, age x 2, vascular dosease) No date: CHF (congestive heart failure) (HCC) No date: HLD (hyperlipidemia) No date: Hypertension No date: Macrocytic anemia No date: Morbid obesity [facility])  Past Surgical History: 11/14/2017: CARDIOVERSION; N/A     Comment:  Procedure: CARDIOVERSION;  Surgeon: Devorah Fonder,               MD;  Location: [facility] ORS;  Service: Cardiovascular;                Laterality: N/A; 01/14/2024: ESOPHAGOGASTRODUODENOSCOPY; N/A     Comment:  Procedure: EGD (ESOPHAGOGASTRODUODENOSCOPY);  Surgeon:               [name], MD;  Location[facility] ENDOSCOPY;  Service:               Endoscopy;  Laterality: N/A; 02/28/2022: ESOPHAGOGASTRODUODENOSCOPY (EGD) WITH PROPOFOL ;  N/A     Comment:  Procedure: ESOPHAGOGASTRODUODENOSCOPY (EGD) WITH               PROPOFOL ;  Surgeon: Shane Darling, MD;  Location:               [facility] ENDOSCOPY;  Service: Endoscopy;  Laterality: N/A; No date: KNEE SURGERY  BMI    Body Mass Index: 29.29 kg/m      Reproductive/Obstetrics negative OB ROS                              Anesthesia Physical Anesthesia Plan  ASA: 3  Anesthesia Plan: General ETT   Post-op Pain Management:    Induction: Intravenous  PONV Risk Score and Plan: 3 and Ondansetron  and Dexamethasone  Airway Management Planned: Oral ETT  Additional Equipment:   Intra-op Plan:   Post-operative Plan: Extubation in OR  Informed Consent: I have reviewed the patients History and Physical, chart, labs and discussed the procedure including the risks, benefits and alternatives for the proposed anesthesia with the patient or authorized representative who has indicated his/her understanding and acceptance.     Dental Advisory Given  Plan Discussed  with: Anesthesiologist, CRNA and Surgeon  Anesthesia Plan Comments: (Patient consented for risks of anesthesia including but not limited to:  - adverse reactions to medications - damage to eyes, teeth, lips or other oral mucosa - nerve damage due to positioning  - sore throat or hoarseness - Damage to heart, brain, nerves, lungs, other parts of body or loss of life  Patient voiced understanding and assent.)         Anesthesia Quick Evaluation

## 2024-01-29 NOTE — Anesthesia Postprocedure Evaluation (Signed)
 Anesthesia Post Note  Patient: Michael Rowe  Procedure(s) Performed: COLECTOMY, SIGMOID, ROBOT-ASSISTED (Abdomen)  Patient location during evaluation: PACU Anesthesia Type: General Level of consciousness: awake and alert Pain management: pain level controlled Vital Signs Assessment: post-procedure vital signs reviewed and stable Respiratory status: spontaneous breathing, nonlabored ventilation, respiratory function stable and patient connected to nasal cannula oxygen Cardiovascular status: blood pressure returned to baseline and stable Postop Assessment: no apparent nausea or vomiting Anesthetic complications: no   No notable events documented.   Last Vitals:  Vitals:   01/28/24 1720 01/28/24 2207  BP: 119/62 106/70  Pulse: 74 75  Resp: 17 16  Temp: 36.7 C 36.9 C  SpO2: 100% 96%    Last Pain:  Vitals:   01/29/24 0521  TempSrc:   PainSc: 0-No pain                 Enrique Harvest

## 2024-01-30 LAB — CBC
HCT: 26.3 % — ABNORMAL LOW (ref 39.0–52.0)
Hemoglobin: 8.2 g/dL — ABNORMAL LOW (ref 13.0–17.0)
MCH: 24.6 pg — ABNORMAL LOW (ref 26.0–34.0)
MCHC: 31.2 g/dL (ref 30.0–36.0)
MCV: 79 fL — ABNORMAL LOW (ref 80.0–100.0)
Platelets: 210 10*3/uL (ref 150–400)
RBC: 3.33 MIL/uL — ABNORMAL LOW (ref 4.22–5.81)
RDW: 23.8 % — ABNORMAL HIGH (ref 11.5–15.5)
WBC: 6.9 10*3/uL (ref 4.0–10.5)
nRBC: 0 % (ref 0.0–0.2)

## 2024-01-30 LAB — SURGICAL PATHOLOGY

## 2024-01-30 LAB — BASIC METABOLIC PANEL WITH GFR
Anion gap: 6 (ref 5–15)
BUN: 14 mg/dL (ref 8–23)
CO2: 23 mmol/L (ref 22–32)
Calcium: 8.1 mg/dL — ABNORMAL LOW (ref 8.9–10.3)
Chloride: 107 mmol/L (ref 98–111)
Creatinine, Ser: 1.8 mg/dL — ABNORMAL HIGH (ref 0.61–1.24)
GFR, Estimated: 37 mL/min — ABNORMAL LOW (ref >60)
Glucose, Bld: 88 mg/dL (ref 70–99)
Potassium: 3.9 mmol/L (ref 3.5–5.1)
Sodium: 136 mmol/L (ref 135–145)

## 2024-01-30 MED ORDER — PANTOPRAZOLE SODIUM 40 MG IV SOLR
INTRAVENOUS | Status: AC
  Filled 2024-01-30: qty 10

## 2024-01-30 MED ORDER — OXYCODONE HCL 5 MG PO TABS: 5.0000 mg | ORAL_TABLET | Freq: Four times a day (QID) | ORAL | 0 refills | Status: DC | PRN

## 2024-01-30 MED ORDER — PANTOPRAZOLE SODIUM 40 MG PO TBEC
DELAYED_RELEASE_TABLET | ORAL | Status: AC
Start: 2024-01-30 — End: ?
  Filled 2024-01-30: qty 1

## 2024-01-30 NOTE — TOC Initial Note (Signed)
 Transition of Care [facility]) - Initial/Assessment Note    Patient Details  Name: Michael Rowe MRN: [id number] Date of Birth: Oct 07, 1940  [facility]) CM/SW Contact:    Alexandra Ice, RN Phone Number: 01/30/2024, 9:06 AM  Clinical Narrative:                 Patient lives with spouse, independent with ADLs. Has PCP in community. No TOC needs identified.         Patient Goals and CMS Choice            Expected Discharge Plan and Services         Expected Discharge Date: 01/30/24                                    Prior Living Arrangements/Services                       Activities of Daily Living   ADL Screening (condition at time of admission) Independently performs ADLs?: Yes (appropriate for developmental age) Is the patient deaf or have difficulty hearing?: No Does the patient have difficulty seeing, even when wearing glasses/contacts?: No Does the patient have difficulty concentrating, remembering, or making decisions?: No  Permission Sought/Granted                  Emotional Assessment              Admission diagnosis:  Malignant neoplasm of colon, unspecified part of colon (HCC) [C18.9] Cancer of sigmoid [facility]) [C18.7] Patient Active Problem List   Diagnosis Date Noted   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/27/2024   Cancer of sigmoid (HCC) 01/27/2024   Colonic mass 01/16/2024   Blood in stool 01/15/2024   Neoplasm of digestive system 01/15/2024   Longstanding persistent atrial fibrillation (HCC) 01/15/2024   GI bleed 01/14/2024   Symptomatic anemia 01/13/2024   Obesity (BMI 30-39.9) 02/28/2022   Hypomagnesemia 02/28/2022   ABLA (acute blood loss anemia) 02/27/2022   Depression, major, single episode, in partial remission (HCC) 11/06/2018   Chronic diastolic heart failure (HCC) 10/24/2017   Snoring 10/24/2017   Morbid obesity (HCC) 10/01/2017   Alcohol  abuse 10/01/2017   HTN (hypertension) 09/30/2017    HLD (hyperlipidemia) 09/30/2017   Atrial fibrillation with RVR (HCC) 09/30/2017   AKI (acute kidney injury) (HCC) 09/30/2017   PCP:  Deliah Fells, NP Pharmacy:   [facility] [address] [place] - [address] [place] [address] Phone: [phone] Fax: [phone]     Social Drivers of Health (SDOH) Social History: SDOH Screenings   Food Insecurity: No Food Insecurity (01/27/2024)  Housing: Low Risk  (01/27/2024)  Transportation Needs: No Transportation Needs (01/27/2024)  Utilities: Not At Risk (01/27/2024)  Depression (PHQ2-9): Low Risk  (11/06/2018)  Financial Resource Strain: Low Risk  (02/13/2023)   Received from [facility]  Physical Activity: Inactive (10/24/2017)  Social Connections: Moderately Isolated (01/27/2024)  Stress: No Stress Concern Present (10/24/2017)  Tobacco Use: Low Risk  (01/27/2024)   SDOH Interventions:     Readmission Risk Interventions    03/02/2022   10:32 AM  Readmission Risk Prevention Plan  Post Dischage Appt Complete  Medication Screening Complete  Transportation Screening Complete

## 2024-01-30 NOTE — Plan of Care (Signed)
  Problem: Activity: Goal: Risk for activity intolerance will decrease Outcome: Progressing   Problem: Elimination: Goal: Will not experience complications related to urinary retention Outcome: Progressing   

## 2024-01-30 NOTE — Discharge Instructions (Signed)
 In addition to included general post-operative instructions,  Diet: Resume home diet.   Activity: No heavy lifting >20 pounds (children, pets, laundry, garbage) or strenuous activity for 6 weeks, but light activity and walking are encouraged. Do not drive or drink alcohol  if taking narcotic pain medications or having pain that might distract from driving.  Wound care: You may shower/get incision wet with soapy water and pat dry (do not rub incisions), but no baths or submerging incision underwater until follow-up.   Medications: Resume all home medications except Xarelto , please hold this until cleared by Dr Dana Duncan. For mild to moderate pain: acetaminophen  (Tylenol ) and ice packs. Avoid NSAIDs (Motrin, Aleve) given dehydration and kidney injury after surgery Combining Tylenol  with alcohol  can substantially increase your risk of causing liver disease. Narcotic pain medications, if prescribed, can be used for severe pain, though may cause nausea, constipation, and drowsiness. Do not combine Tylenol  and Percocet (or similar) within a 6 hour period as Percocet (and similar) contain(s) Tylenol . If you do not need the narcotic pain medication, you do not need to fill the prescription.  Call office [phone] / [phone]) at any time if any questions, worsening pain, fevers/chills, bleeding, drainage from incision site, or other concerns.

## 2024-01-30 NOTE — Discharge Summary (Signed)
 [facility] SURGICAL DISCHARGE SUMMARY  Patient ID: Michael Rowe MRN: [id number] DOB/AGE: 84-17-1942 84 y.o.  Admit date: 01/27/2024 Discharge date: 01/30/2024  Discharge Diagnoses Patient Active Problem List   Diagnosis Date Noted   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/27/2024   Cancer of sigmoid (HCC) 01/27/2024   Colonic mass 01/16/2024   AKI (acute kidney injury) (HCC) 09/30/2017    Consultants None  Procedures 01/27/2024: 1. Robotic assisted Laparoscopic Low anterior resection 2. Robotic Laparoscopic takedown of splenic flexure 3. ICG perfusion check of pedicle graft for anastomosis  HPI: Michael Rowe is a 83 y.o. male with history of sigmoid colon cancer who presents to [facility] on 05/27 for scheduled surgery.   Hospital Course: Informed consent was obtained and documented, and patient underwent uneventful robotic assisted laparoscopic LAR (Dr Dana Duncan, 01/27/2024).  Post-operatively, patient did very well. He did develop AKI which responded well to resuscitation. Advancement of patient's diet and ambulation were well-tolerated. The remainder of patient's hospital course was essentially unremarkable, and discharge planning was initiated accordingly with patient safely able to be discharged home with appropriate discharge instructions, pain control, and outpatient follow-up after all of his questions were answered to his expressed satisfaction.   Discharge Condition: Good   Physical Examination:  Constitutional: alert, cooperative and no distress  Respiratory: breathing non-labored at rest  Cardiovascular: regular rate and sinus rhythm  Gastrointestinal: soft, non-tender, and non-distended, no rebound/guarding  Integumentary: Laparoscopic incisions are CDI with dermabond, no erythema or drainage     Allergies as of 01/30/2024   No Known Allergies      Medication List     PAUSE taking these medications    Xarelto  20 MG Tabs tablet Wait to  take this until: February 16, 2024 Generic drug: rivaroxaban  Take 1 tablet by mouth daily.       STOP taking these medications    metroNIDAZOLE 500 MG tablet Commonly known as: FLAGYL   neomycin 500 MG tablet Commonly known as: MYCIFRADIN   polyethylene glycol powder 17 GM/SCOOP powder Commonly known as: MiraLax       TAKE these medications    atorvastatin  40 MG tablet Commonly known as: LIPITOR Take 1 tablet (40 mg total) by mouth daily. Please call for an appointment for more refills   cyanocobalamin  1000 MCG tablet Commonly known as: VITAMIN B12 Take 1,000 mcg by mouth daily.   ferrous sulfate  325 (65 FE) MG EC tablet Take 1 tablet (325 mg total) by mouth 2 (two) times daily.   folic acid  1 MG tablet Commonly known as: FOLVITE  Take 1 mg by mouth daily.   furosemide  20 MG tablet Commonly known as: Lasix  Take 1 tablet (20 mg total) by mouth daily as needed (swelling).   Klor-Con  M20 20 MEQ tablet Generic drug: potassium chloride  SA Take 20 mEq by mouth daily.   metoprolol  tartrate 25 MG tablet Commonly known as: LOPRESSOR  Take 1 tablet (25 mg total) by mouth 2 (two) times daily.   mirtazapine 15 MG tablet Commonly known as: REMERON Take 1 tablet by mouth at bedtime.   oxyCODONE 5 MG immediate release tablet Commonly known as: Oxy IR/ROXICODONE Take 1-2 tablets (5-10 mg total) by mouth every 6 (six) hours as needed for severe pain (pain score 7-10) or breakthrough pain.   pantoprazole  40 MG tablet Commonly known as: Protonix  Take 1 tablet (40 mg total) by mouth 2 (two) times daily.          Follow-up Information  Alben Alma, MD. Caleen Catalan on 02/11/2024.   Specialty: General Surgery Why: Go to appointment on 06/11 at 300 PM Contact information: [address] [place] [address] [phone]                  Time spent on discharge management including discussion of hospital course, clinical condition, outpatient  instructions, prescriptions, and follow up with the patient and members of the medical team: >30 minutes  -- Apolonio Bay , PA-C  [facility]  01/30/2024, 8:14 AM [phone] M-F: 7am - 4pm

## 2024-01-31 LAB — BPAM RBC
Blood Product Expiration Date: 202506272359
Unit Type and Rh: 6200

## 2024-01-31 LAB — TYPE AND SCREEN
ABO/RH(D): A POS
Antibody Screen: NEGATIVE
Unit division: 0

## 2024-02-02 ENCOUNTER — Telehealth: Payer: Self-pay | Admitting: Surgery

## 2024-02-02 NOTE — Telephone Encounter (Signed)
 Path d/w pt in detail. He understands. He is doing very well w/o any issues

## 2024-02-11 ENCOUNTER — Ambulatory Visit (INDEPENDENT_AMBULATORY_CARE_PROVIDER_SITE_OTHER): Admitting: Surgery

## 2024-02-11 ENCOUNTER — Encounter: Payer: Self-pay | Admitting: Surgery

## 2024-02-11 ENCOUNTER — Other Ambulatory Visit: Payer: Self-pay | Admitting: *Deleted

## 2024-02-11 VITALS — BP 123/79 | HR 85 | Temp 97.9°F | Ht 70.5 in | Wt 213.0 lb

## 2024-02-11 DIAGNOSIS — Z08 Encounter for follow-up examination after completed treatment for malignant neoplasm: Secondary | ICD-10-CM

## 2024-02-11 DIAGNOSIS — Z09 Encounter for follow-up examination after completed treatment for conditions other than malignant neoplasm: Secondary | ICD-10-CM

## 2024-02-11 DIAGNOSIS — C187 Malignant neoplasm of sigmoid colon: Secondary | ICD-10-CM

## 2024-02-11 NOTE — Progress Notes (Signed)
 Michael Rowe is 2 weeks out following low anterior resection for sigmoid cancer.  Pathology discussed with him in detail.  Negative margins and negative nodes.  He is recovering well.  Tolerating diet having bowel movements.  He does have some intermittent diarrhea.  No fevers no chills he is ambulating, no bl;eeding  PE NAD Abd: soft, nontender incisions healing well without infections or hernias.  No peritonitis  A/P doing very well without complications.  Encouraged him to follow-up with oncology. From my perspective okay to restart anticoagulation.  They seem to be hesitant.  I encouraged them to discuss with cardiology and primary care regarding long-term anticoagulation. From surgical perspective not much to add.  He will need another colonoscopy in 1 year.  I will be happy to see him in a few months. They are very appreciative

## 2024-02-11 NOTE — Patient Instructions (Addendum)
 We will see you back in 3 months for a follow up  Please call the office if you have any questions or concerns

## 2024-02-23 DIAGNOSIS — Z1331 Encounter for screening for depression: Secondary | ICD-10-CM | POA: Diagnosis not present

## 2024-02-23 DIAGNOSIS — D509 Iron deficiency anemia, unspecified: Secondary | ICD-10-CM | POA: Diagnosis not present

## 2024-02-25 DIAGNOSIS — D509 Iron deficiency anemia, unspecified: Secondary | ICD-10-CM | POA: Diagnosis not present

## 2024-02-27 NOTE — Progress Notes (Unsigned)
 Cardiology Office Note  Date:  03/01/2024   ID:  Michael Rowe October 06, 1940, MRN [id number]  PCP:  Michael Lauraine Collar, NP   Chief Complaint  Patient presents with   New Patient (Initial Visit)    Ref by Lauraine Don, NP for dizziness, CHF and A-Fib. Patient c/o waking up 3-4 days ago with a terrible bruise on his left arm; the patient doesn't remember falling or anything.     HPI:   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 History of cardioversion November 14, 2017 Chronic lower extremity edema Last seen by myself March 2019 Chronic renal insufficiency Who presents by referral from Lauraine Michael for dizziness, chronic diastolic CHF, permanent A-fib  In follow-up today reports feeling relatively well Presenting to establish care for his chronic diastolic CHF, permanent atrial fibrillation  Lab work reviewed Hemoglobin 10.3  Creatinine 1.8, up from baseline 1.3 A1c 4.8  On Lasix  20 daily with potassium 20  Echocardiogram Jan 16, 2024 EF 55 to 60%, normal RV function, moderately dilated Moderately dilated left atrium, severely dilated right atrium  Surgery Jan 27, 2024 Underwent robotic assisted laparoscopic low anterior resection, takedown of splenic flexure, for sigmoid colon cancer Developed AKI  CT abdomen pelvis Jan 15, 2024 Mild diffuse aortic atherosclerosis  Orthostatics positive today, did not have much to drink this morning Rare lasix  use, last one month ago Blood pressure 118/70 supine, 112/76 sitting, 100/66 standing, 104/68 standing 3 minutes  Sedentary, has a treadmill  EKG personally reviewed by myself on todays visit EKG Interpretation Date/Time:  Monday March 01 2024 08:40:59 EDT Ventricular Rate:   80 PR Interval:    QRS Duration:  90 QT Interval:  416 QTC Calculation: 479 R Axis:   -15  Text Interpretation: Atrial fibrillation When compared with ECG of 13-Jan-2024 12:53, No significant change was found Confirmed by Perla Lye [phone]) on 03/01/2024 8:50:47 AM    PMH:   has a past medical history of ABLA (acute blood loss anemia) (02/27/2022), AKI (acute kidney injury) (HCC) (09/30/2017), Alcohol  abuse, Atrial fibrillation (HCC), Blood in stool (01/15/2024), Chronic diastolic heart failure (HCC) (97/77/7980), Colonic mass (01/16/2024), Depression, GI bleed (01/14/2024), HLD (hyperlipidemia), Hypertension, Hypomagnesemia (02/28/2022), Longstanding persistent atrial fibrillation (HCC) (01/15/2024), Macrocytic anemia, Morbid obesity (HCC), Neoplasm of digestive system (01/15/2024), and Symptomatic anemia (01/13/2024).  PSH:    Past Surgical History:  Procedure Laterality Date   CARDIOVERSION N/A 11/14/2017   Procedure: CARDIOVERSION;  Surgeon: Perla Lye PARAS, MD;  Location: [facility] ORS;  Service: Cardiovascular;  Laterality: N/A;   COLECTOMY, SIGMOID, ROBOT-ASSISTED N/A 01/27/2024   Procedure: COLECTOMY, SIGMOID, ROBOT-ASSISTED;  Surgeon: Jordis Laneta FALCON, MD;  Location: [facility] ORS;  Service: General;  Laterality: N/A;   COLONOSCOPY N/A 01/15/2024   Procedure: COLONOSCOPY;  Surgeon: Jinny Carmine, MD;  Location: [facility] ENDOSCOPY;  Service: Endoscopy;  Laterality: N/A;   ESOPHAGOGASTRODUODENOSCOPY N/A 01/14/2024   Procedure: EGD (ESOPHAGOGASTRODUODENOSCOPY);  Surgeon: Jinny Carmine, MD;  Location: [facility] ENDOSCOPY;  Service: Endoscopy;  Laterality: N/A;   ESOPHAGOGASTRODUODENOSCOPY (EGD) WITH PROPOFOL  N/A 02/28/2022   Procedure: ESOPHAGOGASTRODUODENOSCOPY (EGD) WITH PROPOFOL ;  Surgeon: Maryruth Ole DASEN, MD;  Location: [facility] ENDOSCOPY;  Service: Endoscopy;  Laterality: N/A;   KNEE SURGERY      Current Outpatient Medications  Medication Sig Dispense Refill   atorvastatin  (LIPITOR) 40 MG tablet  Take 1 tablet (40 mg total) by mouth daily. Please call for an appointment for more refills 30 tablet 0   cyanocobalamin  (VITAMIN B12) 1000 MCG tablet Take 1,000 mcg by mouth daily.     ferrous sulfate  325 (65 FE) MG EC tablet Take 1 tablet (325 mg total) by mouth 2 (two) times daily.     folic acid  (FOLVITE ) 1 MG tablet Take 1 mg by mouth daily.     furosemide  (LASIX ) 20 MG tablet Take 1 tablet (20 mg total) by mouth daily as needed (swelling). 90 tablet 1   KLOR-CON  M20 20 MEQ tablet Take 20 mEq by mouth daily.     metoprolol  tartrate (LOPRESSOR ) 25 MG tablet Take 1 tablet (25 mg total) by mouth 2 (two) times daily. 60 tablet 1   mirtazapine  (REMERON ) 15 MG tablet Take 1 tablet by mouth at bedtime.     oxyCODONE  (OXY IR/ROXICODONE ) 5 MG immediate release tablet Take 1-2 tablets (5-10 mg total) by mouth every 6 (six) hours as needed for severe pain (pain score 7-10) or breakthrough pain. 20 tablet 0   pantoprazole  (PROTONIX ) 40 MG tablet Take 1 tablet (40 mg total) by mouth 2 (two) times daily. 60 tablet 1   XARELTO  20 MG TABS tablet Take 1 tablet by mouth daily.     No current facility-administered medications for this visit.     Allergies:   Patient has no known allergies.   Social History:  The patient  reports that he has never smoked. He has never used smokeless tobacco. He reports current alcohol  use of about 4.0 - 5.0 standard drinks of alcohol  per week. He reports that he does not use drugs.   Family History:   family history includes CAD in his brother; Cancer in his brother, brother, and brother; Heart disease in his father; Liver disease in his mother.    Review of Systems: Review of Systems  Constitutional: Negative.   HENT: Negative.    Respiratory: Negative.    Cardiovascular: Negative.   Gastrointestinal: Negative.   Musculoskeletal: Negative.   Neurological: Negative.   Psychiatric/Behavioral: Negative.    All other systems reviewed and are negative.   PHYSICAL  EXAM: VS:  BP 112/76 (BP Location: Left Arm, Patient Position: Sitting, Cuff Size: Normal)   Pulse 80   Ht 5' 11 (1.803 m)   Wt 214 lb (97.1 kg)   SpO2 100%   BMI 29.85 kg/m  , BMI Body mass index is 29.85 kg/m. GEN: Well nourished, well developed, in no acute distress HEENT: normal Neck: no JVD, carotid bruits, or masses Cardiac: RRR; no murmurs, rubs, or gallops,no edema  Respiratory:  clear to auscultation bilaterally, normal work of breathing GI: soft, nontender, nondistended, + BS MS: no deformity or atrophy Skin: warm and dry, no rash Neuro:  Strength and sensation are intact Psych: euthymic mood, full affect  Recent Labs: 01/16/2024: Magnesium  2.1 01/23/2024: ALT 7 01/30/2024: BUN 14; Creatinine, Ser 1.80; Hemoglobin 8.2; Platelets 210; Potassium 3.9; Sodium 136   Lipid Panel Lab Results  Component Value Date   CHOL 174 11/06/2018   HDL 42 11/06/2018   LDLCALC 105 (H) 11/06/2018   TRIG 135 11/06/2018      Wt Readings from Last 3 Encounters:  03/01/24 214 lb (97.1 kg)  02/11/24 213 lb (96.6 kg)  01/27/24 209 lb (94.8 kg)     ASSESSMENT AND PLAN:  Problem List Items Addressed This Visit       Cardiology Problems  Chronic diastolic heart failure (HCC) (Chronic)   Relevant Orders   EKG 12-Lead (Completed)   Longstanding persistent atrial fibrillation (HCC) - Primary   Relevant Orders   EKG 12-Lead (Completed)   HTN (hypertension)   Relevant Orders   EKG 12-Lead (Completed)   HLD (hyperlipidemia)     Other   Symptomatic anemia   Morbid obesity (HCC)   Permanent atrial fibrillation Dating back over 20 years, Rate controlled on metoprolol  tartrate 25 twice daily, tolerating Xarelto  20 daily  Ecchymotic bruising left arm Has left shoulder discomfort, bruising left biceps region, left elbow, possible ligamental injury No further intervention needed  Hypotension Orthostatic positive, asymptomatic today but blood pressure down to 100 systolic on  standing BMP today, recommend to increase his fluids He has not taken Lasix  in over 1 month, recommended he take this sparingly as he is doing only for ankle swelling which he has none today  Chronic diastolic CHF Appears euvolemic if not prerenal given drop in pressure Lasix  as needed, no changes to medications given low blood pressure on standing Normal EF  Acute on chronic renal failure Creatinine 1.3, up to 1.9 during colon surgery Repeat BMP today, recommend to increase his fluids  Signed, Michael Rowe, M.D., Ph.D. [facility] [place], [place] [phone]

## 2024-03-01 ENCOUNTER — Ambulatory Visit: Attending: Cardiovascular Disease | Admitting: Cardiovascular Disease

## 2024-03-01 ENCOUNTER — Encounter: Payer: Self-pay | Admitting: Cardiovascular Disease

## 2024-03-01 VITALS — BP 112/76 | HR 80 | Ht 71.0 in | Wt 214.0 lb

## 2024-03-01 DIAGNOSIS — D649 Anemia, unspecified: Secondary | ICD-10-CM

## 2024-03-01 DIAGNOSIS — I10 Essential (primary) hypertension: Secondary | ICD-10-CM | POA: Diagnosis not present

## 2024-03-01 DIAGNOSIS — I4811 Longstanding persistent atrial fibrillation: Secondary | ICD-10-CM

## 2024-03-01 DIAGNOSIS — Z79899 Other long term (current) drug therapy: Secondary | ICD-10-CM | POA: Diagnosis not present

## 2024-03-01 DIAGNOSIS — E782 Mixed hyperlipidemia: Secondary | ICD-10-CM

## 2024-03-01 DIAGNOSIS — N179 Acute kidney failure, unspecified: Secondary | ICD-10-CM

## 2024-03-01 DIAGNOSIS — N1832 Chronic kidney disease, stage 3b: Secondary | ICD-10-CM | POA: Diagnosis not present

## 2024-03-01 DIAGNOSIS — I5032 Chronic diastolic (congestive) heart failure: Secondary | ICD-10-CM | POA: Diagnosis not present

## 2024-03-01 NOTE — Patient Instructions (Addendum)
 Increase fluid intake, Blood pressure is low  Medication Instructions:  No changes  If you need a refill on your cardiac medications before your next appointment, please call your pharmacy.   Lab work: [employer] today  Testing/Procedures: No new testing needed  Follow-Up: At [facility], you and your health needs are our priority.  As part of our continuing mission to provide you with exceptional heart care, we have created designated Provider Care Teams.  These Care Teams include your primary Cardiologist (physician) and Advanced Practice Providers (APPs -  Physician Assistants and Nurse Practitioners) who all work together to provide you with the care you need, when you need it.  You will need a follow up appointment in 6 months  Providers on your designated Care Team:   Lonni Meager, NP Bernardino Bring, PA-C Cadence [place], [place]  COVID-19 Vaccine Information can be found at: [email or web address] For questions related to vaccine distribution or appointments, please email [email or web address] or call 6312883980.

## 2024-03-02 LAB — BASIC METABOLIC PANEL WITH GFR
BUN/Creatinine Ratio: 12 (ref 10–24)
BUN: 15 mg/dL (ref 8–27)
CO2: 19 mmol/L — ABNORMAL LOW (ref 20–29)
Calcium: 9 mg/dL (ref 8.6–10.2)
Chloride: 104 mmol/L (ref 96–106)
Creatinine, Ser: 1.26 mg/dL (ref 0.76–1.27)
Glucose: 96 mg/dL (ref 70–99)
Potassium: 4.2 mmol/L (ref 3.5–5.2)
Sodium: 139 mmol/L (ref 134–144)
eGFR: 57 mL/min/{1.73_m2} — ABNORMAL LOW (ref >59)

## 2024-03-07 ENCOUNTER — Ambulatory Visit: Payer: Self-pay | Admitting: Cardiovascular Disease

## 2024-04-26 ENCOUNTER — Encounter: Admitting: Surgery

## 2024-05-26 ENCOUNTER — Ambulatory Visit: Admitting: Surgery

## 2024-05-26 ENCOUNTER — Encounter: Payer: Self-pay | Admitting: Surgery

## 2024-05-26 VITALS — BP 118/87 | HR 48 | Ht 71.0 in | Wt 198.0 lb

## 2024-05-26 DIAGNOSIS — C187 Malignant neoplasm of sigmoid colon: Secondary | ICD-10-CM | POA: Diagnosis not present

## 2024-05-26 NOTE — Patient Instructions (Addendum)
 We will send a referral to [facility] for them to keep up with you medically. They will call you to schedule this appointment.   You will be due for a Colonoscopy in May next year. We will see you in office after you have your Colonoscopy. We will send you a letter about this appointment.      Please call and ask to speak with a nurse if you develop questions or concerns.

## 2024-05-27 NOTE — Progress Notes (Signed)
 Outpatient Surgical Follow Up   Michael Rowe is an 83 y.o. male.   Chief Complaint  Patient presents with   Follow-up    HPI: Michael Rowe is 3 1/2 months out following low anterior resection for sigmoid cancer.  Pathology discussed with him in detail.  Negative margins and negative nodes.  He is recovering well.  Tolerating diet having bowel movements.  He quit drinking and has lost some weight but feels well  No fevers no chills he is ambulating, no bleeding . Never went to oncology NO bleeding   Past Medical History:  Diagnosis Date   ABLA (acute blood loss anemia) 02/27/2022   AKI (acute kidney injury) 09/30/2017   Alcohol  abuse    Atrial fibrillation (HCC)    a. patient reports being diagnosed with A fib ~ 15-20 years prior in [place], [place]; b. noted to be in Afib 1/19; c. 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   Symptomatic anemia 01/13/2024    Past Surgical History:  Procedure Laterality Date   CARDIOVERSION N/A 11/14/2017   Procedure: CARDIOVERSION;  Surgeon: Perla Evalene PARAS, MD;  Location: [facility] ORS;  Service: Cardiovascular;  Laterality: N/A;   COLECTOMY, SIGMOID, ROBOT-ASSISTED N/A 01/27/2024   Procedure: COLECTOMY, SIGMOID, ROBOT-ASSISTED;  Surgeon: Jordis Laneta FALCON, MD;  Location: [facility] ORS;  Service: General;  Laterality: N/A;   COLONOSCOPY N/A 01/15/2024   Procedure: COLONOSCOPY;  Surgeon: Jinny Carmine, MD;  Location: [facility] ENDOSCOPY;  Service: Endoscopy;  Laterality: N/A;   ESOPHAGOGASTRODUODENOSCOPY N/A 01/14/2024   Procedure: EGD (ESOPHAGOGASTRODUODENOSCOPY);  Surgeon: Jinny Carmine, MD;  Location[facility]  [facility] ENDOSCOPY;  Service: Endoscopy;  Laterality: N/A;   ESOPHAGOGASTRODUODENOSCOPY (EGD) WITH PROPOFOL  N/A 02/28/2022   Procedure: ESOPHAGOGASTRODUODENOSCOPY (EGD) WITH PROPOFOL ;  Surgeon: Maryruth Ole DASEN, MD;  Location: [facility] ENDOSCOPY;  Service: Endoscopy;  Laterality: N/A;   KNEE SURGERY      Family History  Problem Relation Age of Onset   Liver disease Mother    Heart disease Father    CAD Brother    Cancer Brother    Cancer Brother    Cancer Brother     Social History:  reports that he has never smoked. He has never been exposed to tobacco smoke. He has never used smokeless tobacco. He reports current alcohol  use of about 4.0 - 5.0 standard drinks of alcohol  per week. He reports that he does not use drugs.  Allergies: No Known Allergies  Medications reviewed.    ROS Full ROS performed and is otherwise negative other than what is stated in HPI   BP 118/87   Pulse (!) 48   Ht 5' 11 (1.803 m)   Wt 198 lb (89.8 kg)   SpO2 100%   BMI 27.62 kg/m   Physical Exam Vitals and nursing note reviewed. Exam conducted with a chaperone present.  Constitutional:      General: He is not in acute distress.    Appearance: Normal appearance. He is normal weight. He is not ill-appearing.  Cardiovascular:     Rate and Rhythm: Normal rate.  Pulmonary:     Effort: Pulmonary effort  is normal. No respiratory distress.     Breath sounds: Normal breath sounds.  Abdominal:     General: Abdomen is flat. There is no distension.     Palpations: Abdomen is soft. There is no mass.     Tenderness: There is no abdominal tenderness. There is no guarding or rebound.     Hernia: No hernia is present.     Comments: All incisions are healed   Musculoskeletal:     Cervical back: Normal range of motion and neck supple. No rigidity or tenderness.  Skin:    General: Skin is warm and dry.     Capillary Refill: Capillary refill takes less than 2 seconds.  Neurological:     General: No focal  deficit present.     Mental Status: He is alert.  Psychiatric:        Mood and Affect: Mood normal.        Behavior: Behavior normal.        Thought Content: Thought content normal.        Judgment: Judgment normal.      Assessment/Plan: Colorectal CA, s/p resection negative nodes Reiterated importance of oncology f/u and 1 year colonoscopy We will make sure we re arrange those for them RTC next year after colonoscopy I personally spent a total of 30 minutes in the care of the patient today including performing a medically appropriate exam/evaluation, counseling and educating, placing orders, referring and communicating with other health care professionals, documenting clinical information in the EHR, independently interpreting and reviewing images studies and coordinating care.   Laneta Luna, MD [facility] General Surgeon

## 2024-06-07 NOTE — Progress Notes (Unsigned)
 [facility] CONSULT NOTE  Patient Care Team: Gauger, Lauraine Collar, NP as PCP - General (Internal Medicine) Maurie Rayfield BIRCH, RN as Oncology Nurse Navigator Rennie Cindy SAUNDERS, MD as Consulting Physician (Oncology)  CHIEF COMPLAINTS/PURPOSE OF CONSULTATION: COLON CANCER  Oncology History Overview Note  1. COLON AND RECTUM, CARCINOMA:  Resection, Including Transanal Disk Excision of Rectal Neoplasms  Procedure: Resection, sigmoid colon Tumor Site: Sigmoid colon Tumor Size: 2 cm Macroscopic Tumor Perforation: Not identified Histologic Type: Adenocarcinoma Histologic Grade: G2: Moderately differentiated Multiple Primary Sites: Not applicable Tumor Extension: Carcinoma invades into the muscularis propria Lymphovascular Invasion: Not identified Perineural Invasion: Not identified Treatment Effect: No known presurgical therapy Margins: Margin Status for Invasive Carcinoma: All margins negative for invasive carcinoma Margin Status for Non-Invasive Tumor: All margins negative for high-grade dysplasia /intramucosal carcinoma and low-grade dysplasia Regional Lymph Nodes: Number of Lymph Nodes with Tumor: 0 Number of Lymph Nodes Examined: 16 Tumor Deposits: Not identified Distant Metastasis: Distant Site(s) Involved: Not applicable Pathologic Stage Classification (pTNM, AJCC 8th Edition): pT2, pN0    Cancer of sigmoid (HCC)  01/27/2024 Initial Diagnosis   Cancer of sigmoid (HCC)   06/07/2024 Cancer Staging   Staging form: Colon and Rectum, AJCC 8th Edition - Clinical: Stage I (cT2, cN0, cM0) - Signed by Rennie Cindy SAUNDERS, MD on 06/07/2024 Total positive nodes: 0     HISTORY OF PRESENTING ILLNESS: Patient ambulating-with a cane. Accompanied by wife.   Michael Rowe 83 y.o.  male pleasant patient In May, he was diagnosed with colon cancer after experiencing dizziness and an inability to walk, which led to the discovery of a low blood count.    Colonoscopy subsequent revealed malignancy patient underwent surgery was performed to remove it. The patient reports that the surgeon told him the cancer was not extensive and that he would not need a colostomy bag. He feels much better post-surgery, with improved mobility and no longer experiencing shortness of breath. His hemoglobin was previously as low as 5.7, contributing to his symptoms of dizziness and weakness.  He has a history of atrial fibrillation and is currently taking Xarelto , a blood thinner, to prevent strokes. He has experienced easy bruising and bleeding, even before starting the blood thinner. He has had irregular heartbeats for a long time, which have not caused any significant problems in the past.   His blood counts and kidney function were abnormal at the time of his cancer diagnosis.  He uses a mobility aid due to a dropped foot but is otherwise able to walk without running out of breath. He has stumbled a few times but is cautious due to the risk of bleeding from falls while on blood thinners. No frequent epistaxis, gum bleeding, or bleeding after surgeries in the past.  Review of Systems  Constitutional:  Negative for chills, diaphoresis, fever, malaise/fatigue and weight loss.  HENT:  Negative for nosebleeds and sore throat.   Eyes:  Negative for double vision.  Respiratory:  Negative for cough, hemoptysis, sputum production, shortness of breath and wheezing.   Cardiovascular:  Negative for chest pain, palpitations, orthopnea and leg swelling.  Gastrointestinal:  Negative for abdominal pain, blood in stool, constipation, diarrhea, heartburn, melena, nausea and vomiting.  Genitourinary:  Negative for dysuria, frequency and urgency.  Musculoskeletal:  Negative for back pain and joint pain.  Skin: Negative.  Negative for itching and rash.  Neurological:  Negative for dizziness, tingling, focal weakness, weakness and headaches.  Endo/Heme/Allergies:  Does not  bruise/bleed easily.  Psychiatric/Behavioral:  Negative for depression. The patient is not nervous/anxious and does not have insomnia.     MEDICAL HISTORY:  Past Medical History:  Diagnosis Date   ABLA (acute blood loss anemia) 02/27/2022   AKI (acute kidney injury) 09/30/2017   Alcohol  abuse    Atrial fibrillation (HCC)    a. patient reports being diagnosed with A fib ~ 15-20 years prior in [place], [place]; b. noted to be in Afib 1/19; c. 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   Symptomatic anemia 01/13/2024    SURGICAL HISTORY: Past Surgical History:  Procedure Laterality Date   CARDIOVERSION N/A 11/14/2017   Procedure: CARDIOVERSION;  Surgeon: Perla Evalene PARAS, MD;  Location: [facility] ORS;  Service: Cardiovascular;  Laterality: N/A;   COLECTOMY, SIGMOID, ROBOT-ASSISTED N/A 01/27/2024   Procedure: COLECTOMY, SIGMOID, ROBOT-ASSISTED;  Surgeon: Jordis Laneta FALCON, MD;  Location: [facility] ORS;  Service: General;  Laterality: N/A;   COLONOSCOPY N/A 01/15/2024   Procedure: COLONOSCOPY;  Surgeon: Jinny Carmine, MD;  Location: [facility] ENDOSCOPY;  Service: Endoscopy;  Laterality: N/A;   ESOPHAGOGASTRODUODENOSCOPY N/A 01/14/2024   Procedure: EGD (ESOPHAGOGASTRODUODENOSCOPY);  Surgeon: Jinny Carmine, MD;  [facility] ENDOSCOPY;  Service: Endoscopy;  Laterality: N/A;   ESOPHAGOGASTRODUODENOSCOPY (EGD) WITH PROPOFOL  N/A 02/28/2022   Procedure: ESOPHAGOGASTRODUODENOSCOPY (EGD) WITH PROPOFOL ;  Surgeon: Maryruth Ole DASEN, MD;  Location: [facility] ENDOSCOPY;  Service: Endoscopy;  Laterality: N/A;   KNEE SURGERY      SOCIAL  HISTORY: Social History   Socioeconomic History   Marital status: Married    Spouse name: Madeline   Number of children: 3   Years of education: college   Highest education level: Bachelor's degree (e.g., BA, AB, BS)  Occupational History   Not on file  Tobacco Use   Smoking status: Never    Passive exposure: Never   Smokeless tobacco: Never  Vaping Use   Vaping status: Never Used  Substance and Sexual Activity   Alcohol  use: Not Currently    Alcohol /week: 4.0 - 5.0 standard drinks of alcohol     Types: 3 - 4 Cans of beer, 1 Shots of liquor per week    Comment: last drink yesterday morning 02/27/22   Drug use: No   Sexual activity: Never  Other Topics Concern   Not on file  Social History Narrative   Not on file   Social Drivers of Health   Financial Resource Strain: Low Risk  (02/23/2024)   Received from [facility]   Overall Financial Resource Strain (CARDIA)    Difficulty of Paying Living Expenses: Not hard at all  Food Insecurity: No Food Insecurity (06/09/2024)   Hunger Vital Sign    Worried About Running Out of Food in the Last Year: Never true    Ran Out of Food in the Last Year: Never true  Transportation Needs: No Transportation Needs (06/09/2024)   PRAPARE - Administrator, Civil Service (Medical): No    Lack of Transportation (Non-Medical): No  Physical Activity: Inactive (10/24/2017)   Exercise Vital Sign    Days of  Exercise per Week: 0 days    Minutes of Exercise per Session: 0 min  Stress: No Stress Concern Present (10/24/2017)   [employer] of Occupational Health - Occupational Stress Questionnaire    Feeling of Stress : Not at all  Social Connections: Moderately Isolated (01/27/2024)   Social Connection and Isolation Panel    Frequency of Communication with Friends and Family: Three times a week    Frequency of Social Gatherings with Friends and Family: Three times a week    Attends Religious Services: Never    Active  Member of Clubs or Organizations: No    Attends Banker Meetings: Never    Marital Status: Married  Catering manager Violence: Not At Risk (06/09/2024)   Humiliation, Afraid, Rape, and Kick questionnaire    Fear of Current or Ex-Partner: No    Emotionally Abused: No    Physically Abused: No    Sexually Abused: No    FAMILY HISTORY: Family History  Problem Relation Age of Onset   Liver disease Mother    Heart disease Father    CAD Brother    Cancer Brother    Cancer Brother    Cancer Brother     ALLERGIES:  has no known allergies.  MEDICATIONS:  Current Outpatient Medications  Medication Sig Dispense Refill   atorvastatin  (LIPITOR) 40 MG tablet Take 1 tablet (40 mg total) by mouth daily. Please call for an appointment for more refills 30 tablet 0   cyanocobalamin  (VITAMIN B12) 1000 MCG tablet Take 1,000 mcg by mouth daily.     ferrous sulfate  325 (65 FE) MG EC tablet Take 1 tablet (325 mg total) by mouth 2 (two) times daily.     folic acid  (FOLVITE ) 1 MG tablet Take 1 mg by mouth daily.     furosemide  (LASIX ) 20 MG tablet Take 1 tablet (20 mg total) by mouth daily as needed (swelling). 90 tablet 1   KLOR-CON  M20 20 MEQ tablet Take 20 mEq by mouth daily.     metoprolol  tartrate (LOPRESSOR ) 25 MG tablet Take 1 tablet (25 mg total) by mouth 2 (two) times daily. 60 tablet 1   mirtazapine  (REMERON ) 15 MG tablet Take 1 tablet by mouth at bedtime.     pantoprazole  (PROTONIX ) 40 MG tablet Take 1 tablet (40 mg total) by mouth 2 (two) times daily. 60 tablet 1   XARELTO  20 MG TABS tablet Take 1 tablet by mouth daily.     No current facility-administered medications for this visit.    PHYSICAL EXAMINATION:   Vitals:   06/09/24 1122  BP: 106/73  Pulse: 86  Temp: (!) 96.6 F (35.9 C)  SpO2: 99%   Filed Weights   06/09/24 1122  Weight: 196 lb 8 oz (89.1 kg)    Physical Exam Vitals and nursing note reviewed.  HENT:     Head: Normocephalic and atraumatic.      Mouth/Throat:     Pharynx: Oropharynx is clear.  Eyes:     Extraocular Movements: Extraocular movements intact.     Pupils: Pupils are equal, round, and reactive to light.  Cardiovascular:     Rate and Rhythm: Normal rate and regular rhythm.  Pulmonary:     Comments: Decreased breath sounds bilaterally.  Abdominal:     Palpations: Abdomen is soft.  Musculoskeletal:        General: Normal range of motion.     Cervical back: Normal range of motion.  Skin:    General: Skin is  warm.  Neurological:     General: No focal deficit present.     Mental Status: He is alert and oriented to person, place, and time.  Psychiatric:        Behavior: Behavior normal.        Judgment: Judgment normal.     LABORATORY DATA:  I have reviewed the data as listed Lab Results  Component Value Date   WBC 6.9 01/30/2024   HGB 8.2 (L) 01/30/2024   HCT 26.3 (L) 01/30/2024   MCV 79.0 (L) 01/30/2024   PLT 210 01/30/2024   Recent Labs    01/13/24 1255 01/14/24 0615 01/16/24 9386 01/23/24 1148 01/28/24 0553 01/29/24 0526 01/30/24 0609 03/01/24 0918  NA 136   < > 137 137 135 130* 136 139  K 3.8   < > 4.2 4.6 4.4 3.8 3.9 4.2  CL 103   < > 108 105 107 101 107 104  CO2 22   < > 23 23 20* 21* 23 19*  GLUCOSE 134*   < > 102* 98 151* 95 88 96  BUN 35*   < > 14 15 13 14 14 15   CREATININE 1.75*   < > 1.31* 1.29* 1.55* 1.99* 1.80* 1.26  CALCIUM  8.7*   < > 7.7* 8.9 8.0* 7.7* 8.1* 9.0  GFRNONAA 38*   < > 54* 55* 44* 33* 37*  --   PROT 6.1*  --  5.2* 5.6*  --   --   --   --   ALBUMIN  3.3*  --  2.7* 3.3*  --   --   --   --   AST 17  --  17 17  --   --   --   --   ALT 7  --  6 7  --   --   --   --   ALKPHOS 42  --  38 41  --   --   --   --   BILITOT 1.6*  --  1.6* 1.3*  --   --   --   --    < > = values in this interval not displayed.    RADIOGRAPHIC STUDIES: I have personally reviewed the radiological images as listed and agreed with the findings in the report. No results found.   Cancer of  sigmoid (HCC) # [facility] anemia CT SCAN: History of sigmoid colon mass on colonoscopy- s/p-resection- Stage I sigmoid colon cancer (resected).  Discussed regarding the excellent prognosis of Stage I sigmoid colon cancer resected successfully. 90% chance of being cancer-free. No further oncological treatment needed. - Follow up with family doctor for routine care and monitoring.  # Atrial fibrillation on chronic anticoagulation- Chronic atrial fibrillation managed with Xarelto  for stroke prevention. Anticoagulation necessary to prevent stroke. Benefits outweigh bleeding risks.  As per cardiology - Continue Xarelto  for anticoagulation.  # CT scan May 2025 incidental 2. 2 cm indeterminate exophytic lesion off the mid to lower left kidney, slightly increased compared to prior measured of 13 mm.  Patient will need further workup with an MRI with and without contrast.  Will make a referral to urology for further workup/management.   # History of iron  deficient anemia secondary to #1-patient is currently on oral iron .  Patient states that he is going to get his blood work done with his PCP soon.  I think is reasonable.  Will follow-up with us  as needed.  Thank you Dr. Pabone MD. for allowing me  to participate in the care of your pleasant patient. Please do not hesitate to contact me with questions or concerns in the interim.  # DISPOSITION: # no labs- today # follow up as needed- Dr.B  Called patient regarding urology referral-unable to leave a voicemail.  Left a voicemail for the wife-regarding a urology referral for the kidney lesion.  Please make a referral to [facility] urology. Re: left kidney mass  Dr.Tosch-     Cindy JONELLE Joe, MD 06/09/2024 12:28 PM

## 2024-06-07 NOTE — Assessment & Plan Note (Signed)
 MPRESSION: 1. Diverticular disease of the colon. Focal wall thickening with some surrounding stranding at the sigmoid colon, this presumably corresponds to the history of sigmoid colon mass on colonoscopy, correlate with biopsy results. Focal diverticulitis is included in the differential as it could also produce this imaging appearance. There is no evidence for perforation or abscess. 2. 2 cm indeterminate exophytic lesion off the mid to lower left kidney, slightly increased compared to prior measured of 13 mm. When the patient is clinically stable and able to follow directions and hold their breath (preferably as an outpatient) further evaluation with dedicated abdominal MRI should be considered.  1. COLON AND RECTUM, CARCINOMA:  Resection, Including Transanal Disk Excision of Rectal Neoplasms  Procedure: Resection, sigmoid colon  [Dr.PabonROCEDURES: 1. Robotic assisted Laparoscopic Low anterior resection 2. Robotic Laparoscopic takedown of splenic flexure 3. ICG perfusion check of pedicle graft for anastomosis]

## 2024-06-09 ENCOUNTER — Inpatient Hospital Stay: Attending: Internal Medicine | Admitting: Internal Medicine

## 2024-06-09 ENCOUNTER — Encounter: Payer: Self-pay | Admitting: Internal Medicine

## 2024-06-09 ENCOUNTER — Telehealth: Payer: Self-pay | Admitting: Internal Medicine

## 2024-06-09 ENCOUNTER — Other Ambulatory Visit: Payer: Self-pay | Admitting: *Deleted

## 2024-06-09 ENCOUNTER — Inpatient Hospital Stay

## 2024-06-09 ENCOUNTER — Encounter: Payer: Self-pay | Admitting: Urology

## 2024-06-09 VITALS — BP 106/73 | HR 86 | Temp 96.6°F | Ht 66.5 in | Wt 196.5 lb

## 2024-06-09 DIAGNOSIS — I4891 Unspecified atrial fibrillation: Secondary | ICD-10-CM | POA: Insufficient documentation

## 2024-06-09 DIAGNOSIS — Z79899 Other long term (current) drug therapy: Secondary | ICD-10-CM | POA: Insufficient documentation

## 2024-06-09 DIAGNOSIS — Z809 Family history of malignant neoplasm, unspecified: Secondary | ICD-10-CM | POA: Insufficient documentation

## 2024-06-09 DIAGNOSIS — C187 Malignant neoplasm of sigmoid colon: Secondary | ICD-10-CM | POA: Insufficient documentation

## 2024-06-09 DIAGNOSIS — I10 Essential (primary) hypertension: Secondary | ICD-10-CM | POA: Insufficient documentation

## 2024-06-09 DIAGNOSIS — E785 Hyperlipidemia, unspecified: Secondary | ICD-10-CM | POA: Diagnosis not present

## 2024-06-09 DIAGNOSIS — N2889 Other specified disorders of kidney and ureter: Secondary | ICD-10-CM | POA: Diagnosis not present

## 2024-06-09 DIAGNOSIS — R531 Weakness: Secondary | ICD-10-CM | POA: Diagnosis not present

## 2024-06-09 DIAGNOSIS — Z7901 Long term (current) use of anticoagulants: Secondary | ICD-10-CM | POA: Insufficient documentation

## 2024-06-09 DIAGNOSIS — I509 Heart failure, unspecified: Secondary | ICD-10-CM | POA: Diagnosis not present

## 2024-06-09 NOTE — Progress Notes (Signed)
 Pt had a colon resection on 01/27/24. Doing well. Does not have much of an appetite per se. Denies nausea, abd pain or nausea.

## 2024-06-09 NOTE — Telephone Encounter (Signed)
 Please make a referral to [facility] urology. Re: left kidney mass

## 2024-06-10 ENCOUNTER — Encounter: Payer: Self-pay | Admitting: Internal Medicine

## 2024-07-13 ENCOUNTER — Ambulatory Visit: Admitting: Urology

## 2024-07-23 ENCOUNTER — Ambulatory Visit: Admitting: Urology

## 2024-07-23 ENCOUNTER — Encounter: Payer: Self-pay | Admitting: Urology

## 2024-07-23 VITALS — BP 130/80 | HR 74 | Ht 71.0 in | Wt 198.0 lb

## 2024-07-23 DIAGNOSIS — N2889 Other specified disorders of kidney and ureter: Secondary | ICD-10-CM

## 2024-07-23 LAB — MICROSCOPIC EXAMINATION
Bacteria, UA: NONE SEEN
WBC, UA: NONE SEEN /HPF (ref 0–5)

## 2024-07-23 LAB — URINALYSIS, COMPLETE
Bilirubin, UA: NEGATIVE
Glucose, UA: NEGATIVE
Ketones, UA: NEGATIVE
Leukocytes,UA: NEGATIVE
Nitrite, UA: NEGATIVE
Protein,UA: NEGATIVE
RBC, UA: NEGATIVE
Specific Gravity, UA: 1.01 (ref 1.005–1.030)
Urobilinogen, Ur: 0.2 mg/dL (ref 0.2–1.0)
pH, UA: 6 (ref 5.0–7.5)

## 2024-07-23 NOTE — Patient Instructions (Signed)
 Scheduling number: [phone]

## 2024-07-23 NOTE — Progress Notes (Signed)
 07/23/2024 10:59 AM   Zachary Sharper Majano March 15, 1941 [id number]  Referring provider: Don Lauraine Collar, NP [address] [place],  [place] [address]  Chief Complaint  Patient presents with   Other    mass    HPI: Michael Rowe is a 83 y.o. male referred for evaluation of a renal mass.  His spouse was with him today  Incidentally found to have a 2 cm exophytic mass left kidney May 2025 which was considered indeterminant.  Scan was performed after recent diagnosis of colon cancer No flank, abdominal or pelvic pain Denies gross hematuria Baseline urinary frequency  PMH: Past Medical History:  Diagnosis Date   ABLA (acute blood loss anemia) 02/27/2022   AKI (acute kidney injury) 09/30/2017   Alcohol  abuse    Atrial fibrillation (HCC)    a. patient reports being diagnosed with A fib ~ 15-20 years prior in [place], [place]; b. noted to be in Afib 1/19; c. 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   Symptomatic anemia 01/13/2024    Surgical History: Past Surgical History:  Procedure Laterality Date   CARDIOVERSION N/A 11/14/2017   Procedure: CARDIOVERSION;  Surgeon: Perla Evalene PARAS, MD;  Location: [facility] ORS;  Service: Cardiovascular;  Laterality: N/A;   COLECTOMY, SIGMOID, ROBOT-ASSISTED N/A 01/27/2024   Procedure: COLECTOMY, SIGMOID, ROBOT-ASSISTED;  Surgeon: Jordis Laneta FALCON, MD;  Location: [facility] ORS;  Service: General;  Laterality: N/A;   COLONOSCOPY N/A 01/15/2024   Procedure: COLONOSCOPY;  Surgeon: Jinny Carmine, MD;  Location: [facility] ENDOSCOPY;  Service: Endoscopy;  Laterality: N/A;    ESOPHAGOGASTRODUODENOSCOPY N/A 01/14/2024   Procedure: EGD (ESOPHAGOGASTRODUODENOSCOPY);  Surgeon: Jinny Carmine, MD;  Location[facility] ENDOSCOPY;  Service: Endoscopy;  Laterality: N/A;   ESOPHAGOGASTRODUODENOSCOPY (EGD) WITH PROPOFOL  N/A 02/28/2022   Procedure: ESOPHAGOGASTRODUODENOSCOPY (EGD) WITH PROPOFOL ;  Surgeon: Maryruth Ole DASEN, MD;  Location: [facility] ENDOSCOPY;  Service: Endoscopy;  Laterality: N/A;   KNEE SURGERY      Home Medications:  Allergies as of 07/23/2024   No Known Allergies      Medication List        Accurate as of July 23, 2024 10:59 AM. If you have any questions, ask your nurse or doctor.          atorvastatin  40 MG tablet Commonly known as: LIPITOR Take 1 tablet (40 mg total) by mouth daily. Please call for an appointment for more refills   cyanocobalamin  1000 MCG tablet Commonly known as: VITAMIN B12 Take 1,000 mcg by mouth daily.   ferrous sulfate  325 (65 FE) MG EC tablet Take 1 tablet (325 mg total) by mouth 2 (two) times daily.   folic acid  1 MG tablet Commonly known as: FOLVITE  Take 1 mg by mouth daily.   furosemide  20 MG tablet Commonly known as: Lasix  Take 1 tablet (20 mg total) by mouth daily as needed (swelling).   Klor-Con  M20 20 MEQ tablet Generic drug: potassium chloride  SA Take 20 mEq by mouth daily.   metoprolol  tartrate 25 MG tablet Commonly known as: LOPRESSOR  Take 1 tablet (25 mg total) by  mouth 2 (two) times daily.   mirtazapine  15 MG tablet Commonly known as: REMERON  Take 1 tablet by mouth at bedtime.   pantoprazole  40 MG tablet Commonly known as: Protonix  Take 1 tablet (40 mg total) by mouth 2 (two) times daily.   Xarelto  20 MG Tabs tablet Generic drug: rivaroxaban  Take 1 tablet by mouth daily.        Allergies: No Known Allergies  Family History: Family History  Problem Relation Age of Onset   Liver disease Mother    Heart disease Father    CAD Brother    Cancer Brother    Cancer Brother    Cancer  Brother     Social History:  reports that he has never smoked. He has never been exposed to tobacco smoke. He has never used smokeless tobacco. He reports that he does not currently use alcohol  after a past usage of about 4.0 - 5.0 standard drinks of alcohol  per week. He reports that he does not use drugs.   Physical Exam: BP 130/80   Pulse 74   Ht 5' 11 (1.803 m)   Wt 198 lb (89.8 kg)   BMI 27.62 kg/m   Constitutional:  Alert, No acute distress. HEENT: [place] AT Respiratory: Normal respiratory effort, no increased work of breathing. Psychiatric: Normal mood and affect.  Laboratory Data:  Urinalysis Dipstick/microscopy negative   Pertinent Imaging: CT abdomen pelvis with contrast 01/15/2024 was personally reviewed and interpreted.  Agree with radiology interpretation.  Mass was present on a prior scan 2023 and measured 13 mm    Assessment & Plan:    1. Left renal mass 2 cm indeterminate left renal mass; probable renal cyst Recommend renal mass protocol MRI for further evaluation.  Order placed and they will be notified with the results   Glendia JAYSON Barba, MD  [facility] [address], [address] [place], [place] [address] [phone]

## 2024-07-30 ENCOUNTER — Ambulatory Visit: Admission: RE | Admit: 2024-07-30 | Source: Ambulatory Visit

## 2024-08-09 ENCOUNTER — Ambulatory Visit
Admission: RE | Admit: 2024-08-09 | Discharge: 2024-08-09 | Disposition: A | Source: Ambulatory Visit | Attending: Urology | Admitting: Urology

## 2024-08-09 DIAGNOSIS — K5730 Diverticulosis of large intestine without perforation or abscess without bleeding: Secondary | ICD-10-CM | POA: Diagnosis not present

## 2024-08-09 DIAGNOSIS — N2889 Other specified disorders of kidney and ureter: Secondary | ICD-10-CM

## 2024-08-09 DIAGNOSIS — N281 Cyst of kidney, acquired: Secondary | ICD-10-CM | POA: Diagnosis not present

## 2024-08-09 DIAGNOSIS — D1809 Hemangioma of other sites: Secondary | ICD-10-CM | POA: Diagnosis not present

## 2024-08-09 DIAGNOSIS — K7689 Other specified diseases of liver: Secondary | ICD-10-CM | POA: Diagnosis not present

## 2024-08-09 MED ORDER — GADOBUTROL 1 MMOL/ML IV SOLN
9.0000 mL | Freq: Once | INTRAVENOUS | Status: AC | PRN
  Administered 2024-08-09: 9 mL via INTRAVENOUS

## 2024-08-13 ENCOUNTER — Ambulatory Visit: Payer: Self-pay | Admitting: Urology
